# Patient Record
Sex: Female | Born: 1955 | Race: White | Hispanic: No | Smoking: Current every day smoker
Health system: Southern US, Community
[De-identification: ages and names within clinical notes are randomized; demographics above are authoritative.]

## PROBLEM LIST (undated history)

## (undated) DIAGNOSIS — I10 Essential (primary) hypertension: Secondary | ICD-10-CM

## (undated) DIAGNOSIS — Z9981 Dependence on supplemental oxygen: Secondary | ICD-10-CM

## (undated) DIAGNOSIS — F419 Anxiety disorder, unspecified: Secondary | ICD-10-CM

## (undated) DIAGNOSIS — M722 Plantar fascial fibromatosis: Secondary | ICD-10-CM

## (undated) DIAGNOSIS — E785 Hyperlipidemia, unspecified: Secondary | ICD-10-CM

## (undated) DIAGNOSIS — J449 Chronic obstructive pulmonary disease, unspecified: Secondary | ICD-10-CM

## (undated) DIAGNOSIS — G894 Chronic pain syndrome: Secondary | ICD-10-CM

## (undated) DIAGNOSIS — M1990 Unspecified osteoarthritis, unspecified site: Secondary | ICD-10-CM

## (undated) DIAGNOSIS — E039 Hypothyroidism, unspecified: Secondary | ICD-10-CM

## (undated) HISTORY — DX: Essential (primary) hypertension: I10

## (undated) HISTORY — PX: FOOT SURGERY: SHX648

## (undated) HISTORY — PX: SHOULDER SURGERY: SHX246

## (undated) HISTORY — PX: DILATION AND CURETTAGE OF UTERUS: SHX78

## (undated) HISTORY — DX: Plantar fascial fibromatosis: M72.2

## (undated) HISTORY — PX: RHINOPLASTY: SUR1284

## (undated) HISTORY — DX: Unspecified osteoarthritis, unspecified site: M19.90

## (undated) HISTORY — DX: Hyperlipidemia, unspecified: E78.5

## (undated) HISTORY — PX: TUBAL LIGATION: SHX77

## (undated) HISTORY — DX: Hypothyroidism, unspecified: E03.9

## (undated) HISTORY — PX: OTHER SURGICAL HISTORY: SHX169

## (undated) HISTORY — DX: Anxiety disorder, unspecified: F41.9

## (undated) HISTORY — DX: Chronic obstructive pulmonary disease, unspecified: J44.9

---

## 2005-02-02 ENCOUNTER — Emergency Department: Payer: Self-pay | Admitting: Emergency Medicine

## 2005-03-12 ENCOUNTER — Emergency Department: Payer: Self-pay | Admitting: Internal Medicine

## 2010-03-06 ENCOUNTER — Ambulatory Visit: Payer: Self-pay

## 2011-03-27 ENCOUNTER — Ambulatory Visit: Payer: Self-pay

## 2012-04-01 ENCOUNTER — Ambulatory Visit: Payer: Self-pay | Admitting: Family Medicine

## 2012-12-02 ENCOUNTER — Ambulatory Visit: Payer: Self-pay | Admitting: Anesthesiology

## 2012-12-03 ENCOUNTER — Ambulatory Visit: Payer: Self-pay | Admitting: Otolaryngology

## 2013-02-23 ENCOUNTER — Ambulatory Visit (INDEPENDENT_AMBULATORY_CARE_PROVIDER_SITE_OTHER): Payer: BC Managed Care – PPO | Admitting: Podiatry

## 2013-02-23 ENCOUNTER — Encounter: Payer: Self-pay | Admitting: Podiatry

## 2013-02-23 ENCOUNTER — Ambulatory Visit (INDEPENDENT_AMBULATORY_CARE_PROVIDER_SITE_OTHER): Payer: BC Managed Care – PPO

## 2013-02-23 VITALS — BP 121/76 | HR 78 | Resp 16 | Ht 64.0 in | Wt 173.0 lb

## 2013-02-23 DIAGNOSIS — M79671 Pain in right foot: Secondary | ICD-10-CM

## 2013-02-23 DIAGNOSIS — R609 Edema, unspecified: Secondary | ICD-10-CM

## 2013-02-23 DIAGNOSIS — M79609 Pain in unspecified limb: Secondary | ICD-10-CM

## 2013-02-23 DIAGNOSIS — M779 Enthesopathy, unspecified: Secondary | ICD-10-CM

## 2013-02-23 MED ORDER — TRIAMCINOLONE ACETONIDE 10 MG/ML IJ SUSP
5.0000 mg | Freq: Once | INTRAMUSCULAR | Status: AC
  Administered 2013-02-23: 5 mg via INTRA_ARTICULAR

## 2013-02-23 NOTE — Progress Notes (Signed)
Subjective:     Patient ID: Audrey Campbell, female   DOB: 04-08-1956, 57 y.o.   MRN: [id number]  HPI patient presents stating my feet ache after I work and I have trouble walking on cement floors. Patient states the heels do not feel like a used to but her feet ache in general and her ankles are also hurting   Review of Systems  All other systems reviewed and are negative.       Objective:   Physical Exam  Nursing note and vitals reviewed. Constitutional: She is oriented to person, place, and time.  Cardiovascular: Intact distal pulses.   Musculoskeletal: Normal range of motion.  Neurological: She is oriented to person, place, and time.  Skin: Skin is warm.   range of motion was adequate with minimal discomfort at the medial calcaneal tubercle insertion to the calcaneus. Incision sites have healed well and the pain is more generalized with specific pain within the sinus tarsi bilateral     Assessment:     Continue discomfort post endoscopic release with improvement around the insertional point of the calcaneus    Plan:     X-rays reviewed with patient and continuation of meds encouraged. Sterile prep and injection of the sinus tarsi bilateral 3 mg Kenalog 5 mg Xylocaine Marcaine mixture and I dispensed fascial wraps to use for the next 6 weeks. Reappoint at that time

## 2013-02-26 ENCOUNTER — Telehealth: Payer: Self-pay | Admitting: *Deleted

## 2013-02-26 MED ORDER — HYDROCODONE-ACETAMINOPHEN 10-325 MG PO TABS: 1.0000 | ORAL_TABLET | Freq: Three times a day (TID) | ORAL | Status: DC | PRN

## 2013-02-26 NOTE — Telephone Encounter (Signed)
PT LEFT VOICEMAIL STATING THAT SHE CANT GET HER RX REFILL BECAUSE OF THE NEW LAWS , SHE NEEDS A HARD COPY TO BRING TO THE PHARMACIST . IT IS FOR VICODIN 10/325 . RX WAS WRITTEN ON 9.26.14 WITH A REFILL. PLEASE WRITE A HARD COPY FOR HER TO TAKE TO THE PHARMACY .

## 2013-02-28 NOTE — Telephone Encounter (Signed)
Taken care of on friday

## 2013-04-02 ENCOUNTER — Ambulatory Visit: Payer: BC Managed Care – PPO | Admitting: Podiatry

## 2013-05-03 ENCOUNTER — Ambulatory Visit: Payer: Self-pay | Admitting: Family Medicine

## 2013-07-21 ENCOUNTER — Ambulatory Visit: Payer: Self-pay | Admitting: Family Medicine

## 2014-05-18 ENCOUNTER — Ambulatory Visit: Payer: Self-pay

## 2015-03-22 ENCOUNTER — Ambulatory Visit: Payer: Self-pay | Attending: Oncology

## 2015-04-12 ENCOUNTER — Ambulatory Visit: Payer: Self-pay

## 2015-06-12 ENCOUNTER — Other Ambulatory Visit: Payer: Self-pay | Admitting: Family Medicine

## 2015-06-12 DIAGNOSIS — Z1231 Encounter for screening mammogram for malignant neoplasm of breast: Secondary | ICD-10-CM

## 2015-06-15 ENCOUNTER — Ambulatory Visit
Admission: RE | Admit: 2015-06-15 | Discharge: 2015-06-15 | Disposition: A | Discharge status: Home / Self Care | Payer: BLUE CROSS/BLUE SHIELD | Source: Ambulatory Visit | Attending: Family Medicine | Admitting: Family Medicine

## 2015-06-15 DIAGNOSIS — Z1231 Encounter for screening mammogram for malignant neoplasm of breast: Secondary | ICD-10-CM

## 2015-07-27 ENCOUNTER — Ambulatory Visit
Admission: RE | Admit: 2015-07-27 | Discharge: 2015-07-27 | Disposition: A | Discharge status: Home / Self Care | Payer: BLUE CROSS/BLUE SHIELD | Source: Ambulatory Visit | Attending: Family Medicine | Admitting: Family Medicine

## 2015-07-27 ENCOUNTER — Other Ambulatory Visit: Payer: Self-pay | Admitting: Family Medicine

## 2015-07-27 DIAGNOSIS — R059 Cough, unspecified: Secondary | ICD-10-CM

## 2015-07-27 DIAGNOSIS — R05 Cough: Secondary | ICD-10-CM | POA: Diagnosis not present

## 2015-07-27 DIAGNOSIS — R0602 Shortness of breath: Secondary | ICD-10-CM | POA: Diagnosis not present

## 2016-03-06 ENCOUNTER — Other Ambulatory Visit: Payer: Self-pay | Admitting: Family Medicine

## 2016-03-06 DIAGNOSIS — R51 Headache: Principal | ICD-10-CM

## 2016-03-06 DIAGNOSIS — R519 Headache, unspecified: Secondary | ICD-10-CM

## 2016-03-12 ENCOUNTER — Ambulatory Visit
Admission: RE | Admit: 2016-03-12 | Discharge: 2016-03-12 | Disposition: A | Discharge status: Home / Self Care | Payer: BLUE CROSS/BLUE SHIELD | Source: Ambulatory Visit | Attending: Family Medicine | Admitting: Family Medicine

## 2016-03-12 DIAGNOSIS — R51 Headache: Secondary | ICD-10-CM | POA: Diagnosis not present

## 2016-03-12 DIAGNOSIS — R519 Headache, unspecified: Secondary | ICD-10-CM

## 2016-05-17 ENCOUNTER — Other Ambulatory Visit: Payer: Self-pay | Admitting: Family Medicine

## 2016-05-17 DIAGNOSIS — Z1231 Encounter for screening mammogram for malignant neoplasm of breast: Secondary | ICD-10-CM

## 2016-06-17 ENCOUNTER — Ambulatory Visit: Payer: BLUE CROSS/BLUE SHIELD

## 2016-07-29 ENCOUNTER — Ambulatory Visit
Admission: RE | Admit: 2016-07-29 | Discharge: 2016-07-29 | Disposition: A | Discharge status: Home / Self Care | Payer: BLUE CROSS/BLUE SHIELD | Source: Ambulatory Visit | Attending: Family Medicine | Admitting: Family Medicine

## 2016-07-29 DIAGNOSIS — Z1231 Encounter for screening mammogram for malignant neoplasm of breast: Secondary | ICD-10-CM | POA: Insufficient documentation

## 2016-08-22 ENCOUNTER — Telehealth: Payer: Self-pay | Admitting: Podiatry

## 2016-08-22 ENCOUNTER — Encounter: Payer: Self-pay | Admitting: Podiatry

## 2016-08-22 NOTE — Telephone Encounter (Signed)
Called patient and let her know the status of her medical records request and to call me back to confirm if she wants her medical records I have access to.

## 2016-08-22 NOTE — Progress Notes (Signed)
Patients requested medical records were mailed to her on Thursday 22 August 2016.

## 2016-08-28 LAB — HM HEPATITIS C SCREENING LAB: HM Hepatitis Screen: NEGATIVE

## 2017-06-10 ENCOUNTER — Other Ambulatory Visit: Payer: Self-pay | Admitting: Family Medicine

## 2017-06-10 DIAGNOSIS — Z1231 Encounter for screening mammogram for malignant neoplasm of breast: Secondary | ICD-10-CM

## 2017-07-16 ENCOUNTER — Ambulatory Visit (INDEPENDENT_AMBULATORY_CARE_PROVIDER_SITE_OTHER): Payer: BLUE CROSS/BLUE SHIELD | Admitting: Certified Nurse Midwife

## 2017-07-16 ENCOUNTER — Encounter: Payer: Self-pay | Admitting: Certified Nurse Midwife

## 2017-07-16 VITALS — BP 124/80 | HR 86 | Ht 64.5 in | Wt 203.5 lb

## 2017-07-16 DIAGNOSIS — E66811 Obesity, class 1: Secondary | ICD-10-CM

## 2017-07-16 DIAGNOSIS — Z87891 Personal history of nicotine dependence: Secondary | ICD-10-CM | POA: Insufficient documentation

## 2017-07-16 DIAGNOSIS — E669 Obesity, unspecified: Secondary | ICD-10-CM

## 2017-07-16 DIAGNOSIS — M722 Plantar fascial fibromatosis: Secondary | ICD-10-CM | POA: Diagnosis not present

## 2017-07-16 DIAGNOSIS — Z6834 Body mass index (BMI) 34.0-34.9, adult: Secondary | ICD-10-CM | POA: Diagnosis not present

## 2017-07-16 DIAGNOSIS — Z0000 Encounter for general adult medical examination without abnormal findings: Secondary | ICD-10-CM | POA: Diagnosis not present

## 2017-07-16 DIAGNOSIS — F17200 Nicotine dependence, unspecified, uncomplicated: Secondary | ICD-10-CM

## 2017-07-16 DIAGNOSIS — E039 Hypothyroidism, unspecified: Secondary | ICD-10-CM | POA: Diagnosis not present

## 2017-07-16 NOTE — Progress Notes (Signed)
GYNECOLOGY ANNUAL PREVENTATIVE CARE ENCOUNTER NOTE  Subjective:   Audrey Campbell is a 62 y.o. No obstetric history on file. female here for a routine annual gynecologic exam.  Current complaints: none.   Denies abnormal vaginal bleeding, discharge, pelvic pain, problems with intercourse or other gynecologic concerns.    Gynecologic History No LMP recorded. Patient is postmenopausal. Contraception: tubal ligation, postmenopausal  Last Pap: 2018 Dr. Brunetta Genera office. Results were: normal per pt, request medical records Last mammogram: 07/2016. Results were: normal  Obstetric History OB History  No data available    Past Medical History:  Diagnosis Date  . Anxiety   . HBP (high blood pressure)     Past Surgical History:  Procedure Laterality Date  . FOOT SURGERY Bilateral   . RHINOPLASTY      Current Outpatient Medications on File Prior to Visit  Medication Sig Dispense Refill  . ALBUTEROL IN Inhale into the lungs. TWO PUFFS DAILY AS NEEDED    . Calcium Carb-Cholecalciferol (CALCIUM 1000 + D PO) Take by mouth daily.    . Cholecalciferol (VITAMIN D-3) 5000 UNITS TABS Take by mouth daily.    . clonazePAM (KLONOPIN) 0.5 MG tablet Take 0.5 mg by mouth 2 (two) times daily as needed for anxiety.    . gabapentin (NEURONTIN) 400 MG capsule Take 400 mg by mouth 3 (three) times daily.    Marland Kitchen HYDROcodone-acetaminophen (NORCO) 10-325 MG per tablet Take 1 tablet by mouth every 8 (eight) hours as needed for pain. 30 tablet 0  . HYDROcodone-acetaminophen (NORCO/VICODIN) 5-325 MG per tablet Take 1 tablet by mouth every 4 (four) hours as needed for pain.    . meloxicam (MOBIC) 7.5 MG tablet Take 7.5 mg by mouth 2 (two) times daily.    . methocarbamol (ROBAXIN) 500 MG tablet Take 500 mg by mouth 3 (three) times daily.    . nortriptyline (PAMELOR) 25 MG capsule Take 25 mg by mouth at bedtime.    Marland Kitchen olmesartan (BENICAR) 20 MG tablet Take 20 mg by mouth daily.    . rosuvastatin (CRESTOR) 10 MG  tablet Take 10 mg by mouth daily.     No current facility-administered medications on file prior to visit.     Allergies  Allergen Reactions  . Penicillins Rash    Social History   Socioeconomic History  . Marital status: Widowed    Spouse name: Not on file  . Number of children: Not on file  . Years of education: Not on file  . Highest education level: Not on file  Social Needs  . Financial resource strain: Not on file  . Food insecurity - worry: Not on file  . Food insecurity - inability: Not on file  . Transportation needs - medical: Not on file  . Transportation needs - non-medical: Not on file  Occupational History  . Not on file  Tobacco Use  . Smoking status: Current Every Day Smoker    Packs/day: 0.50    Types: Cigarettes  . Smokeless tobacco: Never Used  Substance and Sexual Activity  . Alcohol use: No  . Drug use: No  . Sexual activity: Not on file  Other Topics Concern  . Not on file  Social History Narrative  . Not on file    Family History  Problem Relation Age of Onset  . Breast cancer Neg Hx     The following portions of the patient's history were reviewed and updated as appropriate: allergies, current medications, past family history, past medical history,  past social history, past surgical history and problem list.  Review of Systems Review of Systems  Constitutional: Negative.   HENT: Negative.   Eyes: Negative.   Respiratory: Positive for wheezing.   Cardiovascular: Negative.   Gastrointestinal: Positive for constipation.  Genitourinary: Negative.   Musculoskeletal: Positive for back pain.  Skin: Negative.   Neurological: Negative.   Endo/Heme/Allergies: Negative.   Psychiatric/Behavioral: Negative.       Objective:  There were no vitals taken for this visit. CONSTITUTIONAL: Well-developed, well-nourished obese female in no acute distress.  HENT:  Normocephalic, atraumatic, External right and left ear normal. Oropharynx is clear  and moist EYES: Conjunctivae and EOM are normal. Pupils are equal, round, and reactive to light. No scleral icterus.  NECK: Normal range of motion, supple, no masses.  Normal thyroid.  SKIN: Skin is warm and dry. No rash noted. Not diaphoretic. No erythema. No pallor. NEUROLOGIC: Alert and oriented to person, place, and time. Normal reflexes, muscle tone coordination. No cranial nerve deficit noted. PSYCHIATRIC: Normal mood and affect. Normal behavior. Normal judgment and thought content. CARDIOVASCULAR: Normal heart rate noted, regular rhythm RESPIRATORY: wheezing to auscultation bilaterally. Effort and breath sounds normal, no problems with respiration noted.Pink BREASTS: Symmetric in size. No masses, skin changes, nipple drainage, or lymphadenopathy. ABDOMEN: Soft, normal bowel sounds, no distention noted.  No tenderness, rebound or guarding.  PELVIC: Normal appearing external genitalia;  vaginal atrophy  No abnormal discharge noted.  Pap smear not indicated.  Normal uterine size, no other palpable masses, no uterine or adnexal tenderness. MUSCULOSKELETAL: Normal range of motion. No tenderness.  No cyanosis, clubbing, or edema.  2+ distal pulses.   Assessment and Plan:  1. Annual physical exam  Pap smear not indicated. Request medical records for last years results. Mammogram scheduled on 07/2017 Colonoscopy scheduled Pt denies pain with intercourse or vaginal symptoms Labs: done at primary care Encourage exercise and diet changes for weight loss Routine preventative health maintenance measures emphasized. Please refer to After Visit Summary for other counseling recommendations.    Philip Aspen, CNM  [facility]

## 2017-07-16 NOTE — Progress Notes (Signed)
New pt is here for an annual exam. Has a mammogram scheduled next month. No history of abnormal paps.

## 2017-07-16 NOTE — Patient Instructions (Addendum)
Preventive Care 40-64 Years, Female Preventive care refers to lifestyle choices and visits with your health care provider that can promote health and wellness. What does preventive care include?  A yearly physical exam. This is also called an annual well check.  Dental exams once or twice a year.  Routine eye exams. Ask your health care provider how often you should have your eyes checked.  Personal lifestyle choices, including: ? Daily care of your teeth and gums. ? Regular physical activity. ? Eating a healthy diet. ? Avoiding tobacco and drug use. ? Limiting alcohol use. ? Practicing safe sex. ? Taking low-dose aspirin daily starting at age 58. ? Taking vitamin and mineral supplements as recommended by your health care provider. What happens during an annual well check? The services and screenings done by your health care provider during your annual well check will depend on your age, overall health, lifestyle risk factors, and family history of disease. Counseling Your health care provider may ask you questions about your:  Alcohol use.  Tobacco use.  Drug use.  Emotional well-being.  Home and relationship well-being.  Sexual activity.  Eating habits.  Work and work Statistician.  Method of birth control.  Menstrual cycle.  Pregnancy history.  Screening You may have the following tests or measurements:  Height, weight, and BMI.  Blood pressure.  Lipid and cholesterol levels. These may be checked every 5 years, or more frequently if you are over 81 years old.  Skin check.  Lung cancer screening. You may have this screening every year starting at age 78 if you have a 30-pack-year history of smoking and currently smoke or have quit within the past 15 years.  Fecal occult blood test (FOBT) of the stool. You may have this test every year starting at age 65.  Flexible sigmoidoscopy or colonoscopy. You may have a sigmoidoscopy every 5 years or a colonoscopy  every 10 years starting at age 30.  Hepatitis C blood test.  Hepatitis B blood test.  Sexually transmitted disease (STD) testing.  Diabetes screening. This is done by checking your blood sugar (glucose) after you have not eaten for a while (fasting). You may have this done every 1-3 years.  Mammogram. This may be done every 1-2 years. Talk to your health care provider about when you should start having regular mammograms. This may depend on whether you have a family history of breast cancer.  BRCA-related cancer screening. This may be done if you have a family history of breast, ovarian, tubal, or peritoneal cancers.  Pelvic exam and Pap test. This may be done every 3 years starting at age 80. Starting at age 36, this may be done every 5 years if you have a Pap test in combination with an HPV test.  Bone density scan. This is done to screen for osteoporosis. You may have this scan if you are at high risk for osteoporosis.  Discuss your test results, treatment options, and if necessary, the need for more tests with your health care provider. Vaccines Your health care provider may recommend certain vaccines, such as:  Influenza vaccine. This is recommended every year.  Tetanus, diphtheria, and acellular pertussis (Tdap, Td) vaccine. You may need a Td booster every 10 years.  Varicella vaccine. You may need this if you have not been vaccinated.  Zoster vaccine. You may need this after age 5.  Measles, mumps, and rubella (MMR) vaccine. You may need at least one dose of MMR if you were born in  1958 or later. You may also need a second dose.  Pneumococcal 13-valent conjugate (PCV13) vaccine. You may need this if you have certain conditions and were not previously vaccinated.  Pneumococcal polysaccharide (PPSV23) vaccine. You may need one or two doses if you smoke cigarettes or if you have certain conditions.  Meningococcal vaccine. You may need this if you have certain  conditions.  Hepatitis A vaccine. You may need this if you have certain conditions or if you travel or work in places where you may be exposed to hepatitis A.  Hepatitis B vaccine. You may need this if you have certain conditions or if you travel or work in places where you may be exposed to hepatitis B.  Haemophilus influenzae type b (Hib) vaccine. You may need this if you have certain conditions.  Talk to your health care provider about which screenings and vaccines you need and how often you need them. This information is not intended to replace advice given to you by your health care provider. Make sure you discuss any questions you have with your health care provider. Document Released: 05/12/2015 Document Revised: 01/03/2016 Document Reviewed: 02/14/2015 Elsevier Interactive Patient Education  2018 [employer].  Smoking Tobacco Information Smoking tobacco will very likely harm your health. Tobacco contains a poisonous (toxic), colorless chemical called nicotine. Nicotine affects the brain and makes tobacco addictive. This change in your brain can make it hard to stop smoking. Tobacco also has other toxic chemicals that can hurt your body and raise your risk of many cancers. How can smoking tobacco affect me? Smoking tobacco can increase your chances of having serious health conditions, such as:  Cancer. Smoking is most commonly associated with lung cancer, but can lead to cancer in other parts of the body.  Chronic obstructive pulmonary disease (COPD). This is a long-term lung condition that makes it hard to breathe. It also gets worse over time.  High blood pressure (hypertension), heart disease, stroke, or heart attack.  Lung infections, such as pneumonia.  Cataracts. This is when the lenses in the eyes become clouded.  Digestive problems. This may include peptic ulcers, heartburn, and gastroesophageal reflux disease (GERD).  Oral health problems, such as gum disease and tooth  loss.  Loss of taste and smell.  Smoking can affect your appearance by causing:  Wrinkles.  Yellow or stained teeth, fingers, and fingernails.  Smoking tobacco can also affect your social life.  Many workplaces, [employer], hotels, and public places are tobacco-free. This means that you may experience challenges in finding places to smoke when away from home.  The cost of a smoking habit can be expensive. Expenses for someone who smokes come in two ways: ? You spend money on a regular basis to buy tobacco. ? Your health care costs in the long-term are higher if you smoke.  Tobacco smoke can also affect the health of those around you. Children of smokers have greater chances of: ? Sudden infant death syndrome (SIDS). ? Ear infections. ? Lung infections.  What lifestyle changes can be made?  Do not start smoking. Quit if you already do.  To quit smoking: ? Make a plan to quit smoking and commit yourself to it. Look for programs to help you and ask your health care provider for recommendations and ideas. ? Talk with your health care provider about using nicotine replacement medicines to help you quit. Medicine replacement medicines include gum, lozenges, patches, sprays, or pills. ? Do not replace cigarette smoking with electronic cigarettes,  which are commonly called e-cigarettes. The safety of e-cigarettes is not known, and some may contain harmful chemicals. ? Avoid places, people, or situations that tempt you to smoke. ? If you try to quit but return to smoking, don't give up hope. It is very common for people to try a number of times before they fully succeed. When you feel ready again, give it another try.  Quitting smoking might affect the way you eat as well as your weight. Be prepared to monitor your eating habits. Get support in planning and following a healthy diet.  Ask your health care provider about having regular tests (screenings) to check for cancer. This may  include blood tests, imaging tests, and other tests.  Exercise regularly. Consider taking walks, joining a gym, or doing yoga or exercise classes.  Develop skills to manage your stress. These skills include meditation. What are the benefits of quitting smoking? By quitting smoking, you may:  Lower your risk of getting cancer and other diseases caused by smoking.  Live longer.  Breathe better.  Lower your blood pressure and heart rate.  Stop your addiction to tobacco.  Stop creating secondhand smoke that hurts other people.  Improve your sense of taste and smell.  Look better over time, due to having fewer wrinkles and less staining.  What can happen if changes are not made? If you do not stop smoking, you may:  Get cancer and other diseases.  Develop COPD or other long-term (chronic) lung conditions.  Develop serious problems with your heart and blood vessels (cardiovascular system).  Need more tests to screen for problems caused by smoking.  Have higher, long-term healthcare costs from medicines or treatments related to smoking.  Continue to have worsening changes in your lungs, mouth, and nose.  Where to find support: To get support to quit smoking, consider:  Asking your health care provider for more information and resources.  Taking classes to learn more about quitting smoking.  Looking for local organizations that offer resources about quitting smoking.  Joining a support group for people who want to quit smoking in your local community.  Where to find more information: You may find more information about quitting smoking from:  HelpGuide.org: www.helpguide.org/articles/addictions/how-to-quit-smoking.htm  https://hall.com/: smokefree.gov  American Lung Association: www.lung.org  Contact a health care provider if:  You have problems breathing.  Your lips, nose, or fingers turn blue.  You have chest pain.  You are coughing up blood.  You feel faint  or you pass out.  You have other noticeable changes that cause you to worry. Summary  Smoking tobacco can negatively affect your health, the health of those around you, your finances, and your social life.  Do not start smoking. Quit if you already do. If you need help quitting, ask your health care provider.  Think about joining a support group for people who want to quit smoking in your local community. There are many effective programs that will help you to quit this behavior. This information is not intended to replace advice given to you by your health care provider. Make sure you discuss any questions you have with your health care provider. Document Released: 04/30/2016 Document Revised: 04/30/2016 Document Reviewed: 04/30/2016 Elsevier Interactive Patient Education  2018 [place] [employer] Calories are units of energy. Your body needs a certain amount of calories from food to keep you going throughout the day. When you eat more calories than your body needs, your body stores the  extra calories as fat. When you eat fewer calories than your body needs, your body burns fat to get the energy it needs. Calorie counting means keeping track of how many calories you eat and drink each day. Calorie counting can be helpful if you need to lose weight. If you make sure to eat fewer calories than your body needs, you should lose weight. Ask your health care provider what a healthy weight is for you. For calorie counting to work, you will need to eat the right number of calories in a day in order to lose a healthy amount of weight per week. A dietitian can help you determine how many calories you need in a day and will give you suggestions on how to reach your calorie goal.  A healthy amount of weight to lose per week is usually 1-2 lb (0.5-0.9 kg). This usually means that your daily calorie intake should be reduced by 500-750 calories.  Eating 1,200 - 1,500 calories per  day can help most women lose weight.  Eating 1,500 - 1,800 calories per day can help most men lose weight.  What is my plan? My goal is to have __________ calories per day. If I have this many calories per day, I should lose around __________ pounds per week. What do I need to know about calorie counting? In order to meet your daily calorie goal, you will need to:  Find out how many calories are in each food you would like to eat. Try to do this before you eat.  Decide how much of the food you plan to eat.  Write down what you ate and how many calories it had. Doing this is called keeping a food log.  To successfully lose weight, it is important to balance calorie counting with a healthy lifestyle that includes regular activity. Aim for 150 minutes of moderate exercise (such as walking) or 75 minutes of vigorous exercise (such as running) each week. Where do I find calorie information?  The number of calories in a food can be found on a Nutrition Facts label. If a food does not have a Nutrition Facts label, try to look up the calories online or ask your dietitian for help. Remember that calories are listed per serving. If you choose to have more than one serving of a food, you will have to multiply the calories per serving by the amount of servings you plan to eat. For example, the label on a package of bread might say that a serving size is 1 slice and that there are 90 calories in a serving. If you eat 1 slice, you will have eaten 90 calories. If you eat 2 slices, you will have eaten 180 calories. How do I keep a food log? Immediately after each meal, record the following information in your food log:  What you ate. Don't forget to include toppings, sauces, and other extras on the food.  How much you ate. This can be measured in cups, ounces, or number of items.  How many calories each food and drink had.  The total number of calories in the meal.  Keep your food log near you, such  as in a small notebook in your pocket, or use a mobile app or website. Some programs will calculate calories for you and show you how many calories you have left for the day to meet your goal. What are some calorie counting tips?  Use your calories on foods and drinks that will fill  you up and not leave you hungry: ? Some examples of foods that fill you up are nuts and nut butters, vegetables, lean proteins, and high-fiber foods like whole grains. High-fiber foods are foods with more than 5 g fiber per serving. ? Drinks such as sodas, specialty coffee drinks, alcohol, and juices have a lot of calories, yet do not fill you up.  Eat nutritious foods and avoid empty calories. Empty calories are calories you get from foods or beverages that do not have many vitamins or protein, such as candy, sweets, and soda. It is better to have a nutritious high-calorie food (such as an avocado) than a food with few nutrients (such as a bag of chips).  Know how many calories are in the foods you eat most often. This will help you calculate calorie counts faster.  Pay attention to calories in drinks. Low-calorie drinks include water and unsweetened drinks.  Pay attention to nutrition labels for "low fat" or "fat free" foods. These foods sometimes have the same amount of calories or more calories than the full fat versions. They also often have added sugar, starch, or salt, to make up for flavor that was removed with the fat.  Find a way of tracking calories that works for you. Get creative. Try different apps or programs if writing down calories does not work for you. What are some portion control tips?  Know how many calories are in a serving. This will help you know how many servings of a certain food you can have.  Use a measuring cup to measure serving sizes. You could also try weighing out portions on a kitchen scale. With time, you will be able to estimate serving sizes for some foods.  Take some time to  put servings of different foods on your favorite plates, bowls, and cups so you know what a serving looks like.  Try not to eat straight from a bag or box. Doing this can lead to overeating. Put the amount you would like to eat in a cup or on a plate to make sure you are eating the right portion.  Use smaller plates, glasses, and bowls to prevent overeating.  Try not to multitask (for example, watch TV or use your computer) while eating. If it is time to eat, sit down at a table and enjoy your food. This will help you to know when you are full. It will also help you to be aware of what you are eating and how much you are eating. What are tips for following this plan? Reading food labels  Check the calorie count compared to the serving size. The serving size may be smaller than what you are used to eating.  Check the source of the calories. Make sure the food you are eating is high in vitamins and protein and low in saturated and trans fats. Shopping  Read nutrition labels while you shop. This will help you make healthy decisions before you decide to purchase your food.  Make a grocery list and stick to it. Cooking  Try to cook your favorite foods in a healthier way. For example, try baking instead of frying.  Use low-fat dairy products. Meal planning  Use more fruits and vegetables. Half of your plate should be fruits and vegetables.  Include lean proteins like poultry and fish. How do I count calories when eating out?  Ask for smaller portion sizes.  Consider sharing an entree and sides instead of getting your own entree.  If you get your own entree, eat only half. Ask for a box at the beginning of your meal and put the rest of your entree in it so you are not tempted to eat it.  If calories are listed on the menu, choose the lower calorie options.  Choose dishes that include vegetables, fruits, whole grains, low-fat dairy products, and lean protein.  Choose items that are  boiled, broiled, grilled, or steamed. Stay away from items that are buttered, battered, fried, or served with cream sauce. Items labeled "crispy" are usually fried, unless stated otherwise.  Choose water, low-fat milk, unsweetened iced tea, or other drinks without added sugar. If you want an alcoholic beverage, choose a lower calorie option such as a glass of wine or light beer.  Ask for dressings, sauces, and syrups on the side. These are usually high in calories, so you should limit the amount you eat.  If you want a salad, choose a garden salad and ask for grilled meats. Avoid extra toppings like bacon, cheese, or fried items. Ask for the dressing on the side, or ask for olive oil and vinegar or lemon to use as dressing.  Estimate how many servings of a food you are given. For example, a serving of cooked rice is  cup or about the size of half a baseball. Knowing serving sizes will help you be aware of how much food you are eating at restaurants. The list below tells you how big or small some common portion sizes are based on everyday objects: ? 1 oz-4 stacked dice. ? 3 oz-1 deck of cards. ? 1 tsp-1 die. ? 1 Tbsp- a ping-pong ball. ? 2 Tbsp-1 ping-pong ball. ?  cup- baseball. ? 1 cup-1 baseball. Summary  Calorie counting means keeping track of how many calories you eat and drink each day. If you eat fewer calories than your body needs, you should lose weight.  A healthy amount of weight to lose per week is usually 1-2 lb (0.5-0.9 kg). This usually means reducing your daily calorie intake by 500-750 calories.  The number of calories in a food can be found on a Nutrition Facts label. If a food does not have a Nutrition Facts label, try to look up the calories online or ask your dietitian for help.  Use your calories on foods and drinks that will fill you up, and not on foods and drinks that will leave you hungry.  Use smaller plates, glasses, and bowls to prevent overeating. This  information is not intended to replace advice given to you by your health care provider. Make sure you discuss any questions you have with your health care provider. Document Released: 04/15/2005 Document Revised: 03/15/2016 Document Reviewed: 03/15/2016 Elsevier Interactive Patient Education  [employer].

## 2017-07-21 ENCOUNTER — Telehealth: Payer: Self-pay | Admitting: Gastroenterology

## 2017-07-21 NOTE — Telephone Encounter (Signed)
Gastroenterology Pre-Procedure Review  Request Date:   Requesting Physician: Dr.    PATIENT REVIEW QUESTIONS: The patient responded to the following health history questions as indicated:    1. Are you having any GI issues? no 2. Do you have a personal history of Polyps? no 3. Do you have a family history of Colon Cancer or Polyps? no 4. Diabetes Mellitus? no 5. Joint replacements in the past 12 months?no 6. Major health problems in the past 3 months?no 7. Any artificial heart valves, MVP, or defibrillator?no    MEDICATIONS & ALLERGIES:    Patient reports the following regarding taking any anticoagulation/antiplatelet therapy:   Plavix, Coumadin, Eliquis, Xarelto, Lovenox, Pradaxa, Brilinta, or Effient? no Aspirin? no  Patient confirms/reports the following medications:  Current Outpatient Medications  Medication Sig Dispense Refill   albuterol (PROVENTIL HFA) 108 (90 Base) MCG/ACT inhaler Inhale into the lungs.     ALBUTEROL IN Inhale into the lungs. TWO PUFFS DAILY AS NEEDED     AMITIZA 24 MCG capsule   4   amLODipine (NORVASC) 10 MG tablet   4   budesonide-formoterol (SYMBICORT) 160-4.5 MCG/ACT inhaler Inhale into the lungs.     butalbital-acetaminophen-caffeine (FIORICET WITH CODEINE) 50-325-40-30 MG capsule Take 1 capsule by mouth every 4 (four) hours as needed for headache.     Calcium Carb-Cholecalciferol (CALCIUM 1000 + D PO) Take by mouth daily.     CHANTIX STARTING MONTH PAK 0.5 MG X 11 & 1 MG X 42 tablet   0   Cholecalciferol (VITAMIN D-3) 5000 UNITS TABS Take by mouth daily.     clonazePAM (KLONOPIN) 0.5 MG tablet Take 0.5 mg by mouth 2 (two) times daily as needed for anxiety.     cyclobenzaprine (FLEXERIL) 10 MG tablet   4   gabapentin (NEURONTIN) 400 MG capsule Take 400 mg by mouth 3 (three) times daily.     HYDROcodone-acetaminophen (NORCO) 10-325 MG per tablet Take 1 tablet by mouth every 8 (eight) hours as needed for pain. 30 tablet 0   levothyroxine  (SYNTHROID, LEVOTHROID) 150 MCG tablet   4   meloxicam (MOBIC) 7.5 MG tablet Take 7.5 mg by mouth 2 (two) times daily.     montelukast (SINGULAIR) 10 MG tablet   4   QUEtiapine (SEROQUEL) 100 MG tablet   4   SPIRIVA HANDIHALER 18 MCG inhalation capsule   4   SYMBICORT 160-4.5 MCG/ACT inhaler   4   VENTOLIN HFA 108 (90 Base) MCG/ACT inhaler   4   No current facility-administered medications for this visit.     Patient confirms/reports the following allergies:  Allergies  Allergen Reactions   Penicillins Rash    No orders of the defined types were placed in this encounter.   AUTHORIZATION INFORMATION Primary Insurance: 1D#: Group #:  Secondary Insurance: 1D#: Group #:  SCHEDULE INFORMATION: Date:  Time: Location:

## 2017-07-25 ENCOUNTER — Other Ambulatory Visit: Payer: Self-pay

## 2017-07-25 ENCOUNTER — Telehealth: Payer: Self-pay

## 2017-07-25 DIAGNOSIS — Z1211 Encounter for screening for malignant neoplasm of colon: Secondary | ICD-10-CM

## 2017-07-25 NOTE — Telephone Encounter (Signed)
Gastroenterology Pre-Procedure Review  Request Date:  Requesting Physician: Dr.   PATIENT REVIEW QUESTIONS: The patient responded to the following health history questions as indicated:    1. Are you having any GI issues? no 2. Do you have a personal history of Polyps? no 3. Do you have a family history of Colon Cancer or Polyps? no 4. Diabetes Mellitus? no 5. Joint replacements in the past 12 months?no 6. Major health problems in the past 3 months?no 7. Any artificial heart valves, MVP, or defibrillator?no    MEDICATIONS & ALLERGIES:    Patient reports the following regarding taking any anticoagulation/antiplatelet therapy:   Plavix, Coumadin, Eliquis, Xarelto, Lovenox, Pradaxa, Brilinta, or Effient? no Aspirin? no  Patient confirms/reports the following medications:  Current Outpatient Medications  Medication Sig Dispense Refill  . albuterol (PROVENTIL HFA) 108 (90 Base) MCG/ACT inhaler Inhale into the lungs.    . ALBUTEROL IN Inhale into the lungs. TWO PUFFS DAILY AS NEEDED    . AMITIZA 24 MCG capsule   4  . amLODipine (NORVASC) 10 MG tablet   4  . budesonide-formoterol (SYMBICORT) 160-4.5 MCG/ACT inhaler Inhale into the lungs.    . butalbital-acetaminophen-caffeine (FIORICET WITH CODEINE) 50-325-40-30 MG capsule Take 1 capsule by mouth every 4 (four) hours as needed for headache.    . Calcium Carb-Cholecalciferol (CALCIUM 1000 + D PO) Take by mouth daily.    . CHANTIX STARTING MONTH PAK 0.5 MG X 11 & 1 MG X 42 tablet   0  . Cholecalciferol (VITAMIN D-3) 5000 UNITS TABS Take by mouth daily.    . clonazePAM (KLONOPIN) 0.5 MG tablet Take 0.5 mg by mouth 2 (two) times daily as needed for anxiety.    . cyclobenzaprine (FLEXERIL) 10 MG tablet   4  . gabapentin (NEURONTIN) 400 MG capsule Take 400 mg by mouth 3 (three) times daily.    Marland Kitchen HYDROcodone-acetaminophen (NORCO) 10-325 MG per tablet Take 1 tablet by mouth every 8 (eight) hours as needed for pain. 30 tablet 0  . levothyroxine  (SYNTHROID, LEVOTHROID) 150 MCG tablet   4  . meloxicam (MOBIC) 7.5 MG tablet Take 7.5 mg by mouth 2 (two) times daily.    . montelukast (SINGULAIR) 10 MG tablet   4  . QUEtiapine (SEROQUEL) 100 MG tablet   4  . SPIRIVA HANDIHALER 18 MCG inhalation capsule   4  . SYMBICORT 160-4.5 MCG/ACT inhaler   4  . VENTOLIN HFA 108 (90 Base) MCG/ACT inhaler   4   No current facility-administered medications for this visit.     Patient confirms/reports the following allergies:  Allergies  Allergen Reactions  . Penicillins Rash    No orders of the defined types were placed in this encounter.   AUTHORIZATION INFORMATION Primary Insurance: 1D#: Group #:  Secondary Insurance: 1D#: Group #:  SCHEDULE INFORMATION: Date: 07/31/17 Time: Location: [facility]

## 2017-07-28 ENCOUNTER — Telehealth: Payer: Self-pay | Admitting: Gastroenterology

## 2017-07-28 NOTE — Telephone Encounter (Signed)
Patient needs to cancel her procedure on 07/31/17 and will call back later to reschedule.

## 2017-07-30 ENCOUNTER — Encounter: Payer: Self-pay | Admitting: Student

## 2017-07-31 ENCOUNTER — Encounter: Admission: RE | Payer: Self-pay | Source: Ambulatory Visit

## 2017-07-31 ENCOUNTER — Ambulatory Visit
Admission: RE | Admit: 2017-07-31 | Payer: BLUE CROSS/BLUE SHIELD | Source: Ambulatory Visit | Admitting: Gastroenterology

## 2017-07-31 SURGERY — COLONOSCOPY WITH PROPOFOL
Anesthesia: General

## 2017-08-11 ENCOUNTER — Ambulatory Visit
Admission: RE | Admit: 2017-08-11 | Discharge: 2017-08-11 | Disposition: A | Discharge status: Home / Self Care | Payer: BLUE CROSS/BLUE SHIELD | Source: Ambulatory Visit | Attending: Family Medicine | Admitting: Family Medicine

## 2017-08-11 DIAGNOSIS — Z1231 Encounter for screening mammogram for malignant neoplasm of breast: Secondary | ICD-10-CM

## 2017-12-09 ENCOUNTER — Inpatient Hospital Stay
Admission: EM | Admit: 2017-12-09 | Discharge: 2017-12-12 | DRG: 189 | Disposition: A | Discharge status: Home / Self Care | Payer: BLUE CROSS/BLUE SHIELD | Attending: Specialist | Admitting: Specialist

## 2017-12-09 DIAGNOSIS — Z23 Encounter for immunization: Secondary | ICD-10-CM

## 2017-12-09 DIAGNOSIS — Z820 Family history of epilepsy and other diseases of the nervous system: Secondary | ICD-10-CM

## 2017-12-09 DIAGNOSIS — Z7951 Long term (current) use of inhaled steroids: Secondary | ICD-10-CM

## 2017-12-09 DIAGNOSIS — J9601 Acute respiratory failure with hypoxia: Secondary | ICD-10-CM | POA: Diagnosis not present

## 2017-12-09 DIAGNOSIS — R05 Cough: Secondary | ICD-10-CM | POA: Diagnosis not present

## 2017-12-09 DIAGNOSIS — M722 Plantar fascial fibromatosis: Secondary | ICD-10-CM | POA: Diagnosis present

## 2017-12-09 DIAGNOSIS — E785 Hyperlipidemia, unspecified: Secondary | ICD-10-CM | POA: Diagnosis present

## 2017-12-09 DIAGNOSIS — Z79890 Hormone replacement therapy (postmenopausal): Secondary | ICD-10-CM

## 2017-12-09 DIAGNOSIS — Z791 Long term (current) use of non-steroidal anti-inflammatories (NSAID): Secondary | ICD-10-CM

## 2017-12-09 DIAGNOSIS — E039 Hypothyroidism, unspecified: Secondary | ICD-10-CM | POA: Diagnosis present

## 2017-12-09 DIAGNOSIS — Z833 Family history of diabetes mellitus: Secondary | ICD-10-CM

## 2017-12-09 DIAGNOSIS — I10 Essential (primary) hypertension: Secondary | ICD-10-CM | POA: Diagnosis present

## 2017-12-09 DIAGNOSIS — M1990 Unspecified osteoarthritis, unspecified site: Secondary | ICD-10-CM | POA: Diagnosis present

## 2017-12-09 DIAGNOSIS — F17210 Nicotine dependence, cigarettes, uncomplicated: Secondary | ICD-10-CM | POA: Diagnosis present

## 2017-12-09 DIAGNOSIS — Z880 Allergy status to penicillin: Secondary | ICD-10-CM

## 2017-12-09 DIAGNOSIS — F419 Anxiety disorder, unspecified: Secondary | ICD-10-CM | POA: Diagnosis present

## 2017-12-09 DIAGNOSIS — J441 Chronic obstructive pulmonary disease with (acute) exacerbation: Secondary | ICD-10-CM

## 2017-12-09 DIAGNOSIS — G43909 Migraine, unspecified, not intractable, without status migrainosus: Secondary | ICD-10-CM | POA: Diagnosis present

## 2017-12-09 NOTE — ED Triage Notes (Signed)
Pt from home, saw pcp 4 dyas ago, rx levaquin, hx of copd, bronchitis, laborted wheezing, albuterol as prescribed with little relief, got scared and anxious and called 911. no steroid given by pcp. pt is on . 180/100, 12 lead unremarkable, some chest tiughtness. solumedrol given on truck.

## 2017-12-10 ENCOUNTER — Other Ambulatory Visit: Payer: Self-pay

## 2017-12-10 ENCOUNTER — Emergency Department: Payer: BLUE CROSS/BLUE SHIELD

## 2017-12-10 DIAGNOSIS — Z79890 Hormone replacement therapy (postmenopausal): Secondary | ICD-10-CM | POA: Diagnosis not present

## 2017-12-10 DIAGNOSIS — Z880 Allergy status to penicillin: Secondary | ICD-10-CM | POA: Diagnosis not present

## 2017-12-10 DIAGNOSIS — J9601 Acute respiratory failure with hypoxia: Secondary | ICD-10-CM | POA: Diagnosis present

## 2017-12-10 DIAGNOSIS — I10 Essential (primary) hypertension: Secondary | ICD-10-CM | POA: Diagnosis present

## 2017-12-10 DIAGNOSIS — Z833 Family history of diabetes mellitus: Secondary | ICD-10-CM | POA: Diagnosis not present

## 2017-12-10 DIAGNOSIS — E785 Hyperlipidemia, unspecified: Secondary | ICD-10-CM | POA: Diagnosis present

## 2017-12-10 DIAGNOSIS — F419 Anxiety disorder, unspecified: Secondary | ICD-10-CM | POA: Diagnosis present

## 2017-12-10 DIAGNOSIS — Z820 Family history of epilepsy and other diseases of the nervous system: Secondary | ICD-10-CM | POA: Diagnosis not present

## 2017-12-10 DIAGNOSIS — Z791 Long term (current) use of non-steroidal anti-inflammatories (NSAID): Secondary | ICD-10-CM | POA: Diagnosis not present

## 2017-12-10 DIAGNOSIS — F17210 Nicotine dependence, cigarettes, uncomplicated: Secondary | ICD-10-CM | POA: Diagnosis present

## 2017-12-10 DIAGNOSIS — E039 Hypothyroidism, unspecified: Secondary | ICD-10-CM | POA: Diagnosis present

## 2017-12-10 DIAGNOSIS — R05 Cough: Secondary | ICD-10-CM | POA: Diagnosis present

## 2017-12-10 DIAGNOSIS — Z23 Encounter for immunization: Secondary | ICD-10-CM | POA: Diagnosis not present

## 2017-12-10 DIAGNOSIS — Z7951 Long term (current) use of inhaled steroids: Secondary | ICD-10-CM | POA: Diagnosis not present

## 2017-12-10 DIAGNOSIS — J441 Chronic obstructive pulmonary disease with (acute) exacerbation: Secondary | ICD-10-CM | POA: Diagnosis present

## 2017-12-10 DIAGNOSIS — M722 Plantar fascial fibromatosis: Secondary | ICD-10-CM | POA: Diagnosis present

## 2017-12-10 DIAGNOSIS — M1990 Unspecified osteoarthritis, unspecified site: Secondary | ICD-10-CM | POA: Diagnosis present

## 2017-12-10 DIAGNOSIS — G43909 Migraine, unspecified, not intractable, without status migrainosus: Secondary | ICD-10-CM | POA: Diagnosis present

## 2017-12-10 LAB — CBC WITH DIFFERENTIAL/PLATELET
Band Neutrophils: 1 %
Basophils Absolute: 0 10*3/uL (ref 0–0.1)
Basophils Relative: 0 %
Blasts: 0 %
Eosinophils Absolute: 2.6 10*3/uL — ABNORMAL HIGH (ref 0–0.7)
Eosinophils Relative: 19 %
HCT: 36.1 % (ref 35.0–47.0)
Hemoglobin: 12.6 g/dL (ref 12.0–16.0)
Lymphocytes Relative: 25 %
Lymphs Abs: 3.4 10*3/uL (ref 1.0–3.6)
MCH: 29.1 pg (ref 26.0–34.0)
MCHC: 34.8 g/dL (ref 32.0–36.0)
MCV: 83.6 fL (ref 80.0–100.0)
Metamyelocytes Relative: 2 %
Monocytes Absolute: 0.4 10*3/uL (ref 0.2–0.9)
Monocytes Relative: 3 %
Myelocytes: 0 %
Neutro Abs: 7.1 10*3/uL — ABNORMAL HIGH (ref 1.4–6.5)
Neutrophils Relative %: 50 %
Other: 0 %
Platelets: 236 10*3/uL (ref 150–440)
Promyelocytes Relative: 0 %
RBC: 4.32 MIL/uL (ref 3.80–5.20)
RDW: 15.7 % — ABNORMAL HIGH (ref 11.5–14.5)
WBC: 13.5 10*3/uL — ABNORMAL HIGH (ref 3.6–11.0)
nRBC: 0 /100 WBC

## 2017-12-10 LAB — BLOOD GAS, VENOUS
Acid-Base Excess: 3.6 mmol/L — ABNORMAL HIGH (ref 0.0–2.0)
Bicarbonate: 29.6 mmol/L — ABNORMAL HIGH (ref 20.0–28.0)
O2 Saturation: 70.8 %
Patient temperature: 37
pCO2, Ven: 50 mmHg (ref 44.0–60.0)
pH, Ven: 7.38 (ref 7.250–7.430)
pO2, Ven: 38 mmHg (ref 32.0–45.0)

## 2017-12-10 LAB — HEMOGLOBIN A1C
Hgb A1c MFr Bld: 5.8 % — ABNORMAL HIGH (ref 4.8–5.6)
Mean Plasma Glucose: 119.76 mg/dL

## 2017-12-10 LAB — BASIC METABOLIC PANEL
Anion gap: 7 (ref 5–15)
BUN: 8 mg/dL (ref 8–23)
CO2: 29 mmol/L (ref 22–32)
Calcium: 9.1 mg/dL (ref 8.9–10.3)
Chloride: 102 mmol/L (ref 98–111)
Creatinine, Ser: 0.9 mg/dL (ref 0.44–1.00)
GFR calc Af Amer: >60 mL/min (ref >60)
GFR calc non Af Amer: >60 mL/min (ref >60)
Glucose, Bld: 98 mg/dL (ref 70–99)
Potassium: 4 mmol/L (ref 3.5–5.1)
Sodium: 138 mmol/L (ref 135–145)

## 2017-12-10 LAB — TROPONIN I: Troponin I: <0.03 ng/mL (ref <0.03)

## 2017-12-10 LAB — TSH: TSH: 3.764 u[IU]/mL (ref 0.350–4.500)

## 2017-12-10 LAB — BRAIN NATRIURETIC PEPTIDE: B Natriuretic Peptide: 57 pg/mL (ref 0.0–100.0)

## 2017-12-10 MED ORDER — CLONAZEPAM 0.5 MG PO TABS
0.5000 mg | ORAL_TABLET | Freq: Every day | ORAL | Status: DC
  Administered 2017-12-10 – 2017-12-12 (×3): 0.5 mg via ORAL
  Filled 2017-12-10 (×3): qty 1

## 2017-12-10 MED ORDER — ALBUTEROL SULFATE (2.5 MG/3ML) 0.083% IN NEBU
2.5000 mg | INHALATION_SOLUTION | Freq: Once | RESPIRATORY_TRACT | Status: AC
  Administered 2017-12-10: 2.5 mg via RESPIRATORY_TRACT
  Filled 2017-12-10: qty 3

## 2017-12-10 MED ORDER — LEVOTHYROXINE SODIUM 50 MCG PO TABS
150.0000 ug | ORAL_TABLET | Freq: Every day | ORAL | Status: DC
  Administered 2017-12-10 – 2017-12-12 (×3): 150 ug via ORAL
  Filled 2017-12-10 (×3): qty 1

## 2017-12-10 MED ORDER — GUAIFENESIN-DM 100-10 MG/5ML PO SYRP
5.0000 mL | ORAL_SOLUTION | ORAL | Status: DC | PRN
Start: 2017-12-10 — End: 2017-12-12
  Administered 2017-12-10 (×2): 5 mL via ORAL
  Filled 2017-12-10 (×2): qty 5

## 2017-12-10 MED ORDER — ALBUTEROL SULFATE (2.5 MG/3ML) 0.083% IN NEBU
2.5000 mg | INHALATION_SOLUTION | RESPIRATORY_TRACT | Status: DC | PRN
  Administered 2017-12-10: 2.5 mg via RESPIRATORY_TRACT
  Filled 2017-12-10: qty 3

## 2017-12-10 MED ORDER — HYDROCODONE-ACETAMINOPHEN 10-325 MG PO TABS
1.0000 | ORAL_TABLET | Freq: Three times a day (TID) | ORAL | Status: DC | PRN
  Administered 2017-12-10 – 2017-12-11 (×2): 1 via ORAL
  Filled 2017-12-10 (×2): qty 1

## 2017-12-10 MED ORDER — ENOXAPARIN SODIUM 40 MG/0.4ML ~~LOC~~ SOLN
40.0000 mg | SUBCUTANEOUS | Status: DC
  Administered 2017-12-10 – 2017-12-12 (×3): 40 mg via SUBCUTANEOUS
  Filled 2017-12-10 (×3): qty 0.4

## 2017-12-10 MED ORDER — QUETIAPINE FUMARATE 25 MG PO TABS
100.0000 mg | ORAL_TABLET | Freq: Every evening | ORAL | Status: DC | PRN
  Administered 2017-12-10 – 2017-12-11 (×2): 50 mg via ORAL
  Filled 2017-12-10 (×2): qty 4

## 2017-12-10 MED ORDER — ACETAMINOPHEN 650 MG RE SUPP: 650.0000 mg | Freq: Four times a day (QID) | RECTAL | Status: DC | PRN

## 2017-12-10 MED ORDER — ACETAMINOPHEN 325 MG PO TABS: 650.0000 mg | ORAL_TABLET | Freq: Four times a day (QID) | ORAL | Status: DC | PRN

## 2017-12-10 MED ORDER — LUBIPROSTONE 24 MCG PO CAPS
24.0000 ug | ORAL_CAPSULE | Freq: Two times a day (BID) | ORAL | Status: DC
  Administered 2017-12-10 – 2017-12-12 (×5): 24 ug via ORAL
  Filled 2017-12-10 (×6): qty 1

## 2017-12-10 MED ORDER — PNEUMOCOCCAL VAC POLYVALENT 25 MCG/0.5ML IJ INJ
0.5000 mL | INJECTION | INTRAMUSCULAR | Status: AC
  Administered 2017-12-11: 0.5 mL via INTRAMUSCULAR
  Filled 2017-12-10: qty 0.5

## 2017-12-10 MED ORDER — ALBUTEROL SULFATE (2.5 MG/3ML) 0.083% IN NEBU: 2.5000 mg | INHALATION_SOLUTION | RESPIRATORY_TRACT | Status: DC | PRN

## 2017-12-10 MED ORDER — VITAMIN D3 25 MCG (1000 UNIT) PO TABS: 5000.0000 [IU] | ORAL_TABLET | ORAL | Status: DC

## 2017-12-10 MED ORDER — ONDANSETRON HCL 4 MG/2ML IJ SOLN: 4.0000 mg | Freq: Four times a day (QID) | INTRAMUSCULAR | Status: DC | PRN

## 2017-12-10 MED ORDER — TIOTROPIUM BROMIDE MONOHYDRATE 18 MCG IN CAPS
18.0000 ug | ORAL_CAPSULE | Freq: Every day | RESPIRATORY_TRACT | Status: DC
  Administered 2017-12-10: 18 ug via RESPIRATORY_TRACT
  Filled 2017-12-10: qty 5

## 2017-12-10 MED ORDER — LEVOFLOXACIN 500 MG PO TABS
500.0000 mg | ORAL_TABLET | Freq: Every day | ORAL | Status: AC
  Administered 2017-12-10: 500 mg via ORAL
  Filled 2017-12-10: qty 1

## 2017-12-10 MED ORDER — MONTELUKAST SODIUM 10 MG PO TABS
10.0000 mg | ORAL_TABLET | Freq: Every day | ORAL | Status: DC
  Administered 2017-12-10 – 2017-12-11 (×2): 10 mg via ORAL
  Filled 2017-12-10 (×2): qty 1

## 2017-12-10 MED ORDER — IPRATROPIUM-ALBUTEROL 0.5-2.5 (3) MG/3ML IN SOLN
3.0000 mL | Freq: Four times a day (QID) | RESPIRATORY_TRACT | Status: DC
  Administered 2017-12-10 – 2017-12-12 (×9): 3 mL via RESPIRATORY_TRACT
  Filled 2017-12-10 (×9): qty 3

## 2017-12-10 MED ORDER — FLUTICASONE FUROATE-VILANTEROL 200-25 MCG/INH IN AEPB
1.0000 | INHALATION_SPRAY | Freq: Every day | RESPIRATORY_TRACT | Status: DC
  Administered 2017-12-10: 1 via RESPIRATORY_TRACT
  Filled 2017-12-10: qty 28

## 2017-12-10 MED ORDER — METHYLPREDNISOLONE SODIUM SUCC 125 MG IJ SOLR
60.0000 mg | Freq: Four times a day (QID) | INTRAMUSCULAR | Status: DC
  Administered 2017-12-10 – 2017-12-11 (×4): 60 mg via INTRAVENOUS
  Filled 2017-12-10 (×4): qty 2

## 2017-12-10 MED ORDER — GABAPENTIN 400 MG PO CAPS
400.0000 mg | ORAL_CAPSULE | Freq: Four times a day (QID) | ORAL | Status: DC
  Administered 2017-12-10 – 2017-12-12 (×8): 400 mg via ORAL
  Filled 2017-12-10 (×8): qty 1

## 2017-12-10 MED ORDER — ATORVASTATIN CALCIUM 20 MG PO TABS
40.0000 mg | ORAL_TABLET | Freq: Every day | ORAL | Status: DC
  Administered 2017-12-10 – 2017-12-11 (×2): 40 mg via ORAL
  Filled 2017-12-10 (×2): qty 2

## 2017-12-10 MED ORDER — HYDROCOD POLST-CPM POLST ER 10-8 MG/5ML PO SUER
5.0000 mL | Freq: Two times a day (BID) | ORAL | Status: DC
  Administered 2017-12-10 – 2017-12-12 (×5): 5 mL via ORAL
  Filled 2017-12-10 (×5): qty 5

## 2017-12-10 MED ORDER — BUTALBITAL-APAP-CAFFEINE 50-325-40 MG PO TABS: 1.0000 | ORAL_TABLET | Freq: Four times a day (QID) | ORAL | Status: DC | PRN

## 2017-12-10 MED ORDER — ORAL CARE MOUTH RINSE
15.0000 mL | Freq: Two times a day (BID) | OROMUCOSAL | Status: DC
  Administered 2017-12-10 – 2017-12-11 (×2): 15 mL via OROMUCOSAL

## 2017-12-10 MED ORDER — ONDANSETRON HCL 4 MG PO TABS: 4.0000 mg | ORAL_TABLET | Freq: Four times a day (QID) | ORAL | Status: DC | PRN

## 2017-12-10 MED ORDER — AMLODIPINE BESYLATE 10 MG PO TABS
10.0000 mg | ORAL_TABLET | Freq: Two times a day (BID) | ORAL | Status: DC
  Administered 2017-12-10 – 2017-12-12 (×5): 10 mg via ORAL
  Filled 2017-12-10 (×5): qty 1

## 2017-12-10 NOTE — Progress Notes (Signed)
Pt is having difficulty breathing, lungs show coarse and exp wheeze to ascultation, Pt is currently on 2L via [place]. MD notified for patients home neb orders. I will continue to assess.

## 2017-12-10 NOTE — ED Notes (Signed)
Pt placed on 2L of O2 via nasal cannula.

## 2017-12-10 NOTE — Plan of Care (Signed)
  Problem: Activity: Goal: Risk for activity intolerance will decrease Outcome: Progressing   Problem: Pain Managment: Goal: General experience of comfort will improve Outcome: Progressing   Problem: Safety: Goal: Ability to remain free from injury will improve Outcome: Progressing   Problem: Education: Goal: Knowledge of General Education information will improve Description Including pain rating scale, medication(s)/side effects and non-pharmacologic comfort measures Outcome: Completed/Met

## 2017-12-10 NOTE — H&P (Signed)
Audrey Campbell is an 62 y.o. female.   Chief Complaint: Shortness of breath HPI: The patient with past medical history of hyper pressure, hypothyroidism and newly diagnosed COPD presents to the emergency department with shortness of breath.  The patient has had nearly a week of cough.  She was prescribed antibiotics by her primary physician but she is continued to have shortness of breath and productive cough.  Despite multiple breathing treatments the patient's oxygen saturations were 79%at one point in the emergency department.  She received IV Solu-Medrol prior to the emergency department staff calling the hospitalist service for admission.  Past Medical History:  Diagnosis Date  . Anxiety   . Arthritis   . COPD (chronic obstructive pulmonary disease) ([place])   . HBP (high blood pressure)   . High blood pressure   . Hypothyroidism   . Plantar fasciitis     Past Surgical History:  Procedure Laterality Date  . DILATION AND CURETTAGE OF UTERUS    . FOOT SURGERY Bilateral   . RHINOPLASTY    . TUBAL LIGATION      Family History  Problem Relation Age of Onset  . Diabetes Father   . Seizures Sister   . Breast cancer Neg Hx    Social History:  reports that she has been smoking cigarettes. She has been smoking about 0.50 packs per day. She has never used smokeless tobacco. She reports that she does not drink alcohol or use drugs.  Allergies:  Allergies  Allergen Reactions  . Penicillins Rash and Other (See Comments)    Has patient had a PCN reaction causing immediate rash, facial/tongue/throat swelling, SOB or lightheadedness with hypotension: No Has patient had a PCN reaction causing severe rash involving mucus membranes or skin necrosis: No Has patient had a PCN reaction that required hospitalization: No Has patient had a PCN reaction occurring within the last 10 years: Yes If all of the above answers are "NO", then may proceed with Cephalosporin use.     Medications Prior to  Admission  Medication Sig Dispense Refill  . albuterol (PROVENTIL HFA) 108 (90 Base) MCG/ACT inhaler Inhale 2 puffs into the lungs every 6 (six) hours as needed for wheezing or shortness of breath.     . AMITIZA 24 MCG capsule Take 24 mcg by mouth 2 (two) times daily with a meal.   4  . amLODipine (NORVASC) 10 MG tablet Take 10 mg by mouth 2 (two) times daily.   4  . atorvastatin (LIPITOR) 40 MG tablet Take 40 mg by mouth daily.  4  . budesonide-formoterol (SYMBICORT) 160-4.5 MCG/ACT inhaler Inhale 2 puffs into the lungs 2 (two) times daily.     . butalbital-acetaminophen-caffeine (FIORICET, ESGIC) 50-325-40 MG tablet Take 1 tablet by mouth every 6 (six) hours as needed for headache.   4  . Cholecalciferol (VITAMIN D-3) 5000 UNITS TABS Take 5,000 Units by mouth once a week. Takes on Monday    . clonazePAM (KLONOPIN) 0.5 MG tablet Take 0.5 mg by mouth daily.     Marland Kitchen gabapentin (NEURONTIN) 400 MG capsule Take 400 mg by mouth 4 (four) times daily.     Marland Kitchen HYDROcodone-acetaminophen (NORCO) 10-325 MG per tablet Take 1 tablet by mouth every 8 (eight) hours as needed for pain. (Patient taking differently: Take 1 tablet by mouth every 6 (six) hours as needed. ) 30 tablet 0  . levofloxacin (LEVAQUIN) 500 MG tablet Take 500 mg by mouth daily.    Marland Kitchen levothyroxine (SYNTHROID, LEVOTHROID) 150  MCG tablet Take 150 mcg by mouth daily before breakfast.   4  . meloxicam (MOBIC) 7.5 MG tablet Take 7.5 mg by mouth 2 (two) times daily.    . montelukast (SINGULAIR) 10 MG tablet Take 10 mg by mouth at bedtime.   4  . QUEtiapine (SEROQUEL) 100 MG tablet Take 100 mg by mouth at bedtime as needed (sleep).   4  . SPIRIVA HANDIHALER 18 MCG inhalation capsule Place 18 mcg into inhaler and inhale daily.   4    Results for orders placed or performed during the hospital encounter of 12/09/17 (from the past 48 hour(s))  CBC with Differential     Status: Abnormal   Collection Time: 12/09/17 11:59 PM  Result Value Ref Range   WBC  13.5 (H) 3.6 - 11.0 K/uL   RBC 4.32 3.80 - 5.20 MIL/uL   Hemoglobin 12.6 12.0 - 16.0 g/dL   HCT 36.1 35.0 - 47.0 %   MCV 83.6 80.0 - 100.0 fL   MCH 29.1 26.0 - 34.0 pg   MCHC 34.8 32.0 - 36.0 g/dL   RDW 15.7 (H) 11.5 - 14.5 %   Platelets 236 150 - 440 K/uL   Neutrophils Relative % 50 %   Lymphocytes Relative 25 %   Monocytes Relative 3 %   Eosinophils Relative 19 %   Basophils Relative 0 %   Band Neutrophils 1 %   Metamyelocytes Relative 2 %   Myelocytes 0 %   Promyelocytes Relative 0 %   Blasts 0 %   nRBC 0 0 /100 WBC   Other 0 %   Neutro Abs 7.1 (H) 1.4 - 6.5 K/uL   Lymphs Abs 3.4 1.0 - 3.6 K/uL   Monocytes Absolute 0.4 0.2 - 0.9 K/uL   Eosinophils Absolute 2.6 (H) 0 - 0.7 K/uL   Basophils Absolute 0.0 0 - 0.1 K/uL   RBC Morphology MIXED RBC POPULATION     Comment: Performed at [facility], [address] [place], [place] [address]  Basic metabolic panel     Status: None   Collection Time: 12/09/17 11:59 PM  Result Value Ref Range   Sodium 138 135 - 145 mmol/L   Potassium 4.0 3.5 - 5.1 mmol/L   Chloride 102 98 - 111 mmol/L   CO2 29 22 - 32 mmol/L   Glucose, Bld 98 70 - 99 mg/dL   BUN 8 8 - 23 mg/dL   Creatinine, Ser 0.90 0.44 - 1.00 mg/dL   Calcium 9.1 8.9 - 10.3 mg/dL   GFR calc non Af Amer >60 >60 mL/min   GFR calc Af Amer >60 >60 mL/min    Comment: (NOTE) The eGFR has been calculated using the CKD EPI equation. This calculation has not been validated in all clinical situations. eGFR's persistently <60 mL/min signify possible Chronic Kidney Disease.    Anion gap 7 5 - 15    Comment: Performed at [facility], [address] [place], [place] [address]  Brain natriuretic peptide     Status: None   Collection Time: 12/09/17 11:59 PM  Result Value Ref Range   B Natriuretic Peptide 57.0 0.0 - 100.0 pg/mL    Comment: Performed at [facility], [address] [place], [place] [address]  Troponin I     Status: None   Collection  Time: 12/09/17 11:59 PM  Result Value Ref Range   Troponin I <0.03 <0.03 ng/mL    Comment: Performed at [facility], [address] [place], [place] [address]  Blood gas, venous     Status: Abnormal   Collection Time: 12/10/17  1:32 AM  Result Value Ref Range   pH, Ven 7.38 7.250 - 7.430   pCO2, Ven 50 44.0 - 60.0 mmHg   pO2, Ven 38.0 32.0 - 45.0 mmHg   Bicarbonate 29.6 (H) 20.0 - 28.0 mmol/L   Acid-Base Excess 3.6 (H) 0.0 - 2.0 mmol/L   O2 Saturation 70.8 %   Patient temperature 37.0    Collection site VENOUS    Sample type VENOUS     Comment: Performed at [facility], [address] [place], [place] [address]  TSH     Status: None   Collection Time: 12/10/17  4:49 AM  Result Value Ref Range   TSH 3.764 0.350 - 4.500 uIU/mL    Comment: Performed by a 3rd Generation assay with a functional sensitivity of <=0.01 uIU/mL. Performed at [facility], [address] [place], [place] [address]    Dg Chest Port 1 View  Result Date: 12/10/2017 CLINICAL DATA:  Dyspnea EXAM: PORTABLE CHEST 1 VIEW COMPARISON:  07/27/2015 FINDINGS: Cardiac shadow is mildly enlarged but accentuated by the portable technique. The lungs are clear bilaterally. No acute bony abnormality is seen. IMPRESSION: No active disease. Electronically Signed   By: Inez Catalina M.D.   On: 12/10/2017 00:19    Review of Systems  Constitutional: Negative for chills and fever.  HENT: Negative for sore throat and tinnitus.   Eyes: Negative for blurred vision and redness.  Respiratory: Positive for cough, sputum production, shortness of breath and wheezing.   Cardiovascular: Negative for chest pain, palpitations, orthopnea and PND.  Gastrointestinal: Negative for abdominal pain, diarrhea, nausea and vomiting.  Genitourinary: Negative for dysuria, frequency and urgency.  Musculoskeletal: Negative for joint pain and myalgias.  Skin: Negative for rash.       No lesions  Neurological: Negative for  speech change, focal weakness and weakness.  Endo/Heme/Allergies: Does not bruise/bleed easily.       No temperature intolerance  Psychiatric/Behavioral: Negative for depression and suicidal ideas.    Blood pressure (!) 171/93, pulse 81, temperature 98.2 F (36.8 C), temperature source Oral, resp. rate 18, height '5\' 5"'  (1.651 m), weight 92.5 kg, SpO2 96 %. Physical Exam  Vitals reviewed. Constitutional: She is oriented to person, place, and time. She appears well-developed and well-nourished. No distress.  HENT:  Head: Normocephalic and atraumatic.  Mouth/Throat: Oropharynx is clear and moist.  Eyes: Pupils are equal, round, and reactive to light. Conjunctivae and EOM are normal. No scleral icterus.  Neck: Normal range of motion. Neck supple. No JVD present. No tracheal deviation present. No thyromegaly present.  Cardiovascular: Normal rate, regular rhythm and normal heart sounds. Exam reveals no gallop and no friction rub.  No murmur heard. Respiratory: Effort normal. She has wheezes.  GI: Soft. Bowel sounds are normal. She exhibits no distension. There is no tenderness.  Genitourinary:  Genitourinary Comments: Deferred  Musculoskeletal: Normal range of motion. She exhibits no edema.  Lymphadenopathy:    She has no cervical adenopathy.  Neurological: She is alert and oriented to person, place, and time. No cranial nerve deficit. She exhibits normal muscle tone.  Skin: Skin is warm and dry. No rash noted. No erythema.  Psychiatric: She has a normal mood and affect. Her behavior is normal. Judgment and thought content normal.     Assessment/Plan This is a 62 year old female admitted for respiratory failure with hypoxemia.  1.  Acute respiratory failure: With hypoxemia.  Secondary to COPD exacerbation.  The patient has nearly completed a 5-day course of Levaquin.  Steroid taper.  Continue supplemental oxygen as needed.  The patient is currently on room air. 2.  COPD exacerbation:  Continue inhaled corticosteroid, LABA and anticholinergic agent.  Albuterol as needed. 3.  Hypertension: Controlled; continue amlodipine.  Labetalol as needed. 4.  Hypothyroidism: Check TSH; continue Synthroid 5.  Hyperlipidemia: Continue statin therapy 6.  DVT prophylaxis: Lovenox 7.  GI prophylaxis: None The patient is a full code.  Time spent on admission orders and patient care approximately 45 minutes  Harrie Foreman, MD 12/10/2017, 6:46 AM

## 2017-12-10 NOTE — ED Notes (Signed)
Pt resting quietly.

## 2017-12-10 NOTE — Progress Notes (Signed)
[facility] [place] NAME: Audrey Campbell    MR#:  [id number]  DATE OF BIRTH:  02/13/1956  SUBJECTIVE:  Patient seen and evaluated today Has cough and shortness of breath Has wheezing  on oxygen via nasal cannula  REVIEW OF SYSTEMS:    ROS  CONSTITUTIONAL: No documented fever. No fatigue, weakness. No weight gain, no weight loss.  EYES: No blurry or double vision.  ENT: No tinnitus. No postnasal drip. No redness of the oropharynx.  RESPIRATORY: Has cough, Has wheezing, no hemoptysis. Has dyspnea.  CARDIOVASCULAR: No chest pain. No orthopnea. No palpitations. No syncope.  GASTROINTESTINAL: No nausea, no vomiting or diarrhea. No abdominal pain. No melena or hematochezia.  GENITOURINARY: No dysuria or hematuria.  ENDOCRINE: No polyuria or nocturia. No heat or cold intolerance.  HEMATOLOGY: No anemia. No bruising. No bleeding.  INTEGUMENTARY: No rashes. No lesions.  MUSCULOSKELETAL: No arthritis. No swelling. No gout.  NEUROLOGIC: No numbness, tingling, or ataxia. No seizure-type activity.  PSYCHIATRIC: No anxiety. No insomnia. No ADD.   DRUG ALLERGIES:   Allergies  Allergen Reactions  . Penicillins Rash and Other (See Comments)    Has patient had a PCN reaction causing immediate rash, facial/tongue/throat swelling, SOB or lightheadedness with hypotension: No Has patient had a PCN reaction causing severe rash involving mucus membranes or skin necrosis: No Has patient had a PCN reaction that required hospitalization: No Has patient had a PCN reaction occurring within the last 10 years: Yes If all of the above answers are "NO", then may proceed with Cephalosporin use.     VITALS:  Blood pressure (!) 170/87, pulse 75, temperature 98.1 F (36.7 C), temperature source Oral, resp. rate 18, height 5\' 5"  (1.651 m), weight 92.5 kg, SpO2 95 %.  PHYSICAL EXAMINATION:   Physical Exam  GENERAL:  62 y.o.-year-old patient lying in the bed  with no acute distress.  EYES: Pupils equal, round, reactive to light and accommodation. No scleral icterus. Extraocular muscles intact.  HEENT: Head atraumatic, normocephalic. Oropharynx and nasopharynx clear.  NECK:  Supple, no jugular venous distention. No thyroid enlargement, no tenderness.  LUNGS: Bilateral breath sounds bilateral wheezing. No use of accessory muscles of respiration.  CARDIOVASCULAR: S1, S2 normal. No murmurs, rubs, or gallops.  ABDOMEN: Soft, nontender, nondistended. Bowel sounds present. No organomegaly or mass.  EXTREMITIES: No cyanosis, clubbing or edema b/l.    NEUROLOGIC: Cranial nerves II through XII are intact. No focal Motor or sensory deficits b/l.   PSYCHIATRIC: The patient is alert and oriented x 3.  SKIN: No obvious rash, lesion, or ulcer.   LABORATORY PANEL:   CBC Recent Labs  Lab 12/09/17 2359  WBC 13.5*  HGB 12.6  HCT 36.1  PLT 236   ------------------------------------------------------------------------------------------------------------------ Chemistries  Recent Labs  Lab 12/09/17 2359  NA 138  K 4.0  CL 102  CO2 29  GLUCOSE 98  BUN 8  CREATININE 0.90  CALCIUM 9.1   ------------------------------------------------------------------------------------------------------------------  Cardiac Enzymes Recent Labs  Lab 12/09/17 2359  TROPONINI <0.03   ------------------------------------------------------------------------------------------------------------------  RADIOLOGY:  Dg Chest Port 1 View  Result Date: 12/10/2017 CLINICAL DATA:  Dyspnea EXAM: PORTABLE CHEST 1 VIEW COMPARISON:  07/27/2015 FINDINGS: Cardiac shadow is mildly enlarged but accentuated by the portable technique. The lungs are clear bilaterally. No acute bony abnormality is seen. IMPRESSION: No active disease. Electronically Signed   By: Inez Catalina M.D.   On: 12/10/2017 00:19     ASSESSMENT AND PLAN:  62 year old female patient with history of COPD,  hypertension, hypothyroidism currently under hospitalist service for shortness of breath and cough  -Acute respiratory distress with hypoxia Oxygen via nasal cannula Nebulization treatments  -Acute COPD exacerbation Recently completed course of Levaquin antibiotic IV Solu-Medrol 60 mg 6 hourly Aggressive nebulization treatments  -Chronic bronchitis Mucolytics  -DVT prophylaxis subcu Lovenox daily  -Tobacco abuse Tobacco cessation counseled for six minutes Uses chantix  All the records are reviewed and case discussed with Care Management/Social Worker. Management plans discussed with the patient, family and they are in agreement.  CODE STATUS: Full code  DVT Prophylaxis: SCDs  TOTAL TIME TAKING CARE OF THIS PATIENT: 35 minutes.   POSSIBLE D/C IN 1 to 2 DAYS, DEPENDING ON CLINICAL CONDITION.  Saundra Shelling M.D on 12/10/2017 at 1:00 PM  Between 7am to 6pm - Pager - [phone]  After 6pm go to [email or web address] - password [email or web address] [facility]  Office  [phone]  CC: Primary care physician; Lorelee Market, MD  Note: This dictation was prepared with Dragon dictation along with smaller phrase technology. Any transcriptional errors that result from this process are unintentional.

## 2017-12-10 NOTE — Progress Notes (Signed)
Advanced care plan.  Purpose of the Encounter: CODE STATUS  Parties in Attendance: Patient  Patient's Decision Capacity: Good  Subjective/Patient's story: Presented to the emergency room for shortness of breath and wheezing and cough   Objective/Medical story Has acute COPD exacerbation and bronchitis   Goals of care determination:  Advance care directives and goals of cared discussed Patient wants everything done which includes cpr, intubation if need arises.   CODE STATUS: Full code   Time spent discussing advanced care planning: 16 minutes

## 2017-12-10 NOTE — ED Provider Notes (Addendum)
[facility] Emergency Department Provider Note    First MD Initiated Contact with Patient 12/10/17 0002     (approximate)  I have reviewed the triage vital signs and the nursing notes.   HISTORY  Chief Complaint No chief complaint on file.    HPI Audrey Campbell is a 62 y.o. female Audrey Campbell to the emergency department via EMS with a 4-day history of dyspnea productive cough and congestion.  Patient was seen by her primary care provider Dr. Brunetta Campbell who prescribed the patient Levaquin 4 days ago.  Patient states that despite taking Levaquin she continues to have difficulty breathing which progressively worsened tonight.  On EMS arrival the patient with considerable work of breathing with diffuse wheezing per EMS staff.  Patient was given 2 DuoNeb's in the 125 mg of Solu-Medrol in route with improvement of symptoms.  On arrival to the emergency department patient's oxygen saturation 92% on room air.   Past Medical History:  Diagnosis Date  . Anxiety   . Arthritis   . COPD (chronic obstructive pulmonary disease) ([place])   . HBP (high blood pressure)   . High blood pressure   . Hypothyroidism   . Plantar fasciitis     Patient Active Problem List   Diagnosis Date Noted  . Acute respiratory failure with hypoxemia ([place]) 12/10/2017  . Obesity 07/16/2017  . Hypothyroidism 07/16/2017  . Smoker 07/16/2017  . Plantar fasciitis, bilateral 07/16/2017    Past Surgical History:  Procedure Laterality Date  . DILATION AND CURETTAGE OF UTERUS    . FOOT SURGERY Bilateral   . RHINOPLASTY    . TUBAL LIGATION      Prior to Admission medications   Medication Sig Start Date End Date Taking? Authorizing Provider  albuterol (PROVENTIL HFA) 108 (90 Base) MCG/ACT inhaler Inhale 2 puffs into the lungs every 6 (six) hours as needed for wheezing or shortness of breath.    Yes [provider]  AMITIZA 24 MCG capsule Take 24 mcg by mouth 2 (two) times daily with a  meal.  07/15/17  Yes [provider]  amLODipine (NORVASC) 10 MG tablet Take 10 mg by mouth 2 (two) times daily.  07/15/17  Yes [provider]  atorvastatin (LIPITOR) 40 MG tablet Take 40 mg by mouth daily. 10/14/17  Yes [provider]  budesonide-formoterol (SYMBICORT) 160-4.5 MCG/ACT inhaler Inhale 2 puffs into the lungs 2 (two) times daily.    Yes [provider]  butalbital-acetaminophen-caffeine (FIORICET, ESGIC) 50-325-40 MG tablet Take 1 tablet by mouth every 6 (six) hours as needed for headache.  10/14/17  Yes [provider]  Cholecalciferol (VITAMIN D-3) 5000 UNITS TABS Take 5,000 Units by mouth once a week. Takes on Monday   Yes [provider]  clonazePAM (KLONOPIN) 0.5 MG tablet Take 0.5 mg by mouth daily.    Yes [provider]  gabapentin (NEURONTIN) 400 MG capsule Take 400 mg by mouth 4 (four) times daily.    Yes [provider]  HYDROcodone-acetaminophen (NORCO) 10-325 MG per tablet Take 1 tablet by mouth every 8 (eight) hours as needed for pain. Patient taking differently: Take 1 tablet by mouth every 6 (six) hours as needed.  02/26/13  Yes Regal, Tamala Fothergill, DPM  levofloxacin (LEVAQUIN) 500 MG tablet Take 500 mg by mouth daily.   Yes [provider]  levothyroxine (SYNTHROID, LEVOTHROID) 150 MCG tablet Take 150 mcg by mouth daily before breakfast.  05/19/17  Yes [provider]  meloxicam (  MOBIC) 7.5 MG tablet Take 7.5 mg by mouth 2 (two) times daily.   Yes [provider]  montelukast (SINGULAIR) 10 MG tablet Take 10 mg by mouth at bedtime.  07/15/17  Yes [provider]  QUEtiapine (SEROQUEL) 100 MG tablet Take 100 mg by mouth at bedtime as needed (sleep).  07/15/17  Yes [provider]  SPIRIVA HANDIHALER 18 MCG inhalation capsule Place 18 mcg into inhaler and inhale daily.  07/15/17  Yes [provider]    Allergies Penicillins  Family History  Problem  Relation Age of Onset  . Diabetes Father   . Seizures Sister   . Breast cancer Neg Hx     Social History Social History   Tobacco Use  . Smoking status: Current Every Day Smoker    Packs/day: 0.50    Types: Cigarettes  . Smokeless tobacco: Never Used  Substance Use Topics  . Alcohol use: No  . Drug use: No    Review of Systems Constitutional: No fever/chills Eyes: No visual changes. ENT: No sore throat. Cardiovascular: Denies chest pain. Respiratory: Positive for cough and dyspnea Gastrointestinal: No abdominal pain.  No nausea, no vomiting.  No diarrhea.  No constipation. Genitourinary: Negative for dysuria. Musculoskeletal: Negative for neck pain.  Negative for back pain. Integumentary: Negative for rash. Neurological: Negative for headaches, focal weakness or numbness.   ____________________________________________   PHYSICAL EXAM:  VITAL SIGNS: ED Triage Vitals  Enc Vitals Group     BP 12/10/17 0001 (!) 167/89     Pulse --      Resp 12/10/17 0001 20     Temp 12/10/17 0001 98.2 F (36.8 C)     Temp Source 12/10/17 0001 Oral     SpO2 12/10/17 0001 92 %     Weight --      Height --      Head Circumference --      Peak Flow --      Pain Score 12/10/17 0002 0     Pain Loc --      Pain Edu? --      Excl. in [place]? --     Constitutional: Alert and oriented. Well appearing and in no acute distress. Eyes: Conjunctivae are normal.  Head: Atraumatic. Mouth/Throat: Mucous membranes are moist. Oropharynx non-erythematous. Neck: No stridor.  No meningeal signs.   Cardiovascular: Normal rate, regular rhythm. Good peripheral circulation. Grossly normal heart sounds. Respiratory: Positive accessory respiratory muscle use, tachypnea diffuse expiratory wheezing and rhonchi Gastrointestinal: Soft and nontender. No distention.  Musculoskeletal: No lower extremity tenderness nor edema. No gross deformities of extremities. Neurologic:  Normal speech and language. No gross  focal neurologic deficits are appreciated.  Skin:  Skin is warm, dry and intact. No rash noted. Psychiatric: Mood and affect are normal. Speech and behavior are normal.  ____________________________________________   LABS (all labs ordered are listed, but only abnormal results are displayed)  Labs Reviewed  CBC WITH DIFFERENTIAL/PLATELET - Abnormal; Notable for the following components:      Result Value   WBC 13.5 (*)    RDW 15.7 (*)    Neutro Abs 7.1 (*)    Eosinophils Absolute 2.6 (*)    All other components within normal limits  BLOOD GAS, VENOUS - Abnormal; Notable for the following components:   Bicarbonate 29.6 (*)    Acid-Base Excess 3.6 (*)    All other components within normal limits  BASIC METABOLIC PANEL  BRAIN NATRIURETIC PEPTIDE  TROPONIN I  TSH  HEMOGLOBIN A1C   _______  RADIOLOGY I, [place] Toben Acuna, personally viewed and evaluated these images (plain radiographs) as part of my medical decision making, as well as reviewing the written report by the radiologist.  ED MD interpretation: No active disease on chest x-ray per radiologist.  Official radiology report(s): Dg Chest Port 1 View  Result Date: 12/10/2017 CLINICAL DATA:  Dyspnea EXAM: PORTABLE CHEST 1 VIEW COMPARISON:  07/27/2015 FINDINGS: Cardiac shadow is mildly enlarged but accentuated by the portable technique. The lungs are clear bilaterally. No acute bony abnormality is seen. IMPRESSION: No active disease. Electronically Signed   By: Inez Catalina M.D.   On: 12/10/2017 00:19   ED ECG REPORT I,  N Axiel Fjeld, the attending physician, personally viewed and interpreted this ECG.   Date: 12/10/2017  EKG Time: 12:04 AM  Rate: 70  Rhythm: Sinus rhythm  Axis: Normal  Intervals: Normal  ST&T Change: None   Procedures   ____________________________________________   INITIAL IMPRESSION / ASSESSMENT AND PLAN / ED COURSE  As part of my medical decision making, I reviewed the following data  within the electronic MEDICAL RECORD NUMBER   62 year old female presented with above-stated history and physical exam secondary to dyspnea with hypoxia.  Patient given 2 additional albuterol treatments in the emergency department.  On reevaluation patient states that she is feeling better however oxygen saturation 86% on room air.  2 L nasal cannula applied with resultant oxygen saturation 91 to 92%.  Patient discussed with Dr. Jannifer Franklin for hospital admission for further evaluation and management of acute on chronic COPD exacerbation.     ____________________________________________  FINAL CLINICAL IMPRESSION(S) / ED DIAGNOSES  Final diagnoses:  COPD with acute exacerbation ([place])     MEDICATIONS GIVEN DURING THIS VISIT:  Medications  gabapentin (NEURONTIN) capsule 400 mg (has no administration in time range)  cholecalciferol (VITAMIN D) tablet 5,000 Units (5,000 Units Oral Not Given 12/10/17 0338)  clonazePAM (KLONOPIN) tablet 0.5 mg (has no administration in time range)  HYDROcodone-acetaminophen (NORCO) 10-325 MG per tablet 1 tablet (has no administration in time range)  amLODipine (NORVASC) tablet 10 mg (10 mg Oral Not Given 12/10/17 0352)  fluticasone furoate-vilanterol (BREO ELLIPTA) 200-25 MCG/INH 1 puff (has no administration in time range)  levothyroxine (SYNTHROID, LEVOTHROID) tablet 150 mcg (has no administration in time range)  lubiprostone (AMITIZA) capsule 24 mcg (has no administration in time range)  montelukast (SINGULAIR) tablet 10 mg (has no administration in time range)  QUEtiapine (SEROQUEL) tablet 100 mg (has no administration in time range)  tiotropium (SPIRIVA) inhalation capsule 18 mcg (has no administration in time range)  butalbital-acetaminophen-caffeine (FIORICET, ESGIC) 50-325-40 MG per tablet 1 tablet (has no administration in time range)  atorvastatin (LIPITOR) tablet 40 mg (has no administration in time range)  levofloxacin (LEVAQUIN) tablet 500 mg (has no  administration in time range)  enoxaparin (LOVENOX) injection 40 mg (has no administration in time range)  acetaminophen (TYLENOL) tablet 650 mg (has no administration in time range)    Or  acetaminophen (TYLENOL) suppository 650 mg (has no administration in time range)  ondansetron (ZOFRAN) tablet 4 mg (has no administration in time range)    Or  ondansetron (ZOFRAN) injection 4 mg (has no administration in time range)  pneumococcal 23 valent vaccine (PNU-IMMUNE) injection 0.5 mL (has no administration in time range)  MEDLINE mouth rinse (has no administration in time range)  albuterol (PROVENTIL) (2.5 MG/3ML) 0.083% nebulizer solution 2.5 mg (2.5 mg Nebulization Given 12/10/17 0023)  albuterol (PROVENTIL) (  2.5 MG/3ML) 0.083% nebulizer solution 2.5 mg (2.5 mg Nebulization Given 12/10/17 0023)     ED Discharge Orders    None       Note:  This document was prepared using Dragon voice recognition software and may include unintentional dictation errors.    Gregor Hams, MD 12/10/17 4975    Gregor Hams, MD 12/18/17 2232

## 2017-12-10 NOTE — Progress Notes (Signed)
Pt requesting something for productive cough, MD paged, Dr. Marcille Blanco to put in robitussin. Will give and continue to monitor. Conley Simmonds, RN, BSN

## 2017-12-11 MED ORDER — BUDESONIDE 0.5 MG/2ML IN SUSP
0.5000 mg | Freq: Two times a day (BID) | RESPIRATORY_TRACT | Status: DC
  Administered 2017-12-11 – 2017-12-12 (×2): 0.5 mg via RESPIRATORY_TRACT
  Filled 2017-12-11 (×2): qty 2

## 2017-12-11 MED ORDER — METHYLPREDNISOLONE SODIUM SUCC 125 MG IJ SOLR
60.0000 mg | Freq: Two times a day (BID) | INTRAMUSCULAR | Status: DC
  Administered 2017-12-11 – 2017-12-12 (×2): 60 mg via INTRAVENOUS
  Filled 2017-12-11 (×2): qty 2

## 2017-12-11 NOTE — Progress Notes (Signed)
[facility] NAME: Audrey Campbell    MR#:  [id number]  DATE OF BIRTH:  11-Apr-1956  SUBJECTIVE:   Patient here due to shortness of breath and noted to be in acute COPD exacerbation.  Still complaining of some mild chest tightness and a cough but overall feels better.  REVIEW OF SYSTEMS:    Review of Systems  Constitutional: Negative for chills and fever.  HENT: Negative for congestion and tinnitus.   Eyes: Negative for blurred vision and double vision.  Respiratory: Positive for cough, shortness of breath and wheezing.   Cardiovascular: Negative for chest pain, orthopnea and PND.  Gastrointestinal: Negative for abdominal pain, diarrhea, nausea and vomiting.  Genitourinary: Negative for dysuria and hematuria.  Neurological: Negative for dizziness, sensory change and focal weakness.  All other systems reviewed and are negative.   Nutrition: Heart Healthy Tolerating Diet: Yes Tolerating PT: Ambulatory   DRUG ALLERGIES:   Allergies  Allergen Reactions  . Penicillins Rash and Other (See Comments)    Has patient had a PCN reaction causing immediate rash, facial/tongue/throat swelling, SOB or lightheadedness with hypotension: No Has patient had a PCN reaction causing severe rash involving mucus membranes or skin necrosis: No Has patient had a PCN reaction that required hospitalization: No Has patient had a PCN reaction occurring within the last 10 years: Yes If all of the above answers are "NO", then may proceed with Cephalosporin use.     VITALS:  Blood pressure 126/71, pulse 76, temperature 97.7 F (36.5 C), temperature source Oral, resp. rate 14, height 5\' 5"  (1.651 m), weight 92.4 kg, SpO2 94 %.  PHYSICAL EXAMINATION:   Physical Exam  GENERAL:  62 y.o.-year-old patient lying in bed in no acute distress.  EYES: Pupils equal, round, reactive to light and accommodation. No scleral icterus. Extraocular muscles intact.   HEENT: Head atraumatic, normocephalic. Oropharynx and nasopharynx clear.  NECK:  Supple, no jugular venous distention. No thyroid enlargement, no tenderness.  LUNGS: Good air entry bilaterally, diffuse end expiratory wheezing bilaterally, no rales, rhonchi.  Negative use of accessory muscles. CARDIOVASCULAR: S1, S2 normal. No murmurs, rubs, or gallops.  ABDOMEN: Soft, nontender, nondistended. Bowel sounds present. No organomegaly or mass.  EXTREMITIES: No cyanosis, clubbing or edema b/l.    NEUROLOGIC: Cranial nerves II through XII are intact. No focal Motor or sensory deficits b/l.   PSYCHIATRIC: The patient is alert and oriented x 3.  SKIN: No obvious rash, lesion, or ulcer.    LABORATORY PANEL:   CBC Recent Labs  Lab 12/09/17 2359  WBC 13.5*  HGB 12.6  HCT 36.1  PLT 236   ------------------------------------------------------------------------------------------------------------------  Chemistries  Recent Labs  Lab 12/09/17 2359  NA 138  K 4.0  CL 102  CO2 29  GLUCOSE 98  BUN 8  CREATININE 0.90  CALCIUM 9.1   ------------------------------------------------------------------------------------------------------------------  Cardiac Enzymes Recent Labs  Lab 12/09/17 2359  TROPONINI <0.03   ------------------------------------------------------------------------------------------------------------------  RADIOLOGY:  Dg Chest Port 1 View  Result Date: 12/10/2017 CLINICAL DATA:  Dyspnea EXAM: PORTABLE CHEST 1 VIEW COMPARISON:  07/27/2015 FINDINGS: Cardiac shadow is mildly enlarged but accentuated by the portable technique. The lungs are clear bilaterally. No acute bony abnormality is seen. IMPRESSION: No active disease. Electronically Signed   By: Inez Catalina M.D.   On: 12/10/2017 00:19     ASSESSMENT AND PLAN:   62 year old female with past medical history of essential hypertension, hypothyroidism, COPD, anxiety who presents to the hospital  due to shortness  of breath and noted to be in acute COPD exacerbation.  1.  Acute COPD exacerbation-cause of patient's worsening shortness of breath and wheezing. - cont. IV steroids but will taper, cont. Duonebs, Pulmicort nebs.  - CXR (-) on admission.   2. Essential HtN - cont. Norvasc.   3. Hyperlipidemia - cont. ATorvastatin  4. Hypothyroidism - cont. Synthroid.   5. Anxiety - cont. Klonopin.   6. Hx of Migraines - cont. Fiorcet PRN.  No acute headache.   All the records are reviewed and case discussed with Care Management/Social Worker. Management plans discussed with the patient, family and they are in agreement.  CODE STATUS: Full code  DVT Prophylaxis: Lovenox  TOTAL TIME TAKING CARE OF THIS PATIENT: 30 minutes.   POSSIBLE D/C IN 1-2 DAYS, DEPENDING ON CLINICAL CONDITION.   Henreitta Leber M.D on 12/11/2017 at 2:44 PM  Between 7am to 6pm - Pager - [phone]  After 6pm go to [email or web address] - Proofreader  [facility]  Office  [phone]  CC: Primary care physician; Lorelee Market, MD

## 2017-12-12 MED ORDER — HYDROCOD POLST-CPM POLST ER 10-8 MG/5ML PO SUER: 5.0000 mL | Freq: Two times a day (BID) | ORAL | 0 refills | Status: AC

## 2017-12-12 MED ORDER — BUDESONIDE 0.5 MG/2ML IN SUSP: 0.5000 mg | Freq: Two times a day (BID) | RESPIRATORY_TRACT | 1 refills | Status: DC

## 2017-12-12 MED ORDER — PREDNISONE 10 MG PO TABS: ORAL_TABLET | ORAL | 0 refills | Status: DC

## 2017-12-12 NOTE — Care Management Note (Signed)
Case Management Note  Patient Details  Name: Audrey Campbell MRN: [id number] Date of Birth: 04/28/1956   Patient admitted from home with COPD.  Patient lives at home with husband.  PCP Brunetta Genera.  Denies issues with transportation or obtaining medications.  Patient has nocturnal O2 with [facility].  Patient with qualifying sats and orders with continuous O2.  Caryl Pina with [facility] notified.  Portable O2 to be delivered prior to discharge.  Patient declines any home health services at discharge.  RNCM signing off.    Subjective/Objective:                    Action/Plan:   Expected Discharge Date:  12/12/17               Expected Discharge Plan:  Home/Self Care  In-House Referral:     Discharge planning Services  CM Consult  Post Acute Care Choice:  Durable Medical Equipment Choice offered to:  Patient  DME Arranged:  Oxygen DME Agency:  [facility]  HH Arranged:    [place] Agency:     Status of Service:  Completed, signed off  If discussed at [place] of Stay Meetings, dates discussed:    Additional Comments:  Beverly Sessions, RN 12/12/2017, 1:47 PM

## 2017-12-12 NOTE — Discharge Summary (Signed)
[facility] NAME: Audrey Campbell    MR#:  [id number]  DATE OF BIRTH:  Dec 15, 1955  DATE OF ADMISSION:  12/09/2017 ADMITTING PHYSICIAN: Harrie Foreman, MD  DATE OF DISCHARGE: 12/12/2017  3:22 PM  PRIMARY CARE PHYSICIAN: Lorelee Market, MD    ADMISSION DIAGNOSIS:  COPD with acute exacerbation ([place]) [J44.1]  DISCHARGE DIAGNOSIS:  Active Problems:   Acute respiratory failure with hypoxemia ([place])   SECONDARY DIAGNOSIS:   Past Medical History:  Diagnosis Date  . Anxiety   . Arthritis   . COPD (chronic obstructive pulmonary disease) ([place])   . HBP (high blood pressure)   . High blood pressure   . Hypothyroidism   . Plantar fasciitis     HOSPITAL COURSE:   62 year old female with past medical history of essential hypertension, hypothyroidism, COPD, anxiety who presents to the hospital due to shortness of breath and noted to be in acute COPD exacerbation.  1.  Acute COPD exacerbation- this was the cause of patient's worsening shortness of breath and wheezing. - Patient was treated with IV steroids, scheduled duo nebs and Pulmicort nebs.  Patient has clinically improved and has less wheezing and bronchospasm.  She was strongly advised to abstain from smoking. - Now being discharged on oral prednisone taper, maintenance of her inhalers including Symbicort and Spiriva. -She did qualify for home oxygen which was arranged for her prior to discharge. -Patient will also use her nebulizers and was given a prescription for Pulmicort nebs 2 prior to discharge.  2. Essential HtN -will cont. Norvasc.   3. Hyperlipidemia -we will cont. ATorvastatin  4. Hypothyroidism - she will cont. Synthroid.   5. Anxiety - she will cont. Klonopin.   6. Hx of Migraines - she will cont. Fiorcet PRN.  No acute migraines while in the hospital.   DISCHARGE CONDITIONS:   Stable  CONSULTS OBTAINED:    DRUG ALLERGIES:   Allergies   Allergen Reactions  . Penicillins Rash and Other (See Comments)    Has patient had a PCN reaction causing immediate rash, facial/tongue/throat swelling, SOB or lightheadedness with hypotension: No Has patient had a PCN reaction causing severe rash involving mucus membranes or skin necrosis: No Has patient had a PCN reaction that required hospitalization: No Has patient had a PCN reaction occurring within the last 10 years: Yes If all of the above answers are "NO", then may proceed with Cephalosporin use.     DISCHARGE MEDICATIONS:   Allergies as of 12/12/2017      Reactions   Penicillins Rash, Other (See Comments)   Has patient had a PCN reaction causing immediate rash, facial/tongue/throat swelling, SOB or lightheadedness with hypotension: No Has patient had a PCN reaction causing severe rash involving mucus membranes or skin necrosis: No Has patient had a PCN reaction that required hospitalization: No Has patient had a PCN reaction occurring within the last 10 years: Yes If all of the above answers are "NO", then may proceed with Cephalosporin use.      Medication List    STOP taking these medications   levofloxacin 500 MG tablet Commonly known as:  LEVAQUIN     TAKE these medications   AMITIZA 24 MCG capsule Generic drug:  lubiprostone Take 24 mcg by mouth 2 (two) times daily with a meal.   amLODipine 10 MG tablet Commonly known as:  NORVASC Take 10 mg by mouth 2 (two) times daily.   atorvastatin 40 MG tablet Commonly known  as:  LIPITOR Take 40 mg by mouth daily.   budesonide 0.5 MG/2ML nebulizer solution Commonly known as:  PULMICORT Take 2 mLs (0.5 mg total) by nebulization 2 (two) times daily.   butalbital-acetaminophen-caffeine 50-325-40 MG tablet Commonly known as:  FIORICET, ESGIC Take 1 tablet by mouth every 6 (six) hours as needed for headache.   chlorpheniramine-HYDROcodone 10-8 MG/5ML Suer Commonly known as:  TUSSIONEX Take 5 mLs by mouth every 12  (twelve) hours for 14 days.   clonazePAM 0.5 MG tablet Commonly known as:  KLONOPIN Take 0.5 mg by mouth daily.   gabapentin 400 MG capsule Commonly known as:  NEURONTIN Take 400 mg by mouth 4 (four) times daily.   HYDROcodone-acetaminophen 10-325 MG tablet Commonly known as:  NORCO Take 1 tablet by mouth every 8 (eight) hours as needed for pain. What changed:    when to take this  reasons to take this   levothyroxine 150 MCG tablet Commonly known as:  SYNTHROID, LEVOTHROID Take 150 mcg by mouth daily before breakfast.   meloxicam 7.5 MG tablet Commonly known as:  MOBIC Take 7.5 mg by mouth 2 (two) times daily.   montelukast 10 MG tablet Commonly known as:  SINGULAIR Take 10 mg by mouth at bedtime.   predniSONE 10 MG tablet Commonly known as:  DELTASONE Label  & dispense according to the schedule below. 5 Pills PO for 1 day then, 4 Pills PO for 1 day, 3 Pills PO for 1 day, 2 Pills PO for 1 day, 1 Pill PO for 1 days then STOP.   PROVENTIL HFA 108 (90 Base) MCG/ACT inhaler Generic drug:  albuterol Inhale 2 puffs into the lungs every 6 (six) hours as needed for wheezing or shortness of breath.   QUEtiapine 100 MG tablet Commonly known as:  SEROQUEL Take 100 mg by mouth at bedtime as needed (sleep).   SPIRIVA HANDIHALER 18 MCG inhalation capsule Generic drug:  tiotropium Place 18 mcg into inhaler and inhale daily.   SYMBICORT 160-4.5 MCG/ACT inhaler Generic drug:  budesonide-formoterol Inhale 2 puffs into the lungs 2 (two) times daily.   Vitamin D-3 5000 units Tabs Take 5,000 Units by mouth once a week. Takes on Monday            [place]  (From admission, onward)         Start     Ordered   12/12/17 1043  For home use only DME oxygen  Once    Question Answer Comment  Mode or (Route) Nasal cannula   Liters per Minute 2   Frequency Continuous (stationary and portable oxygen unit needed)   Oxygen conserving device Yes   Oxygen delivery  system Gas      12/12/17 1042            DISCHARGE INSTRUCTIONS:   DIET:  Cardiac diet  DISCHARGE CONDITION:  Stable  ACTIVITY:  Activity as tolerated  OXYGEN:  Home Oxygen: Yes.     Oxygen Delivery: 2 liters/min via Patient connected to nasal cannula oxygen  DISCHARGE LOCATION:  home   If you experience worsening of your admission symptoms, develop shortness of breath, life threatening emergency, suicidal or homicidal thoughts you must seek medical attention immediately by calling 911 or calling your MD immediately  if symptoms less severe.  You Must read complete instructions/literature along with all the possible adverse reactions/side effects for all the Medicines you take and that have been prescribed to you. Take any new Medicines after you  have completely understood and accpet all the possible adverse reactions/side effects.   Please note  You were cared for by a hospitalist during your hospital stay. If you have any questions about your discharge medications or the care you received while you were in the hospital after you are discharged, you can call the unit and asked to speak with the hospitalist on call if the hospitalist that took care of you is not available. Once you are discharged, your primary care physician will handle any further medical issues. Please note that NO REFILLS for any discharge medications will be authorized once you are discharged, as it is imperative that you return to your primary care physician (or establish a relationship with a primary care physician if you do not have one) for your aftercare needs so that they can reassess your need for medications and monitor your lab values.     Today   Wheezing, shortness of breath much improved since admission.  Will discharge home today.  Patient did qualify for home oxygen which was arranged for her prior to discharge.  VITAL SIGNS:  Blood pressure (!) 148/77, pulse 66, temperature 97.6 F  (36.4 C), temperature source Oral, resp. rate 18, height 5\' 5"  (1.651 m), weight 93.1 kg, SpO2 93 %.  I/O:    Intake/Output Summary (Last 24 hours) at 12/12/2017 1646 Last data filed at 12/12/2017 1343 Gross per 24 hour  Intake 840 ml  Output 1100 ml  Net -260 ml    PHYSICAL EXAMINATION:  GENERAL:  62 y.o.-year-old patient lying in the bed with no acute distress.  EYES: Pupils equal, round, reactive to light and accommodation. No scleral icterus. Extraocular muscles intact.  HEENT: Head atraumatic, normocephalic. Oropharynx and nasopharynx clear.  NECK:  Supple, no jugular venous distention. No thyroid enlargement, no tenderness.  LUNGS: Normal breath sounds bilaterally, minimal end-exp. Wheezing b/l, No rales,rhonchi. No use of accessory muscles of respiration.  CARDIOVASCULAR: S1, S2 normal. No murmurs, rubs, or gallops.  ABDOMEN: Soft, non-tender, non-distended. Bowel sounds present. No organomegaly or mass.  EXTREMITIES: No pedal edema, cyanosis, or clubbing.  NEUROLOGIC: Cranial nerves II through XII are intact. No focal motor or sensory defecits b/l.  PSYCHIATRIC: The patient is alert and oriented x 3.  SKIN: No obvious rash, lesion, or ulcer.   DATA REVIEW:   CBC Recent Labs  Lab 12/09/17 2359  WBC 13.5*  HGB 12.6  HCT 36.1  PLT 236    Chemistries  Recent Labs  Lab 12/09/17 2359  NA 138  K 4.0  CL 102  CO2 29  GLUCOSE 98  BUN 8  CREATININE 0.90  CALCIUM 9.1    Cardiac Enzymes Recent Labs  Lab 12/09/17 2359  TROPONINI <0.03    Microbiology Results  No results found for this or any previous visit.  RADIOLOGY:  No results found.    Management plans discussed with the patient, family and they are in agreement.  CODE STATUS:     Code Status Orders  (From admission, onward)         Start     Ordered   12/10/17 1305  Full code  Continuous     12/10/17 1304         TOTAL TIME TAKING CARE OF THIS PATIENT: 40 minutes.    Henreitta Leber M.D on 12/12/2017 at 4:46 PM  Between 7am to 6pm - Pager - [phone]  After 6pm go to [email or web address] - [facility]  Office  [phone]  CC: Primary care physician; Lorelee Market, MD

## 2017-12-12 NOTE — Progress Notes (Signed)
Discharge instructions explained to pt and pts spouse/ verbalized an understanding/ iv and tele removed/ RX given to pt/ 02 tank delivered for home use/ transported off unit via wheelchair.

## 2017-12-12 NOTE — Progress Notes (Signed)
SATURATION QUALIFICATIONS: (This note is used to comply with regulatory documentation for home oxygen)  Patient Saturations on Room Air at Rest =91%  Patient Saturations on Room Air while Ambulating = 82%  Patient Saturations on 2 Liters of oxygen while Ambulating =93%  Please briefly explain why patient needs home oxygen: 

## 2018-07-20 ENCOUNTER — Encounter: Payer: BLUE CROSS/BLUE SHIELD | Admitting: Certified Nurse Midwife

## 2019-01-29 ENCOUNTER — Other Ambulatory Visit: Payer: Self-pay | Admitting: Family Medicine

## 2019-01-29 DIAGNOSIS — Z1231 Encounter for screening mammogram for malignant neoplasm of breast: Secondary | ICD-10-CM

## 2019-03-17 ENCOUNTER — Ambulatory Visit
Admission: RE | Admit: 2019-03-17 | Discharge: 2019-03-17 | Disposition: A | Discharge status: Home / Self Care | Payer: Medicare Other | Source: Ambulatory Visit | Attending: Family Medicine | Admitting: Family Medicine

## 2019-03-17 DIAGNOSIS — Z1231 Encounter for screening mammogram for malignant neoplasm of breast: Secondary | ICD-10-CM | POA: Diagnosis present

## 2019-05-14 DIAGNOSIS — Z20822 Contact with and (suspected) exposure to covid-19: Secondary | ICD-10-CM | POA: Diagnosis not present

## 2019-05-14 DIAGNOSIS — R05 Cough: Secondary | ICD-10-CM | POA: Diagnosis not present

## 2019-05-14 DIAGNOSIS — J40 Bronchitis, not specified as acute or chronic: Secondary | ICD-10-CM | POA: Diagnosis not present

## 2019-05-14 DIAGNOSIS — M791 Myalgia, unspecified site: Secondary | ICD-10-CM | POA: Diagnosis not present

## 2019-05-20 DIAGNOSIS — J449 Chronic obstructive pulmonary disease, unspecified: Secondary | ICD-10-CM | POA: Diagnosis not present

## 2019-05-21 DIAGNOSIS — B379 Candidiasis, unspecified: Secondary | ICD-10-CM | POA: Diagnosis not present

## 2019-05-21 DIAGNOSIS — M542 Cervicalgia: Secondary | ICD-10-CM | POA: Diagnosis not present

## 2019-05-21 DIAGNOSIS — J209 Acute bronchitis, unspecified: Secondary | ICD-10-CM | POA: Diagnosis not present

## 2019-05-26 DIAGNOSIS — M542 Cervicalgia: Secondary | ICD-10-CM | POA: Diagnosis not present

## 2019-05-26 DIAGNOSIS — Z79891 Long term (current) use of opiate analgesic: Secondary | ICD-10-CM | POA: Diagnosis not present

## 2019-05-26 DIAGNOSIS — G894 Chronic pain syndrome: Secondary | ICD-10-CM | POA: Diagnosis not present

## 2019-05-26 DIAGNOSIS — M25512 Pain in left shoulder: Secondary | ICD-10-CM | POA: Diagnosis not present

## 2019-05-26 DIAGNOSIS — Z720 Tobacco use: Secondary | ICD-10-CM | POA: Diagnosis not present

## 2019-06-16 DIAGNOSIS — J209 Acute bronchitis, unspecified: Secondary | ICD-10-CM | POA: Diagnosis not present

## 2019-06-16 DIAGNOSIS — J449 Chronic obstructive pulmonary disease, unspecified: Secondary | ICD-10-CM | POA: Diagnosis not present

## 2019-06-16 DIAGNOSIS — N760 Acute vaginitis: Secondary | ICD-10-CM | POA: Diagnosis not present

## 2019-06-16 DIAGNOSIS — F17200 Nicotine dependence, unspecified, uncomplicated: Secondary | ICD-10-CM | POA: Diagnosis not present

## 2019-06-16 DIAGNOSIS — Z1389 Encounter for screening for other disorder: Secondary | ICD-10-CM | POA: Diagnosis not present

## 2019-06-20 DIAGNOSIS — J449 Chronic obstructive pulmonary disease, unspecified: Secondary | ICD-10-CM | POA: Diagnosis not present

## 2019-07-07 DIAGNOSIS — M25512 Pain in left shoulder: Secondary | ICD-10-CM | POA: Diagnosis not present

## 2019-07-07 DIAGNOSIS — M542 Cervicalgia: Secondary | ICD-10-CM | POA: Diagnosis not present

## 2019-07-07 DIAGNOSIS — Z79891 Long term (current) use of opiate analgesic: Secondary | ICD-10-CM | POA: Diagnosis not present

## 2019-07-07 DIAGNOSIS — G894 Chronic pain syndrome: Secondary | ICD-10-CM | POA: Diagnosis not present

## 2019-07-07 DIAGNOSIS — Z720 Tobacco use: Secondary | ICD-10-CM | POA: Diagnosis not present

## 2019-07-08 ENCOUNTER — Ambulatory Visit: Payer: BLUE CROSS/BLUE SHIELD | Admitting: Family Medicine

## 2019-07-09 ENCOUNTER — Ambulatory Visit (INDEPENDENT_AMBULATORY_CARE_PROVIDER_SITE_OTHER): Payer: Medicare Other | Admitting: Family Medicine

## 2019-07-09 ENCOUNTER — Encounter: Payer: Self-pay | Admitting: Family Medicine

## 2019-07-09 ENCOUNTER — Other Ambulatory Visit: Payer: Self-pay

## 2019-07-09 VITALS — BP 138/70 | Temp 96.9°F | Resp 16 | Ht 65.0 in | Wt 202.0 lb

## 2019-07-09 DIAGNOSIS — F339 Major depressive disorder, recurrent, unspecified: Secondary | ICD-10-CM

## 2019-07-09 DIAGNOSIS — M545 Low back pain, unspecified: Secondary | ICD-10-CM | POA: Insufficient documentation

## 2019-07-09 DIAGNOSIS — M25561 Pain in right knee: Secondary | ICD-10-CM

## 2019-07-09 DIAGNOSIS — Z1211 Encounter for screening for malignant neoplasm of colon: Secondary | ICD-10-CM

## 2019-07-09 DIAGNOSIS — M4722 Other spondylosis with radiculopathy, cervical region: Secondary | ICD-10-CM

## 2019-07-09 DIAGNOSIS — B370 Candidal stomatitis: Secondary | ICD-10-CM | POA: Diagnosis not present

## 2019-07-09 DIAGNOSIS — G894 Chronic pain syndrome: Secondary | ICD-10-CM

## 2019-07-09 DIAGNOSIS — F411 Generalized anxiety disorder: Secondary | ICD-10-CM | POA: Insufficient documentation

## 2019-07-09 DIAGNOSIS — Z7689 Persons encountering health services in other specified circumstances: Secondary | ICD-10-CM | POA: Diagnosis not present

## 2019-07-09 DIAGNOSIS — E039 Hypothyroidism, unspecified: Secondary | ICD-10-CM

## 2019-07-09 DIAGNOSIS — F3341 Major depressive disorder, recurrent, in partial remission: Secondary | ICD-10-CM | POA: Insufficient documentation

## 2019-07-09 DIAGNOSIS — M8949 Other hypertrophic osteoarthropathy, multiple sites: Secondary | ICD-10-CM

## 2019-07-09 DIAGNOSIS — G8929 Other chronic pain: Secondary | ICD-10-CM | POA: Insufficient documentation

## 2019-07-09 DIAGNOSIS — M159 Polyosteoarthritis, unspecified: Secondary | ICD-10-CM | POA: Insufficient documentation

## 2019-07-09 DIAGNOSIS — J432 Centrilobular emphysema: Secondary | ICD-10-CM | POA: Insufficient documentation

## 2019-07-09 DIAGNOSIS — F5104 Psychophysiologic insomnia: Secondary | ICD-10-CM | POA: Insufficient documentation

## 2019-07-09 MED ORDER — FLUCONAZOLE 150 MG PO TABS: ORAL_TABLET | ORAL | 0 refills | Status: DC

## 2019-07-09 MED ORDER — GABAPENTIN 400 MG PO CAPS
400.0000 mg | ORAL_CAPSULE | Freq: Four times a day (QID) | ORAL | 1 refills | Status: DC
Start: 2019-07-09 — End: 2019-10-06

## 2019-07-09 NOTE — Patient Instructions (Addendum)
Thank you for coming to the office today.  Take Diflucan for oral thrush, skip 1 dose then repeat dose next day.  Keep checking with [facility]  ----------------------------------------  [place] COVID19 Vaccine Information  LOCATION:  [facility] [address] [place] [address]  Hours: Monday - Sunday 8:00am to 12:00pm  COVID-19 Vaccines By Appointment Only  Sign up for [place] Vaccine - Wait List  [email or web address] or call [phone]  Colon Cancer Screening: - For all adults age 50+ routine colon cancer screening is highly recommended.     - Recent guidelines from American [employer] Society recommend starting age of 45 - Early detection of colon cancer is important, because often there are no warning signs or symptoms, also if found early usually it can be cured. Late stage is hard to treat.  - If you are not interested in Colonoscopy screening (if done and normal you could be cleared for 5 to 10 years until next due), then Cologuard is an excellent alternative for screening test for Colon Cancer. It is highly sensitive for detecting DNA of colon cancer from even the earliest stages. Also, there is NO bowel prep required. - If Cologuard is NEGATIVE, then it is good for 3 years before next due - If Cologuard is POSITIVE, then it is strongly advised to get a Colonoscopy, which allows the GI doctor to locate the source of the cancer or polyp (even very early stage) and treat it by removing it. ------------------------- Also, keep in mind if you do NOT open the kit, and decide not to do the test, you will NOT be charged, you should contact the company if you decide not to do the test. ORDERED COLOGUARD TODAY Follow instructions to collect sample, you may call the company for any help or questions, 24/7 telephone support at [phone].   Please schedule a Follow-up Appointment to: Return in about  3 months (around 10/09/2019) for 3 months follow-up med refills, COPD, PreDM Arthritis, due for labs after.  If you have any other questions or concerns, please feel free to call the office or send a message through MyChart. You may also schedule an earlier appointment if necessary.  Additionally, you may be receiving a survey about your experience at our office within a few days to 1 week by e-mail or mail. We value your feedback.   , DO [facility], [facility] 

## 2019-07-09 NOTE — Progress Notes (Signed)
Subjective:    Patient ID: Audrey Campbell, female    DOB: [birth year], 64 y.o.   MRN: [id number]  Audrey Campbell is a 64 y.o. female presenting on 07/09/2019 for Establish Care (thrush onset 2 month), Osteoarthritis, and Depression  Accompanied with Tacey Heap  HPI   Oral Thrush - Recurrent in past 2 months, antibiotic/steroid triggered, also on symbicort, cleared with Diflucan now returned. Dentist gave her anti fungal mouthwash hasn't filled yet.  Osteoarthritis / Multiple sites, low back and R knee Uses topical voltaren gel PRN Followed by [facility] in [place], on Hydrocodone 10/325 and Clonazepam PRN Needs refill Gabapentin 400 QID  Centrilobular Emphysema Chronic problem. Smoker. On maintenance therapy with improvement.  Obesity BMI >33  Recurrent Depression, Chronic / Anxiety / Insomnia Previously managed by prior PCP. Describes anxiety and panic history. Now has insomnia - Takes Seroquel nightly with improvement - Takes Clonazepam regularly nightly to help sleep, or PRN acute panic, rx from Pain Clinic.  Health Maintenance: Colorectal Cancer Screening - due. Has not had colonoscopy or cologuard. Interested in [employer] today.  Depression screen PHQ 2/9 07/09/2019  Decreased Interest 1  Down, Depressed, Hopeless 0  PHQ - 2 Score 1  Altered sleeping 2  Tired, decreased energy 1  Change in appetite 1  Feeling bad or failure about yourself  0  Trouble concentrating 0  Moving slowly or fidgety/restless 0  Suicidal thoughts 0  PHQ-9 Score 5  Difficult doing work/chores Somewhat difficult    Past Medical History:  Diagnosis Date  . Anxiety   . Arthritis   . COPD (chronic obstructive pulmonary disease) ([place])   . HBP (high blood pressure)   . High blood pressure   . Hyperlipidemia   . Hypothyroidism   . Plantar fasciitis    Past Surgical History:  Procedure Laterality Date  . bulging disc in the neck    . DILATION AND CURETTAGE OF UTERUS      . FOOT SURGERY Bilateral   . heel  fasciitis    . RHINOPLASTY    . SHOULDER SURGERY    . TUBAL LIGATION     Social History   Socioeconomic History  . Marital status: Widowed    Spouse name: Not on file  . Number of children: Not on file  . Years of education: Not on file  . Highest education level: Not on file  Occupational History  . Not on file  Tobacco Use  . Smoking status: Current Every Day Smoker    Packs/day: 0.50    Types: Cigarettes  . Smokeless tobacco: Current User  Substance and Sexual Activity  . Alcohol use: No  . Drug use: No  . Sexual activity: Yes    Birth control/protection: None  Other Topics Concern  . Not on file  Social History Narrative  . Not on file   Social Determinants of Health   Financial Resource Strain:   . Difficulty of Paying Living Expenses:   Food Insecurity:   . Worried About Charity fundraiser in the Last Year:   . Arboriculturist in the Last Year:   Transportation Needs:   . Film/video editor (Medical):   Marland Kitchen Lack of Transportation (Non-Medical):   Physical Activity:   . Days of Exercise per Week:   . Minutes of Exercise per Session:   Stress:   . Feeling of Stress :   Social Connections:   . Frequency of Communication with Friends and  Family:   . Frequency of Social Gatherings with Friends and Family:   . Attends Religious Services:   . Active Member of Clubs or Organizations:   . Attends Archivist Meetings:   Marland Kitchen Marital Status:   Intimate Partner Violence:   . Fear of Current or Ex-Partner:   . Emotionally Abused:   Marland Kitchen Physically Abused:   . Sexually Abused:    Family History  Problem Relation Age of Onset  . Diabetes Father   . Seizures Sister   . Breast cancer Neg Hx    Current Outpatient Medications on File Prior to Visit  Medication Sig  . albuterol (PROVENTIL HFA) 108 (90 Base) MCG/ACT inhaler Inhale 2 puffs into the lungs every 6 (six) hours as needed for wheezing or shortness of breath.    . AMITIZA 24 MCG capsule Take 24 mcg by mouth 2 (two) times daily with a meal.   . amLODipine (NORVASC) 10 MG tablet Take 10 mg by mouth 2 (two) times daily.   Marland Kitchen atorvastatin (LIPITOR) 40 MG tablet Take 40 mg by mouth daily.  . budesonide-formoterol (SYMBICORT) 160-4.5 MCG/ACT inhaler Inhale 2 puffs into the lungs 2 (two) times daily.   . butalbital-acetaminophen-caffeine (FIORICET, ESGIC) 50-325-40 MG tablet Take 1 tablet by mouth every 6 (six) hours as needed for headache.   . Cholecalciferol (VITAMIN D-3) 5000 UNITS TABS Take 5,000 Units by mouth once a week. Takes on Monday  . clonazePAM (KLONOPIN) 0.5 MG tablet Take 0.5 mg by mouth daily.   Marland Kitchen HYDROcodone-acetaminophen (NORCO) 10-325 MG tablet Take 1 tablet by mouth every 6 (six) hours as needed.  Marland Kitchen levothyroxine (SYNTHROID, LEVOTHROID) 150 MCG tablet Take 150 mcg by mouth daily before breakfast.   . meloxicam (MOBIC) 7.5 MG tablet Take 7.5 mg by mouth 2 (two) times daily.  . montelukast (SINGULAIR) 10 MG tablet Take 10 mg by mouth at bedtime.   Marland Kitchen QUEtiapine (SEROQUEL) 100 MG tablet Take 100 mg by mouth at bedtime as needed (sleep).   . budesonide (PULMICORT) 0.5 MG/2ML nebulizer solution Take 2 mLs (0.5 mg total) by nebulization 2 (two) times daily.  . diclofenac Sodium (VOLTAREN) 1 % GEL   . SPIRIVA HANDIHALER 18 MCG inhalation capsule Place 18 mcg into inhaler and inhale daily.    No current facility-administered medications on file prior to visit.    Review of Systems Per HPI unless specifically indicated above      Objective:    Temp (!) 96.9 F (36.1 C) (Temporal)   Resp 16   Ht 5\' 5"  (1.651 m)   Wt 202 lb (91.6 kg)   SpO2 93%   BMI 33.61 kg/m   Wt Readings from Last 3 Encounters:  07/09/19 202 lb (91.6 kg)  12/12/17 205 lb 3.2 oz (93.1 kg)  07/16/17 203 lb 8 oz (92.3 kg)    Physical Exam Vitals and nursing note reviewed.  Constitutional:      General: She is not in acute distress.    Appearance: She is  well-developed. She is not diaphoretic.     Comments: Well-appearing, comfortable, cooperative  HENT:     Head: Normocephalic and atraumatic.  Eyes:     General:        Right eye: No discharge.        Left eye: No discharge.     Conjunctiva/sclera: Conjunctivae normal.  Neck:     Thyroid: No thyromegaly.  Cardiovascular:     Rate and Rhythm: Normal rate and regular rhythm.  Heart sounds: Normal heart sounds. No murmur.  Pulmonary:     Effort: Pulmonary effort is normal. No respiratory distress.     Breath sounds: No wheezing or rales.     Comments: Mild reduced air movement diffuse Musculoskeletal:        General: Normal range of motion.     Cervical back: Normal range of motion and neck supple.  Lymphadenopathy:     Cervical: No cervical adenopathy.  Skin:    General: Skin is warm and dry.     Findings: No erythema or rash.  Neurological:     Mental Status: She is alert and oriented to person, place, and time.  Psychiatric:        Behavior: Behavior normal.     Comments: Well groomed, good eye contact, normal speech and thoughts    Results for orders placed or performed during the hospital encounter of 12/09/17  CBC with Differential  Result Value Ref Range   WBC 13.5 (H) 3.6 - 11.0 K/uL   RBC 4.32 3.80 - 5.20 MIL/uL   Hemoglobin 12.6 12.0 - 16.0 g/dL   HCT 36.1 35.0 - 47.0 %   MCV 83.6 80.0 - 100.0 fL   MCH 29.1 26.0 - 34.0 pg   MCHC 34.8 32.0 - 36.0 g/dL   RDW 15.7 (H) 11.5 - 14.5 %   Platelets 236 150 - 440 K/uL   Neutrophils Relative % 50 %   Lymphocytes Relative 25 %   Monocytes Relative 3 %   Eosinophils Relative 19 %   Basophils Relative 0 %   Band Neutrophils 1 %   Metamyelocytes Relative 2 %   Myelocytes 0 %   Promyelocytes Relative 0 %   Blasts 0 %   nRBC 0 0 /100 WBC   Other 0 %   Neutro Abs 7.1 (H) 1.4 - 6.5 K/uL   Lymphs Abs 3.4 1.0 - 3.6 K/uL   Monocytes Absolute 0.4 0.2 - 0.9 K/uL   Eosinophils Absolute 2.6 (H) 0 - 0.7 K/uL   Basophils  Absolute 0.0 0 - 0.1 K/uL   RBC Morphology MIXED RBC POPULATION   Basic metabolic panel  Result Value Ref Range   Sodium 138 135 - 145 mmol/L   Potassium 4.0 3.5 - 5.1 mmol/L   Chloride 102 98 - 111 mmol/L   CO2 29 22 - 32 mmol/L   Glucose, Bld 98 70 - 99 mg/dL   BUN 8 8 - 23 mg/dL   Creatinine, Ser 0.90 0.44 - 1.00 mg/dL   Calcium 9.1 8.9 - 10.3 mg/dL   GFR calc non Af Amer >60 >60 mL/min   GFR calc Af Amer >60 >60 mL/min   Anion gap 7 5 - 15  Brain natriuretic peptide  Result Value Ref Range   B Natriuretic Peptide 57.0 0.0 - 100.0 pg/mL  Troponin I  Result Value Ref Range   Troponin I <0.03 <0.03 ng/mL  Blood gas, venous  Result Value Ref Range   pH, Ven 7.38 7.250 - 7.430   pCO2, Ven 50 44.0 - 60.0 mmHg   pO2, Ven 38.0 32.0 - 45.0 mmHg   Bicarbonate 29.6 (H) 20.0 - 28.0 mmol/L   Acid-Base Excess 3.6 (H) 0.0 - 2.0 mmol/L   O2 Saturation 70.8 %   Patient temperature 37.0    Collection site VENOUS    Sample type VENOUS   TSH  Result Value Ref Range   TSH 3.764 0.350 - 4.500 uIU/mL  Hemoglobin A1c  Result  Value Ref Range   Hgb A1c MFr Bld 5.8 (H) 4.8 - 5.6 %   Mean Plasma Glucose 119.76 mg/dL      Assessment & Plan:   Problem List Items Addressed This Visit    Psychophysiological insomnia   Primary osteoarthritis involving multiple joints   Relevant Medications   HYDROcodone-acetaminophen (NORCO) 10-325 MG tablet   gabapentin (NEURONTIN) 400 MG capsule   Major depression, recurrent, chronic (HCC) - Primary   Hypothyroidism   GAD (generalized anxiety disorder)   Chronic pain of right knee   Relevant Medications   HYDROcodone-acetaminophen (NORCO) 10-325 MG tablet   gabapentin (NEURONTIN) 400 MG capsule   Chronic bilateral low back pain without sciatica   Relevant Medications   HYDROcodone-acetaminophen (NORCO) 10-325 MG tablet   Centrilobular emphysema ([place])    Other Visit Diagnoses    Oral thrush       Relevant Medications   fluconazole (DIFLUCAN) 150  MG tablet   Encounter to establish care with new doctor       Screening for colon cancer       Relevant Orders   Cologuard   Chronic pain syndrome       Relevant Medications   HYDROcodone-acetaminophen (NORCO) 10-325 MG tablet   gabapentin (NEURONTIN) 400 MG capsule   Osteoarthritis of spine with radiculopathy, cervical region       Relevant Medications   HYDROcodone-acetaminophen (NORCO) 10-325 MG tablet   gabapentin (NEURONTIN) 400 MG capsule      Review outside records provided by prior PCP Dr Brunetta Genera, reviewed paper records today. Will review and scan to chart.   #Osteoarthritis, multiple joints, R knee, low back Chronic problem Improved currently on Voltaren topical PRN, Gabapentin (refilled gabapentin) On pain management per [place]  #Depression, major recurrent / Anxiety / Insomnia Chronic problem, currently stable See PHQ GAD On Seroquel, not ready for refill, notify when ready On Clonazepam, per [facility], checked PDMP  #Hypothyroidism Controlled based on prior labs Will re-check thyroid panel in 3 months after visit  #Centrilobular Emphysema Tobacco Abuse, smoker On maintenance therapy Likely secondary oral thrush from oral steroid - discussed rinsing mouth out after each use of symbicort. Rx Diflucan.  Due for routine colon cancer screening. Never had colonoscopy (not interested), no family history colon cancer. - Discussion today about recommendations for either Colonoscopy or Cologuard screening, benefits and risks of screening, interested in Cologuard, understands that if positive then recommendation is for diagnostic colonoscopy to follow-up. - Ordered Cologuard today   Meds ordered this encounter  Medications  . fluconazole (DIFLUCAN) 150 MG tablet    Sig: Take one tablet by mouth on Day 1. Repeat dose 2nd tablet on Day 3.    Dispense:  2 tablet    Refill:  0  . gabapentin (NEURONTIN) 400 MG capsule    Sig: Take 1 capsule (400 mg  total) by mouth 4 (four) times daily.    Dispense:  360 capsule    Refill:  1   Orders Placed This Encounter  Procedures  . Cologuard      Follow up plan: Return in about 3 months (around 10/09/2019) for 3 months follow-up med refills, COPD, PreDM Arthritis, due for labs after.   Nobie Putnam, [facility] 07/09/2019, 10:45 AM

## 2019-07-14 ENCOUNTER — Encounter: Payer: Self-pay | Admitting: Family Medicine

## 2019-07-15 ENCOUNTER — Ambulatory Visit: Payer: Medicare Other | Attending: Internal Medicine

## 2019-07-15 ENCOUNTER — Encounter: Payer: Self-pay | Admitting: Family Medicine

## 2019-07-15 DIAGNOSIS — Z23 Encounter for immunization: Secondary | ICD-10-CM

## 2019-07-15 NOTE — Progress Notes (Signed)
   Covid-19 Vaccination Clinic  Name:  Audrey Campbell    MRN: [id number] DOB: 1956/02/07  07/15/2019  [name] was observed post Covid-19 immunization for 15 minutes without incident. She was provided with Vaccine Information Sheet and instruction to access the V-Safe system.   [name] was instructed to call 911 with any severe reactions post vaccine: Marland Kitchen Difficulty breathing  . Swelling of face and throat  . A fast heartbeat  . A bad rash all over body  . Dizziness and weakness   Immunizations Administered    Name Date Dose VIS Date Route   Pfizer COVID-19 Vaccine 07/15/2019  9:15 AM 0.3 mL 04/09/2019 Intramuscular   Manufacturer: [place]   Lot: [id number]   [place]: [id number]

## 2019-07-18 DIAGNOSIS — J449 Chronic obstructive pulmonary disease, unspecified: Secondary | ICD-10-CM | POA: Diagnosis not present

## 2019-07-21 DIAGNOSIS — J449 Chronic obstructive pulmonary disease, unspecified: Secondary | ICD-10-CM | POA: Diagnosis not present

## 2019-08-11 ENCOUNTER — Ambulatory Visit: Payer: Medicare Other | Attending: Internal Medicine

## 2019-08-11 DIAGNOSIS — Z23 Encounter for immunization: Secondary | ICD-10-CM

## 2019-08-11 NOTE — Progress Notes (Signed)
   Covid-19 Vaccination Clinic  Name:  MANEH BA    MRN: [id number] DOB: 1955/12/13  08/11/2019  Ms. Carles was observed post Covid-19 immunization for 15 minutes without incident. She was provided with Vaccine Information Sheet and instruction to access the V-Safe system.   Ms. Langolf was instructed to call 911 with any severe reactions post vaccine: Marland Kitchen Difficulty breathing  . Swelling of face and throat  . A fast heartbeat  . A bad rash all over body  . Dizziness and weakness   Immunizations Administered    Name Date Dose VIS Date Route   Pfizer COVID-19 Vaccine 08/11/2019 10:13 AM 0.3 mL 04/09/2019 Intramuscular   Manufacturer: [employer]   Lot: [id number]   [place]: [id number]

## 2019-08-18 DIAGNOSIS — J449 Chronic obstructive pulmonary disease, unspecified: Secondary | ICD-10-CM | POA: Diagnosis not present

## 2019-09-15 DIAGNOSIS — Z720 Tobacco use: Secondary | ICD-10-CM | POA: Diagnosis not present

## 2019-09-15 DIAGNOSIS — M25512 Pain in left shoulder: Secondary | ICD-10-CM | POA: Diagnosis not present

## 2019-09-15 DIAGNOSIS — Z79899 Other long term (current) drug therapy: Secondary | ICD-10-CM | POA: Diagnosis not present

## 2019-09-15 DIAGNOSIS — M542 Cervicalgia: Secondary | ICD-10-CM | POA: Diagnosis not present

## 2019-09-15 DIAGNOSIS — Z79891 Long term (current) use of opiate analgesic: Secondary | ICD-10-CM | POA: Diagnosis not present

## 2019-09-15 DIAGNOSIS — G894 Chronic pain syndrome: Secondary | ICD-10-CM | POA: Diagnosis not present

## 2019-09-17 DIAGNOSIS — J449 Chronic obstructive pulmonary disease, unspecified: Secondary | ICD-10-CM | POA: Diagnosis not present

## 2019-09-25 DIAGNOSIS — N760 Acute vaginitis: Secondary | ICD-10-CM | POA: Diagnosis not present

## 2019-09-28 DIAGNOSIS — J449 Chronic obstructive pulmonary disease, unspecified: Secondary | ICD-10-CM | POA: Diagnosis not present

## 2019-10-06 ENCOUNTER — Encounter: Payer: Self-pay | Admitting: Family Medicine

## 2019-10-06 ENCOUNTER — Other Ambulatory Visit: Payer: Self-pay

## 2019-10-06 ENCOUNTER — Ambulatory Visit (INDEPENDENT_AMBULATORY_CARE_PROVIDER_SITE_OTHER): Payer: Medicare Other | Admitting: Family Medicine

## 2019-10-06 ENCOUNTER — Telehealth: Payer: Self-pay | Admitting: Family Medicine

## 2019-10-06 VITALS — BP 138/86 | HR 82 | Temp 96.6°F | Resp 16 | Ht 65.0 in | Wt 191.6 lb

## 2019-10-06 DIAGNOSIS — R7309 Other abnormal glucose: Secondary | ICD-10-CM | POA: Diagnosis not present

## 2019-10-06 DIAGNOSIS — M159 Polyosteoarthritis, unspecified: Secondary | ICD-10-CM

## 2019-10-06 DIAGNOSIS — E782 Mixed hyperlipidemia: Secondary | ICD-10-CM

## 2019-10-06 DIAGNOSIS — N952 Postmenopausal atrophic vaginitis: Secondary | ICD-10-CM

## 2019-10-06 DIAGNOSIS — B370 Candidal stomatitis: Secondary | ICD-10-CM

## 2019-10-06 DIAGNOSIS — I10 Essential (primary) hypertension: Secondary | ICD-10-CM

## 2019-10-06 DIAGNOSIS — G894 Chronic pain syndrome: Secondary | ICD-10-CM

## 2019-10-06 DIAGNOSIS — E039 Hypothyroidism, unspecified: Secondary | ICD-10-CM

## 2019-10-06 DIAGNOSIS — M8949 Other hypertrophic osteoarthropathy, multiple sites: Secondary | ICD-10-CM

## 2019-10-06 DIAGNOSIS — F5104 Psychophysiologic insomnia: Secondary | ICD-10-CM

## 2019-10-06 DIAGNOSIS — M150 Primary generalized (osteo)arthritis: Secondary | ICD-10-CM

## 2019-10-06 DIAGNOSIS — F3341 Major depressive disorder, recurrent, in partial remission: Secondary | ICD-10-CM

## 2019-10-06 DIAGNOSIS — M4722 Other spondylosis with radiculopathy, cervical region: Secondary | ICD-10-CM

## 2019-10-06 DIAGNOSIS — J432 Centrilobular emphysema: Secondary | ICD-10-CM

## 2019-10-06 DIAGNOSIS — J302 Other seasonal allergic rhinitis: Secondary | ICD-10-CM

## 2019-10-06 MED ORDER — PREMARIN 0.625 MG/GM VA CREA: 1.0000 | TOPICAL_CREAM | Freq: Every day | VAGINAL | 3 refills | Status: DC

## 2019-10-06 MED ORDER — PREMARIN 0.625 MG/GM VA CREA: 0.5000 | TOPICAL_CREAM | VAGINAL | 3 refills | Status: DC

## 2019-10-06 MED ORDER — MELOXICAM 15 MG PO TABS: 15.0000 mg | ORAL_TABLET | Freq: Every day | ORAL | 1 refills | Status: DC | PRN

## 2019-10-06 MED ORDER — AMLODIPINE BESYLATE 10 MG PO TABS: 10.0000 mg | ORAL_TABLET | Freq: Every day | ORAL | 1 refills | Status: DC

## 2019-10-06 MED ORDER — MONTELUKAST SODIUM 10 MG PO TABS: 10.0000 mg | ORAL_TABLET | Freq: Every day | ORAL | 1 refills | Status: DC

## 2019-10-06 MED ORDER — QUETIAPINE FUMARATE 100 MG PO TABS: 100.0000 mg | ORAL_TABLET | Freq: Every day | ORAL | 1 refills | Status: DC

## 2019-10-06 MED ORDER — GABAPENTIN 400 MG PO CAPS: 400.0000 mg | ORAL_CAPSULE | Freq: Four times a day (QID) | ORAL | 1 refills | Status: DC

## 2019-10-06 MED ORDER — ATORVASTATIN CALCIUM 40 MG PO TABS: 40.0000 mg | ORAL_TABLET | Freq: Every day | ORAL | 1 refills | Status: DC

## 2019-10-06 MED ORDER — ALBUTEROL SULFATE 108 (90 BASE) MCG/ACT IN AEPB: 1.0000 | INHALATION_SPRAY | RESPIRATORY_TRACT | 3 refills | Status: DC | PRN

## 2019-10-06 MED ORDER — LEVOTHYROXINE SODIUM 200 MCG PO TABS: 200.0000 ug | ORAL_TABLET | Freq: Every day | ORAL | 1 refills | Status: DC

## 2019-10-06 MED ORDER — FLUCONAZOLE 150 MG PO TABS: ORAL_TABLET | ORAL | 0 refills | Status: DC

## 2019-10-06 MED ORDER — BREZTRI AEROSPHERE 160-9-4.8 MCG/ACT IN AERO: 2.0000 | INHALATION_SPRAY | Freq: Two times a day (BID) | RESPIRATORY_TRACT | 2 refills | Status: DC

## 2019-10-06 NOTE — Assessment & Plan Note (Signed)
Poorly controlled chronic problem Active smoker, tobacco Has had suboptimal response to prior treatment Was on maintenance Symbicort, failed Spiriva Increased use albuterol rescue 2-3 times a day  Plan Discussed maintenance vs rescue therapy DC Symbicort. Start sample Breztri triple therapy 2 puff BID for 7 days then also sent rx to pharmacy. Re order Albuterol rescue (generic of proair should be covered, they did not cover ventolin) Encouraged quit smoking Future consider imaging CXR / LDCT for lung cancer screen and evaluation May refer to Pulmonology if not improving, may benefit from PFTs

## 2019-10-06 NOTE — Assessment & Plan Note (Signed)
Chronic problem Multiple joints with pain On Meloxicam 7.'5mg'$  BID, advised switch to '15mg'$  daily PRN, caution overuse NSAID Use Tylenol PRN

## 2019-10-06 NOTE — Assessment & Plan Note (Signed)
Mildly elevated initial BP, repeat manual check improved. - Home BP readings reviewed  No known complications   Plan:  1. Was on Amlodipine 10mg  BID dosing from prior PCP, advised will change back to standard recommended max dose Amlodipine 10mg  daily, in future can review med and consider add 2nd agent if indicated 2. Encourage improved lifestyle - low sodium diet, regular exercise 3. Monitor BP outside office, bring readings to next visit, if persistently >140/90 or new symptoms notify office sooner

## 2019-10-06 NOTE — Telephone Encounter (Signed)
Call received from pharmacy tech Anderson [place] She has question about a medication fill that I was unable to answer. Call transferred to office.

## 2019-10-06 NOTE — Progress Notes (Signed)
Subjective:    Patient ID: Audrey Campbell, female    DOB: 1955-12-23, 64 y.o.   MRN: [id number]  Audrey Campbell is a 64 y.o. female presenting on 10/06/2019 for Hypertension   HPI   CHRONIC HTN: Reports no recent BP readings, had been improved Current Meds - Amlodipine 10mg  BID no other med   Reports good compliance, took meds today. Tolerating well, w/o complaints. Denies CP, dyspnea, HA, edema, dizziness / lightheadedness   Osteoarthritis / Multiple sites, low back and R knee Uses topical voltaren gel PRN Followed by [facility] in [place], on Hydrocodone 10/325 and Clonazepam PRN refill Gabapentin 400 QID  Centrilobular Emphysema Chronic problem. Smoker.  On maintenance therapy - She is using Symbicort daily seems ineffective now. Using rescue inhaler too often now up to 3 x daily, no longer on Spiriva - also has nebulizer if needed  Hypothyroidism Doing well, due for lab and needs re order On Levothyroxine 23mcg  Obesity BMI >33  Opioid Induced Constipation Chronic problem Failed or unable to take/get approved Amitiza and Linzess Has not used miralax regularly yet  Recurrent Depression, Chronic / Anxiety / Insomnia Previously managed by prior PCP. Describes anxiety and panic history. Now has insomnia - Takes Seroquel nightly with improvement - Takes Clonazepam regularly nightly to help sleep, or PRN acute panic, rx from Pain Clinic.  HIstory of Yeast Infection recurrent / Thrush She has been on Fluconazole PRN some relief Uses inhalers can trigger oral thrush Has pain with intercourse and burning, admits vaginal atrophy. Not using any treatment  Health Maintenance: Due for Cologuard has not completed yet.  Depression screen [facility] 2/9 10/06/2019 07/09/2019  Decreased Interest 0 1  Down, Depressed, Hopeless 0 0  PHQ - 2 Score 0 1  Altered sleeping - 2  Tired, decreased energy - 1  Change in appetite - 1  Feeling bad or failure about yourself  -  0  Trouble concentrating - 0  Moving slowly or fidgety/restless - 0  Suicidal thoughts - 0  PHQ-9 Score - 5  Difficult doing work/chores - Somewhat difficult    Social History   Tobacco Use  . Smoking status: Current Every Day Smoker    Packs/day: 0.50    Types: Cigarettes  . Smokeless tobacco: Current User  Substance Use Topics  . Alcohol use: No  . Drug use: No    Review of Systems Per HPI unless specifically indicated above     Objective:    BP 138/86 (BP Location: Left Arm, Cuff Size: Normal)   Pulse 82   Temp (!) 96.6 F (35.9 C) (Temporal)   Resp 16   Ht 5\' 5"  (1.651 m)   Wt 191 lb 9.6 oz (86.9 kg)   SpO2 93%   BMI 31.88 kg/m   Wt Readings from Last 3 Encounters:  10/06/19 191 lb 9.6 oz (86.9 kg)  07/09/19 202 lb (91.6 kg)  12/12/17 205 lb 3.2 oz (93.1 kg)    Physical Exam Vitals and nursing note reviewed.  Constitutional:      General: She is not in acute distress.    Appearance: She is well-developed. She is not diaphoretic.     Comments: Well-appearing, comfortable, cooperative  HENT:     Head: Normocephalic and atraumatic.  Eyes:     General:        Right eye: No discharge.        Left eye: No discharge.     Conjunctiva/sclera: Conjunctivae normal.  Neck:     Thyroid: No thyromegaly.  Cardiovascular:     Rate and Rhythm: Normal rate and regular rhythm.     Heart sounds: Normal heart sounds. No murmur.  Pulmonary:     Effort: Pulmonary effort is normal. No respiratory distress.     Breath sounds: No wheezing or rales.     Comments: Mild reduced air movement diffuse seems slightly improved with some end expiratory high pitched wheezing Musculoskeletal:        General: Normal range of motion.     Cervical back: Normal range of motion and neck supple.  Lymphadenopathy:     Cervical: No cervical adenopathy.  Skin:    General: Skin is warm and dry.     Findings: No erythema or rash.  Neurological:     Mental Status: She is alert and oriented  to person, place, and time.  Psychiatric:        Behavior: Behavior normal.     Comments: Well groomed, good eye contact, normal speech and thoughts       Results for orders placed or performed in visit on 07/15/19  HM HEPATITIS C SCREENING LAB  Result Value Ref Range   HM Hepatitis Screen Negative-Validated       Assessment & Plan:   Problem List Items Addressed This Visit    Psychophysiological insomnia    Controlled on Quetiapine 100mg  nightly      Relevant Medications   QUEtiapine (SEROQUEL) 100 MG tablet   Primary osteoarthritis involving multiple joints    Chronic problem Multiple joints with pain On Meloxicam 7.5mg  BID, advised switch to 15mg  daily PRN, caution overuse NSAID Use Tylenol PRN      Relevant Medications   meloxicam (MOBIC) 15 MG tablet   gabapentin (NEURONTIN) 400 MG capsule   Other Relevant Orders   CBC with Differential/Platelet   Osteoarthritis of spine with radiculopathy, cervical region   Relevant Medications   meloxicam (MOBIC) 15 MG tablet   gabapentin (NEURONTIN) 400 MG capsule   QUEtiapine (SEROQUEL) 100 MG tablet   Mixed hyperlipidemia    Previously controlled Due for lipid panel Continue refill Atorvastatin 40mg  daily      Relevant Medications   amLODipine (NORVASC) 10 MG tablet   atorvastatin (LIPITOR) 40 MG tablet   Other Relevant Orders   COMPLETE METABOLIC PANEL WITH GFR   Lipid panel   Major depressive disorder, recurrent, in partial remission (HCC)    Currently in partial remission Doing well on med management Previously per PCP Continues on Quetiapine 100mg  nightly for mood and insomnia - will re order On Clonazepam per Pain Management      Hypothyroidism    Stable Previously controlled Refill Levothyroxine 236mcg daily Lab ordered TSH      Relevant Medications   levothyroxine (SYNTHROID) 200 MCG tablet   Other Relevant Orders   Lipid panel   TSH   Essential hypertension    Mildly elevated initial BP,  repeat manual check improved. - Home BP readings reviewed  No known complications   Plan:  1. Was on Amlodipine 10mg  BID dosing from prior PCP, advised will change back to standard recommended max dose Amlodipine 10mg  daily, in future can review med and consider add 2nd agent if indicated 2. Encourage improved lifestyle - low sodium diet, regular exercise 3. Monitor BP outside office, bring readings to next visit, if persistently >140/90 or new symptoms notify office sooner      Relevant Medications   amLODipine (NORVASC) 10 MG  tablet   atorvastatin (LIPITOR) 40 MG tablet   Other Relevant Orders   CBC with Differential/Platelet   COMPLETE METABOLIC PANEL WITH GFR   Chronic pain syndrome    Continue with her current pain clinic - [facility] [place] On Hydrocodone and Clonazepam Has secondary Opioid Induced Constipation      Relevant Medications   meloxicam (MOBIC) 15 MG tablet   gabapentin (NEURONTIN) 400 MG capsule   Centrilobular emphysema (HCC) - Primary    Poorly controlled chronic problem Active smoker, tobacco Has had suboptimal response to prior treatment Was on maintenance Symbicort, failed Spiriva Increased use albuterol rescue 2-3 times a day  Plan Discussed maintenance vs rescue therapy DC Symbicort. Start sample Breztri triple therapy 2 puff BID for 7 days then also sent rx to pharmacy. Re order Albuterol rescue (generic of proair should be covered, they did not cover ventolin) Encouraged quit smoking Future consider imaging CXR / LDCT for lung cancer screen and evaluation May refer to Pulmonology if not improving, may benefit from PFTs      Relevant Medications   Albuterol Sulfate (PROAIR RESPICLICK) 818 (90 Base) MCG/ACT AEPB   BREZTRI AEROSPHERE 160-9-4.8 MCG/ACT AERO   montelukast (SINGULAIR) 10 MG tablet    Other Visit Diagnoses    Seasonal allergies       Relevant Medications   montelukast (SINGULAIR) 10 MG tablet   Vaginitis, atrophic        Relevant Medications   conjugated estrogens (PREMARIN) vaginal cream (Start on 10/07/2019)   Oral thrush       Relevant Medications   fluconazole (DIFLUCAN) 150 MG tablet   Abnormal glucose       Relevant Orders   Hemoglobin A1c      #Vaginal Burning/Atrophy / Yeast Repeat course of Diflucan as prescribed - can wash mouth with water after inhaler use. Trial on topical premarin estrogens intermittent dosing 2 x weekly for now. Future may f/u with GYN if indicated   Re order all meds 90 day supply to pharmacy.  Labs ordered today. Fasting. Follow up results.  Meds ordered this encounter  Medications  . Albuterol Sulfate (PROAIR RESPICLICK) 563 (90 Base) MCG/ACT AEPB    Sig: Inhale 1-2 puffs into the lungs every 4 (four) hours as needed.    Dispense:  1 each    Refill:  3  . BREZTRI AEROSPHERE 160-9-4.8 MCG/ACT AERO    Sig: Inhale 2 puffs into the lungs in the morning and at bedtime.    Dispense:  10.7 g    Refill:  2  . meloxicam (MOBIC) 15 MG tablet    Sig: Take 1 tablet (15 mg total) by mouth daily as needed for pain.    Dispense:  90 tablet    Refill:  1  . levothyroxine (SYNTHROID) 200 MCG tablet    Sig: Take 1 tablet (200 mcg total) by mouth daily before breakfast.    Dispense:  90 tablet    Refill:  1  . gabapentin (NEURONTIN) 400 MG capsule    Sig: Take 1 capsule (400 mg total) by mouth 4 (four) times daily.    Dispense:  360 capsule    Refill:  1  . montelukast (SINGULAIR) 10 MG tablet    Sig: Take 1 tablet (10 mg total) by mouth at bedtime.    Dispense:  90 tablet    Refill:  1  . amLODipine (NORVASC) 10 MG tablet    Sig: Take 1 tablet (10 mg total) by  mouth daily.    Dispense:  90 tablet    Refill:  1  . QUEtiapine (SEROQUEL) 100 MG tablet    Sig: Take 1 tablet (100 mg total) by mouth at bedtime.    Dispense:  90 tablet    Refill:  1  . atorvastatin (LIPITOR) 40 MG tablet    Sig: Take 1 tablet (40 mg total) by mouth daily.    Dispense:  90 tablet     Refill:  1  . DISCONTD: conjugated estrogens (PREMARIN) vaginal cream    Sig: Place 1 Applicatorful vaginally daily.    Dispense:  42.5 g    Refill:  3  . fluconazole (DIFLUCAN) 150 MG tablet    Sig: Take one tablet by mouth on Day 1. Repeat dose 2nd tablet on Day 3.    Dispense:  2 tablet    Refill:  0  . conjugated estrogens (PREMARIN) vaginal cream    Sig: Place 0.5 Applicatorfuls vaginally 2 (two) times a week.    Dispense:  30 g    Refill:  3      Follow up plan: Return in about 6 months (around 04/06/2020) for 6 month follow-up HTN, COPD med refills.   Nobie Putnam, [facility] 10/06/2019, 9:23 AM

## 2019-10-06 NOTE — Assessment & Plan Note (Signed)
Continue with her current pain clinic - [facility] [place] On Hydrocodone and Clonazepam Has secondary Opioid Induced Constipation

## 2019-10-06 NOTE — Assessment & Plan Note (Signed)
Previously controlled Due for lipid panel Continue refill Atorvastatin 40mg  daily

## 2019-10-06 NOTE — Assessment & Plan Note (Addendum)
Currently in partial remission Doing well on med management Previously per PCP Continues on Quetiapine 100mg  nightly for mood and insomnia - will re order On Clonazepam per Pain Management

## 2019-10-06 NOTE — Assessment & Plan Note (Signed)
Stable Previously controlled Refill Levothyroxine 262mcg daily Lab ordered TSH

## 2019-10-06 NOTE — Patient Instructions (Addendum)
Thank you for coming to the office today.  Stop Symbicort.  Start new inhaler Breztri 2 puffs twice a day.  Labs today  90 day ordered  Try topical estrogen cream for vaginal burning  Try yeast pill if not improved  New rescue inhaler try to limit using it as often.  For Constipation (less frequent bowel movement that can be hard dry or involve straining).  Recommend trying OTC Miralax 17g = 1 capful in large glass water once daily for now, try several days to see if working, goal is soft stool or BM 1-2 times daily, if too loose then reduce dose or try every other day. If not effective may need to increase it to 2 doses at once in AM or may do 1 in morning and 1 in afternoon/evening  - This medicine is very safe and can be used often without any problem and will not make you dehydrated. It is good for use on AS NEEDED BASIS or even MAINTENANCE therapy for longer term for several days to weeks at a time to help regulate bowel movements  Other more natural remedies or preventative treatment: - Increase hydration with water - Increase fiber in diet (high fiber foods = vegetables, leafy greens, oats/grains) - May take OTC Fiber supplement (metamucil powder or pill/gummy) - May try OTC Probiotic   Please schedule a Follow-up Appointment to: Return in about 6 months (around 04/06/2020) for 6 month follow-up HTN, COPD med refills.  If you have any other questions or concerns, please feel free to call the office or send a message through [employer]. You may also schedule an earlier appointment if necessary.  Additionally, you may be receiving a survey about your experience at our office within a few days to 1 week by e-mail or mail. We value your feedback.  Nobie Putnam, DO [place]

## 2019-10-06 NOTE — Assessment & Plan Note (Signed)
Controlled on Quetiapine 100mg nightly 

## 2019-10-07 ENCOUNTER — Other Ambulatory Visit: Payer: Self-pay | Admitting: Family Medicine

## 2019-10-07 DIAGNOSIS — E559 Vitamin D deficiency, unspecified: Secondary | ICD-10-CM

## 2019-10-07 LAB — HEMOGLOBIN A1C
Hgb A1c MFr Bld: 5.6 % of total Hgb (ref <5.7)
Mean Plasma Glucose: 114 (calc)
eAG (mmol/L): 6.3 (calc)

## 2019-10-07 LAB — COMPLETE METABOLIC PANEL WITH GFR
AG Ratio: 1.6 (calc) (ref 1.0–2.5)
ALT: 10 U/L (ref 6–29)
AST: 14 U/L (ref 10–35)
Albumin: 4.3 g/dL (ref 3.6–5.1)
Alkaline phosphatase (APISO): 103 U/L (ref 37–153)
BUN: 8 mg/dL (ref 7–25)
CO2: 31 mmol/L (ref 20–32)
Calcium: 9.6 mg/dL (ref 8.6–10.4)
Chloride: 98 mmol/L (ref 98–110)
Creat: 0.95 mg/dL (ref 0.50–0.99)
GFR, Est African American: 73 mL/min/{1.73_m2} (ref >=60)
GFR, Est Non African American: 63 mL/min/{1.73_m2} (ref >=60)
Globulin: 2.7 g/dL (calc) (ref 1.9–3.7)
Glucose, Bld: 108 mg/dL — ABNORMAL HIGH (ref 65–99)
Potassium: 4.6 mmol/L (ref 3.5–5.3)
Sodium: 136 mmol/L (ref 135–146)
Total Bilirubin: 0.4 mg/dL (ref 0.2–1.2)
Total Protein: 7 g/dL (ref 6.1–8.1)

## 2019-10-07 LAB — TSH: TSH: 4.01 mIU/L (ref 0.40–4.50)

## 2019-10-07 LAB — LIPID PANEL
Cholesterol: 219 mg/dL — ABNORMAL HIGH (ref <200)
HDL: 40 mg/dL — ABNORMAL LOW (ref >=50)
LDL Cholesterol (Calc): 147 mg/dL (calc) — ABNORMAL HIGH
Non-HDL Cholesterol (Calc): 179 mg/dL (calc) — ABNORMAL HIGH (ref <130)
Total CHOL/HDL Ratio: 5.5 (calc) — ABNORMAL HIGH (ref <5.0)
Triglycerides: 188 mg/dL — ABNORMAL HIGH (ref <150)

## 2019-10-07 LAB — CBC WITH DIFFERENTIAL/PLATELET
Absolute Monocytes: 366 cells/uL (ref 200–950)
Basophils Absolute: 69 cells/uL (ref 0–200)
Basophils Relative: 1 %
Eosinophils Absolute: 179 cells/uL (ref 15–500)
Eosinophils Relative: 2.6 %
HCT: 43.1 % (ref 35.0–45.0)
Hemoglobin: 13.9 g/dL (ref 11.7–15.5)
Lymphs Abs: 2781 cells/uL (ref 850–3900)
MCH: 27.8 pg (ref 27.0–33.0)
MCHC: 32.3 g/dL (ref 32.0–36.0)
MCV: 86.2 fL (ref 80.0–100.0)
MPV: 10.1 fL (ref 7.5–12.5)
Monocytes Relative: 5.3 %
Neutro Abs: 3505 cells/uL (ref 1500–7800)
Neutrophils Relative %: 50.8 %
Platelets: 226 10*3/uL (ref 140–400)
RBC: 5 10*6/uL (ref 3.80–5.10)
RDW: 14.7 % (ref 11.0–15.0)
Total Lymphocyte: 40.3 %
WBC: 6.9 10*3/uL (ref 3.8–10.8)

## 2019-10-08 ENCOUNTER — Telehealth: Payer: Self-pay | Admitting: Family Medicine

## 2019-10-08 DIAGNOSIS — J432 Centrilobular emphysema: Secondary | ICD-10-CM

## 2019-10-08 MED ORDER — PROAIR HFA 108 (90 BASE) MCG/ACT IN AERS: 1.0000 | INHALATION_SPRAY | Freq: Four times a day (QID) | RESPIRATORY_TRACT | 2 refills | Status: DC | PRN

## 2019-10-08 NOTE — Telephone Encounter (Signed)
Spoke to the pharmacy Albuterol Sulfate (PROAIR RESPICLICK) 670 (90 Base) MCG/ACT AEPB is not covered by her insurance but reg PROAIR  without RESPICLICK is covered.

## 2019-10-18 DIAGNOSIS — J449 Chronic obstructive pulmonary disease, unspecified: Secondary | ICD-10-CM | POA: Diagnosis not present

## 2019-11-09 ENCOUNTER — Other Ambulatory Visit: Payer: Self-pay

## 2019-11-09 ENCOUNTER — Ambulatory Visit (INDEPENDENT_AMBULATORY_CARE_PROVIDER_SITE_OTHER): Payer: Medicare Other | Admitting: Family Medicine

## 2019-11-09 ENCOUNTER — Encounter: Payer: Self-pay | Admitting: Family Medicine

## 2019-11-09 VITALS — BP 120/70 | HR 83 | Temp 97.1°F | Resp 16 | Ht 65.0 in | Wt 189.0 lb

## 2019-11-09 DIAGNOSIS — K219 Gastro-esophageal reflux disease without esophagitis: Secondary | ICD-10-CM

## 2019-11-09 DIAGNOSIS — M159 Polyosteoarthritis, unspecified: Secondary | ICD-10-CM

## 2019-11-09 DIAGNOSIS — Z1211 Encounter for screening for malignant neoplasm of colon: Secondary | ICD-10-CM

## 2019-11-09 DIAGNOSIS — G894 Chronic pain syndrome: Secondary | ICD-10-CM

## 2019-11-09 DIAGNOSIS — G8929 Other chronic pain: Secondary | ICD-10-CM

## 2019-11-09 DIAGNOSIS — I10 Essential (primary) hypertension: Secondary | ICD-10-CM

## 2019-11-09 DIAGNOSIS — M8949 Other hypertrophic osteoarthropathy, multiple sites: Secondary | ICD-10-CM

## 2019-11-09 DIAGNOSIS — R6881 Early satiety: Secondary | ICD-10-CM

## 2019-11-09 DIAGNOSIS — F17210 Nicotine dependence, cigarettes, uncomplicated: Secondary | ICD-10-CM | POA: Diagnosis not present

## 2019-11-09 DIAGNOSIS — F3341 Major depressive disorder, recurrent, in partial remission: Secondary | ICD-10-CM | POA: Diagnosis not present

## 2019-11-09 DIAGNOSIS — R110 Nausea: Secondary | ICD-10-CM

## 2019-11-09 DIAGNOSIS — M25561 Pain in right knee: Secondary | ICD-10-CM

## 2019-11-09 DIAGNOSIS — M23206 Derangement of unspecified meniscus due to old tear or injury, right knee: Secondary | ICD-10-CM

## 2019-11-09 DIAGNOSIS — J432 Centrilobular emphysema: Secondary | ICD-10-CM

## 2019-11-09 DIAGNOSIS — F17200 Nicotine dependence, unspecified, uncomplicated: Secondary | ICD-10-CM

## 2019-11-09 MED ORDER — VARENICLINE TARTRATE 0.5 MG X 11 & 1 MG X 42 PO MISC: ORAL | 0 refills | Status: DC

## 2019-11-09 NOTE — Patient Instructions (Addendum)
Thank you for coming to the office today.  [facility]) [address], [place] [address] Phone: [phone]  Referral in to GI  Stay tuned for apt.  Right knee meniscus likely the cause with degenerative arthritis, we can do x-ray if you want, we can do a steroid injection.  Diclofenac / Voltaren - keep using it!   If you have any significant chest pain that does not go away within 30 minutes, is accompanied by nausea, sweating, shortness of breath, or made worse by activity, this may be evidence of a heart attack, especially if symptoms worsening instead of improving, please call 911 or go directly to the emergency room immediately for evaluation.    After 3 months - can return for Lexapro or other mood medicine after chantix.   Varenicline - Chantix Dosing: Duration Dosage  Initial 3 days  0.5mg  daily  Days 4-7 0.5mg  twice daily  Day 8 through the end of therapy  1mg  twice daily   Prescribing Instructions: Marland Kitchen Use of "Chantix - Starting Month Pak" and "Chantix - Continuing Month Pak" provide easy to use blister packaging.  . Black Box Warning: Mood Change -"Serious neuropsychiatric events have been reported." Stop Drug and contact health care provider if patient. "agitation, hostility, depressed mood or changes in thinking or behavior that are not typical for the patient are observed, or if the patient develops suicidal ideation / behavior." Document potential mood change.  . Patients can continue to smoke while initiating varenicline. . A "target quit date" should be set on calendar approximately Day 8 to max Day 30 (of the treatment) - PREFERRED QUIT DATE - 1 to 2 weeks from start of medicine. . Advise to take this medication WITH food.  Minimizing the nausea (typically mild and transient).  If nausea occurs, consider dose reduction or temporary cessation of treatment.   . The treatment should be continued for 12 weeks.  An  additional 12 weeks of therapy can be used at the prescribers discretion.   . Chantix can change the way people react to alcohol (decreased tolerance, increased drunkenness, unusual or aggressive behavior, memory loss) . Seizures occurred in patients that had no history of seizures and in patients that had a seizure disorder that had been well controlled  . Discuss Cardiovascular Safety     Please schedule a Follow-up Appointment to: Return in about 3 months (around 02/09/2020) for 3 month smoking, depression med adjust.  If you have any other questions or concerns, please feel free to call the office or send a message through [place]. You may also schedule an earlier appointment if necessary.  Additionally, you may be receiving a survey about your experience at our office within a few days to 1 week by e-mail or mail. We value your feedback.  Nobie Putnam, DO [place]

## 2019-11-09 NOTE — Progress Notes (Signed)
Subjective:    Patient ID: MAYSIE PARKHILL, female    DOB: 02-Jul-1955, 64 y.o.   MRN: [id number]  MAKESHIA SEAT is a 64 y.o. female presenting on 11/09/2019 for Abdominal Pain (nausea after eating fresh squash onset 4 days ago --chest and jaw was hurting while trying to throw up)  Accompanied by friend, Tacey Heap.  HPI  Acute Upper Abdominal Pain / Chest Wall pain / Nausea without vomiting Early Satiety GERD vs Gastritis Reports acute episode, 4 days ago. After eating fried squash, she had dry heaving, she had nausea, sweating, and episode of jaw pain chest pain and nausea without vomiting. frequent dry heaving but no vomiting - She has had some mid abdominal episodic, some symptoms with early satiety after eating, nausea at times but no vomiting still - has not had cardiac work up - She did not seek care immediately  Right Knee torn meniscus, arthritis Prior imaging and injection per ortho years ago Has had pain and swelling R knee, radiating down leg Some assoc leg swelling Osteoarthritis / Multiple sites, low back and R knee Uses topical voltaren gel PRN Followed by [facility] in [place], on Hydrocodone 10/325 and Clonazepam PRN  Centrilobular Emphysema Chronic problem. Smoker.  On maintenance therapy - She is using Symbicort daily seems ineffective now. Using rescue often She is ready to quit smoking, with chantix again, quit before for 7 months  Recurrent Depression, Chronic / Anxiety / Insomnia Previously managed by prior PCP. Describes anxiety and panic history. Now has insomnia Some worse mood Past on Wellbutrin, Sertraline - Takes Seroquel nightlywith improvement - Takes Clonazepam regularly nightly to help sleep, or PRN acute panic, rx from Pain Clinic.  Health Maintenance: Decline Cologuard, she says cannot complete this test - she prefers Colonoscopy.  Depression screen [facility] 2/9 11/09/2019 10/06/2019 07/09/2019  Decreased Interest 2 0 1  Down,  Depressed, Hopeless 1 0 0  PHQ - 2 Score 3 0 1  Altered sleeping 2 - 2  Tired, decreased energy 3 - 1  Change in appetite 1 - 1  Feeling bad or failure about yourself  1 - 0  Trouble concentrating 1 - 0  Moving slowly or fidgety/restless 1 - 0  Suicidal thoughts 0 - 0  PHQ-9 Score 12 - 5  Difficult doing work/chores Somewhat difficult - Somewhat difficult   GAD 7 : Generalized Anxiety Score 11/09/2019 07/09/2019  Nervous, Anxious, on Edge 2 2  Control/stop worrying 3 3  Worry too much - different things 3 2  Trouble relaxing 2 2  Restless 2 1  Easily annoyed or irritable 3 3  Afraid - awful might happen 2 0  Total GAD 7 Score 17 13  Anxiety Difficulty Very difficult Very difficult     Social History   Tobacco Use  . Smoking status: Current Every Day Smoker    Packs/day: 0.50    Types: Cigarettes  . Smokeless tobacco: Current User  Substance Use Topics  . Alcohol use: No  . Drug use: No    Review of Systems Per HPI unless specifically indicated above     Objective:    BP 120/70   Pulse 83   Temp (!) 97.1 F (36.2 C) (Temporal)   Resp 16   Ht 5\' 5"  (1.651 m)   Wt 189 lb (85.7 kg)   SpO2 96%   BMI 31.45 kg/m   Wt Readings from Last 3 Encounters:  11/09/19 189 lb (85.7 kg)  10/06/19 191  lb 9.6 oz (86.9 kg)  07/09/19 202 lb (91.6 kg)    Physical Exam Vitals and nursing note reviewed.  Constitutional:      General: She is not in acute distress.    Appearance: She is well-developed. She is obese. She is not diaphoretic.     Comments: Well-appearing, comfortable, cooperative  HENT:     Head: Normocephalic and atraumatic.  Eyes:     General:        Right eye: No discharge.        Left eye: No discharge.     Conjunctiva/sclera: Conjunctivae normal.  Neck:     Thyroid: No thyromegaly.  Cardiovascular:     Rate and Rhythm: Normal rate and regular rhythm.     Heart sounds: Normal heart sounds. No murmur heard.   Pulmonary:     Effort: Pulmonary effort is  normal. No respiratory distress.     Breath sounds: Normal breath sounds. No wheezing or rales.  Chest:     Chest wall: No tenderness.  Abdominal:     General: There is no distension.     Palpations: Abdomen is soft.     Tenderness: There is no abdominal tenderness.  Musculoskeletal:        General: Normal range of motion.     Cervical back: Normal range of motion and neck supple.     Right lower leg: Edema (+1 trace, with varicose veins) present.     Left lower leg: Edema (+1 trace, with varicose veins) present.     Comments: Right knee bulky appearance, crepitus  Lymphadenopathy:     Cervical: No cervical adenopathy.  Skin:    General: Skin is warm and dry.     Findings: No erythema or rash.  Neurological:     Mental Status: She is alert and oriented to person, place, and time.  Psychiatric:        Behavior: Behavior normal.     Comments: Well groomed, good eye contact, normal speech and thoughts       Results for orders placed or performed in visit on 10/06/19  Hemoglobin A1c  Result Value Ref Range   Hgb A1c MFr Bld 5.6 <5.7 % of total Hgb   Mean Plasma Glucose 114 (calc)   eAG (mmol/L) 6.3 (calc)  CBC with Differential/Platelet  Result Value Ref Range   WBC 6.9 3.8 - 10.8 Thousand/uL   RBC 5.00 3.80 - 5.10 Million/uL   Hemoglobin 13.9 11.7 - 15.5 g/dL   HCT 43.1 35 - 45 %   MCV 86.2 80.0 - 100.0 fL   MCH 27.8 27.0 - 33.0 pg   MCHC 32.3 32.0 - 36.0 g/dL   RDW 14.7 11.0 - 15.0 %   Platelets 226 140 - 400 Thousand/uL   MPV 10.1 7.5 - 12.5 fL   Neutro Abs 3,505 1,500 - 7,800 cells/uL   Lymphs Abs 2,781 850 - 3,900 cells/uL   Absolute Monocytes 366 200 - 950 cells/uL   Eosinophils Absolute 179 15 - 500 cells/uL   Basophils Absolute 69 0 - 200 cells/uL   Neutrophils Relative % 50.8 %   Total Lymphocyte 40.3 %   Monocytes Relative 5.3 %   Eosinophils Relative 2.6 %   Basophils Relative 1.0 %  COMPLETE METABOLIC PANEL WITH GFR  Result Value Ref Range   Glucose,  Bld 108 (H) 65 - 99 mg/dL   BUN 8 7 - 25 mg/dL   Creat 0.95 0.50 - 0.99 mg/dL   GFR,  Est Non African American 63 > OR = 60 mL/min/1.87m2   GFR, Est African American 73 > OR = 60 mL/min/1.103m2   BUN/Creatinine Ratio NOT APPLICABLE 6 - 22 (calc)   Sodium 136 135 - 146 mmol/L   Potassium 4.6 3.5 - 5.3 mmol/L   Chloride 98 98 - 110 mmol/L   CO2 31 20 - 32 mmol/L   Calcium 9.6 8.6 - 10.4 mg/dL   Total Protein 7.0 6.1 - 8.1 g/dL   Albumin 4.3 3.6 - 5.1 g/dL   Globulin 2.7 1.9 - 3.7 g/dL (calc)   AG Ratio 1.6 1.0 - 2.5 (calc)   Total Bilirubin 0.4 0.2 - 1.2 mg/dL   Alkaline phosphatase (APISO) 103 37 - 153 U/L   AST 14 10 - 35 U/L   ALT 10 6 - 29 U/L  Lipid panel  Result Value Ref Range   Cholesterol 219 (H) <200 mg/dL   HDL 40 (L) > OR = 50 mg/dL   Triglycerides 188 (H) <150 mg/dL   LDL Cholesterol (Calc) 147 (H) mg/dL (calc)   Total CHOL/HDL Ratio 5.5 (H) <5.0 (calc)   Non-HDL Cholesterol (Calc) 179 (H) <130 mg/dL (calc)  TSH  Result Value Ref Range   TSH 4.01 0.40 - 4.50 mIU/L      Assessment & Plan:   Problem List Items Addressed This Visit    Smoker   Relevant Medications   varenicline (CHANTIX PAK) 0.5 MG X 11 & 1 MG X 42 tablet   Primary osteoarthritis involving multiple joints   Old tear of meniscus of right knee   Major depressive disorder, recurrent, in partial remission (HCC) - Primary   Essential hypertension   Chronic pain syndrome   Chronic pain of right knee   Centrilobular emphysema (HCC)   Relevant Medications   varenicline (CHANTIX PAK) 0.5 MG X 11 & 1 MG X 42 tablet    Other Visit Diagnoses    Cigarette nicotine dependence without complication       Relevant Medications   varenicline (CHANTIX PAK) 0.5 MG X 11 & 1 MG X 42 tablet   Gastroesophageal reflux disease, unspecified whether esophagitis present       Relevant Orders   Ambulatory referral to Gastroenterology   Early satiety       Relevant Orders   Ambulatory referral to Gastroenterology    Nausea       Relevant Orders   Ambulatory referral to Gastroenterology   Colon cancer screening       Relevant Orders   Ambulatory referral to Gastroenterology     #GERD / Early Satiety / Nausea Recent worsening problem with acute flare within 1 week ago with constellation of symptoms per HPI. Question at that time if triggered by food, could not rule out cardiac cause otherwise did not seek acute medical care. Has since resolved. However still has some fairly chronic early satiety and nausea / GERD like symptoms some related to eating.  Currently resolved, no further chest discomfort / sweating / nausea.  - Active smoker inc risk - Counseling on diet / lifestyle modification - Ultimately she is due for Colonoscopy screening, may warrant future EGD - Discussed may refer to Cardiology however - will pursue GI work up first. She is aware to go to hospital ED if new acute symptoms again similar or with chest pain or dyspnea acutely.   #OA/DJD Chronic Pain R Knee pain - meniscus injury - old problem Keep on Voltaren topical, advised if pain worse  can do steroid injection or refer to Ortho Check PDMP Followed by Pain Management  #Centrilobular Emphysema / COPD Quit smoking, Chantix.  #Major Depression, recurrent, partial remission / Anxiety Chronic problems Comorbid conditions, on pain management On Chronic BDZ from pain clinic On Seroquel for insomnia Prior history on SSRI sertraline, wellbutrin failed  Discussion on next option Lexapro vs SNRI. We will defer for now, quitting smoking on Chantix trial, will defer new SSRI for now. Return 3 months when quit and off chantix.   #Nicotine Dependence, Tobacco Abuse Previously successful on chantix, will start this new, and hold new SSRI  Plan: 1. Start Varenicline (Chantix) Day 1-3: 0.5mg  once daily WITH FOOD, Days 4-7: increase to 0.5mg  BID, then maintenance dose after 1 week: 1mg  BID for up to 12 weeks - Set quit date 1 week after  start of medication, alternatively if unable to quit, then set goal reduce by 50% or more by 4 weeks, then another 50% reduction in 4 more weeks, and lastly quit after final 4 weeks (total 12 week therapy) - Call us within 1 month for - Optional additional 8-12 weeks if needed for maintenance - Counseling on side effects primarily nausea (take with food), headaches, insomnia, vivid dreams, mood instability, seizure, rare risk of suicidal ideation, if any serious agitation or acute depression severe mood changes need to stop med  Discussion today >5 minutes (<10 minutes) specifically on counseling on risks of tobacco use, complications, treatment, smoking cessation.  Orders Placed This Encounter  Procedures  . Ambulatory referral to Gastroenterology    Referral Priority:   Routine    Referral Type:   Consultation    Referral Reason:   Specialty Services Required    Number of Visits Requested:   1      Meds ordered this encounter  Medications  . varenicline (CHANTIX PAK) 0.5 MG X 11 & 1 MG X 42 tablet    Sig: Take one 0.5 mg tab by mouth once daily for 3 days, increase to one 0.5 mg twice daily for 4 days, then increase to one 1 mg twice daily.    Dispense:  53 tablet    Refill:  0    Follow up plan: Return in about 3 months (around 02/09/2020) for 3 month smoking, depression med adjust.   Nobie Putnam, DO [facility] 11/09/2019, 4:50 PM

## 2019-11-17 DIAGNOSIS — J449 Chronic obstructive pulmonary disease, unspecified: Secondary | ICD-10-CM | POA: Diagnosis not present

## 2019-11-18 ENCOUNTER — Telehealth: Payer: Self-pay | Admitting: Family Medicine

## 2019-11-18 DIAGNOSIS — F17210 Nicotine dependence, cigarettes, uncomplicated: Secondary | ICD-10-CM

## 2019-11-18 DIAGNOSIS — J432 Centrilobular emphysema: Secondary | ICD-10-CM

## 2019-11-18 DIAGNOSIS — F17200 Nicotine dependence, unspecified, uncomplicated: Secondary | ICD-10-CM

## 2019-11-18 MED ORDER — NICOTINE 21 MG/24HR TD PT24: 21.0000 mg | MEDICATED_PATCH | Freq: Every day | TRANSDERMAL | 0 refills | Status: DC

## 2019-11-18 NOTE — Telephone Encounter (Signed)
If she was taking it with food that is only way to help reduce side effects. But if bothering her too much we can stop it.  Discontinued Chantix due to side effect.  She has already tried Wellbutrin in the past.  No other medicines for smoking.  I have sent rx Nicoderm 21mg  patch - use new patch daily (24 hr on patch)  No smoking on patches.  After 4 weeks, she can notify us for lower dose to 14 or 7mg .  Nobie Putnam, [facility] 11/18/2019, 1:51 PM

## 2019-11-18 NOTE — Telephone Encounter (Signed)
Pt called stating that the chantix is causing her to have stomach pain. She is requesting to have another medication sent in for her or to have the patches sent in. Please advise.     [facility] [address] [place], [place] - [place] [address]  Phone: [phone] Fax: [phone]  Hours: Not open 24 hours

## 2019-11-18 NOTE — Telephone Encounter (Signed)
Patient's friend on DPR was notified.

## 2019-11-24 DIAGNOSIS — G894 Chronic pain syndrome: Secondary | ICD-10-CM | POA: Diagnosis not present

## 2019-11-24 DIAGNOSIS — M545 Low back pain: Secondary | ICD-10-CM | POA: Diagnosis not present

## 2019-11-24 DIAGNOSIS — Z79891 Long term (current) use of opiate analgesic: Secondary | ICD-10-CM | POA: Diagnosis not present

## 2019-11-26 NOTE — Telephone Encounter (Signed)
Pt called saying she spoke with Vaughan Basta at [employer].  They told her they would help pay for her inhaler Breztri if the doctor would fax the rx to [phone] Pt ID #  [id number]  Patients CB#  [phone]

## 2019-11-29 MED ORDER — BREZTRI AEROSPHERE 160-9-4.8 MCG/ACT IN AERO: 2.0000 | INHALATION_SPRAY | Freq: Two times a day (BID) | RESPIRATORY_TRACT | 2 refills | Status: DC

## 2019-11-29 NOTE — Telephone Encounter (Signed)
Printed Breztri rx.  Can fax to the number provided as requested by patient.  [employer]  [phone]  Nobie Putnam, [facility] 11/29/2019, 12:43 PM

## 2019-11-29 NOTE — Telephone Encounter (Signed)
Faxed

## 2019-12-03 DIAGNOSIS — Z1211 Encounter for screening for malignant neoplasm of colon: Secondary | ICD-10-CM | POA: Diagnosis not present

## 2019-12-04 LAB — COLOGUARD: Cologuard: POSITIVE — AB

## 2019-12-10 LAB — COLOGUARD: COLOGUARD: POSITIVE — AB

## 2019-12-13 ENCOUNTER — Telehealth: Payer: Self-pay

## 2019-12-13 DIAGNOSIS — R195 Other fecal abnormalities: Secondary | ICD-10-CM

## 2019-12-13 NOTE — Telephone Encounter (Signed)
Copied from [facility] [id number]. Topic: General - Inquiry >> Dec 13, 2019  2:54 PM Scherrie Gerlach wrote: Reason for CRM: Di Kindle with [employer] calling to inform dr of positive colo guard result.  She is re faxing now. Cb [id number]  case [id number]  please provide NPI # for dr if you cb

## 2019-12-14 ENCOUNTER — Encounter: Payer: Self-pay | Admitting: Family Medicine

## 2019-12-14 NOTE — Telephone Encounter (Signed)
Called patient, spoke with Audrey Campbell. Below is a summary of our conversation  We received cologuard test result - it is POSITIVE. This is an abnormal result. And it means that we need more testing to determine if there is a problem with your colon.  Most of the time we get a positive Cologuard result, usually it is due to a Polyp that is either benign or possibly pre-cancer that is caught very early and is treatable before it ever turns into a problem or cancer. It is possible for it to have identified a colon cancer, however this is less likely.  The next step is a Colonoscopy procedure to look for the abnormality or polyp and remove and treat it. We would offer this by referring you to a GI specialist locally.  However, she is already scheduled with [facility] on 12/22/19 for new patient evaluation for GERD / Upper GI symptoms.  I advised her that I will forward this result note to her GI provider and she should also verbally let them know at that time, and they can help guide her with next steps. She will need a diagnostic colonoscopy and they may discuss options for when / how to obtain this with her next.  Nobie Putnam, [facility] 12/14/2019, 5:52 PM

## 2019-12-14 NOTE — Telephone Encounter (Signed)
Do you have a copy of this result? Let me know if you are able to locate it and I can call her within 1-2 days to discuss this positive result. Otherwise, if you can login and print a new copy of result that would work so we can abstract it. Thanks  Nobie Putnam, [facility] 12/14/2019, 9:34 AM

## 2019-12-18 DIAGNOSIS — J449 Chronic obstructive pulmonary disease, unspecified: Secondary | ICD-10-CM | POA: Diagnosis not present

## 2019-12-22 ENCOUNTER — Other Ambulatory Visit: Payer: Self-pay

## 2019-12-22 ENCOUNTER — Ambulatory Visit: Payer: Medicare Other | Admitting: Gastroenterology

## 2019-12-22 ENCOUNTER — Encounter: Payer: Self-pay | Admitting: Gastroenterology

## 2019-12-22 VITALS — BP 139/83 | HR 97 | Temp 98.1°F | Ht 64.5 in | Wt 188.2 lb

## 2019-12-22 DIAGNOSIS — R195 Other fecal abnormalities: Secondary | ICD-10-CM | POA: Diagnosis not present

## 2019-12-22 DIAGNOSIS — R1084 Generalized abdominal pain: Secondary | ICD-10-CM

## 2019-12-22 DIAGNOSIS — R12 Heartburn: Secondary | ICD-10-CM

## 2019-12-22 DIAGNOSIS — K5900 Constipation, unspecified: Secondary | ICD-10-CM | POA: Diagnosis not present

## 2019-12-22 DIAGNOSIS — K219 Gastro-esophageal reflux disease without esophagitis: Secondary | ICD-10-CM | POA: Diagnosis not present

## 2019-12-22 MED ORDER — NA SULFATE-K SULFATE-MG SULF 17.5-3.13-1.6 GM/177ML PO SOLN: ORAL | 0 refills | Status: DC

## 2019-12-22 NOTE — Patient Instructions (Addendum)
Please take Miralax 17 Grams daily.  For 2 days before procedure you will have to do the following: Clear liquid diet and take Suprep solution for two days.  At 5 PM two days before procedure take 2 bottles with ice cold water until it is all gone. Then at 5 PM the day before your procedure take 1 bottle with ice cold water until all gone and 5 hours before your procedure take 1 bottle until all gone.    Polyethylene Glycol powder What is this medicine? POLYETHYLENE GLYCOL 3350 (pol ee ETH i leen; GLYE col) powder is a laxative used to treat constipation. It increases the amount of water in the stool. Bowel movements become easier and more frequent. This medicine may be used for other purposes; ask your health care provider or pharmacist if you have questions. COMMON BRAND NAME(S): Sharlyn Bologna, GlycoLax, Healthylax, MiraLax, Smooth LAX, Vita Health What should I tell my health care provider before I take this medicine? They need to know if you have any of these conditions:  a history of blockage of the stomach or intestine  current abdomen distension or pain  difficulty swallowing  diverticulitis, ulcerative colitis, or other chronic bowel disease  phenylketonuria  an unusual or allergic reaction to polyethylene glycol, other medicines, dyes, or preservatives  pregnant or trying to get pregnant  breast-feeding How should I use this medicine? Take this medicine by mouth. The bottle has a measuring cap that is marked with a line. Pour the powder into the cap up to the marked line (the dose is about 1 heaping tablespoon). Add the powder in the cap to a full glass (4 to 8 ounces or 120 to 240 mL) of water, juice, soda, coffee or tea. Mix the powder well. Ensure that the powder is fully dissolved. Do not drink if there are any clumps. Drink the solution. Take exactly as directed. Do not take your medicine more often than directed. Talk to your pediatrician regarding the use of this  medicine in children. Special care may be needed. Overdosage: If you think you have taken too much of this medicine contact a poison control center or emergency room at once. NOTE: This medicine is only for you. Do not share this medicine with others. What if I miss a dose? If you miss a dose, take it as soon as you can. If it is almost time for your next dose, take only that dose. Do not take double or extra doses. What may interact with this medicine? Interactions are not expected. This list may not describe all possible interactions. Give your health care provider a list of all the medicines, herbs, non-prescription drugs, or dietary supplements you use. Also tell them if you smoke, drink alcohol, or use illegal drugs. Some items may interact with your medicine. What should I watch for while using this medicine? Do not use for more than 2 weeks without advice from your doctor or health care professional. It can take 2 to 4 days to have a bowel movement and to experience improvement in constipation. See your health care professional for any changes in bowel habits, including constipation, that are severe or last longer than three weeks. Always take this medicine with plenty of water. What side effects may I notice from receiving this medicine? Side effects that you should report to your doctor or health care professional as soon as possible:  diarrhea  difficulty breathing  itching of the skin, hives, or skin rash  severe bloating,  pain, or distension of the stomach  vomiting Side effects that usually do not require medical attention (report to your doctor or health care professional if they continue or are bothersome):  bloating or gas  lower abdominal discomfort or cramps  nausea This list may not describe all possible side effects. Call your doctor for medical advice about side effects. You may report side effects to FDA at 1-800-FDA-1088. Where should I keep my medicine? Keep  out of the reach of children. Store between 15 and 30 degrees C (59 and 86 degrees F). Throw away any unused medicine after the expiration date. NOTE: This sheet is a summary. It may not cover all possible information. If you have questions about this medicine, talk to your doctor, pharmacist, or health care provider.  2020 Elsevier/Gold Standard (2017-10-02 10:42:01)

## 2019-12-22 NOTE — Progress Notes (Signed)
Audrey Campbell [address]  [place]  [place], [place] [address]  Main: [phone]  Fax: [phone]   Gastroenterology Consultation  Referring Provider:     Nobie Putnam * Primary Care Physician:  [name], DO Reason for Consultation:    Reflux, positive Cologuard        HPI:    Chief Complaint  Patient presents with  . Gastroesophageal Reflux    Audrey Campbell is a 64 y.o. y/o female referred for consultation & management  by Dr. Parks Ranger, Devonne Doughty, DO.  Patient reports 67-month history of diffuse abdominal pain, that would worsen after meals.  5/10, dull, nonradiating.  No nausea or vomiting.  Also reported seeing black stool starting about at the same time.  States stool is solid black, not liquidy or tarry black.  Hemoglobin is normal.  No prior colonoscopy.  Cologuard was recently positive.  Is chronically constipated due to chronic pain medications.  States Trulance was prescribed by her pain management doctor but was not covered by insurance.  Does not have a bowel movement for 4 days.  Also reports new heartburn that started about 2 months ago, no dysphagia.  Takes Tums 2-3 times a week for it.  Past Medical History:  Diagnosis Date  . Anxiety   . Arthritis   . Hyperlipidemia   . Hypothyroidism   . Plantar fasciitis     Past Surgical History:  Procedure Laterality Date  . bulging disc in the neck    . DILATION AND CURETTAGE OF UTERUS    . FOOT SURGERY Bilateral   . heel  fasciitis    . RHINOPLASTY    . SHOULDER SURGERY    . TUBAL LIGATION      Prior to Admission medications   Medication Sig Start Date End Date Taking? Authorizing Provider  amLODipine (NORVASC) 10 MG tablet Take 1 tablet (10 mg total) by mouth daily. 10/06/19  Yes Karamalegos, Devonne Doughty, DO  atorvastatin (LIPITOR) 40 MG tablet Take 1 tablet (40 mg total) by mouth daily. 10/06/19  Yes Karamalegos, Devonne Doughty, DO  BREZTRI AEROSPHERE 160-9-4.8  MCG/ACT AERO Inhale 2 puffs into the lungs in the morning and at bedtime. 11/29/19  Yes Karamalegos, Devonne Doughty, DO  butalbital-acetaminophen-caffeine (FIORICET, ESGIC) [phone] MG tablet Take 1 tablet by mouth every 6 (six) hours as needed for headache.  10/14/17  Yes [provider]  Cholecalciferol (VITAMIN D3) 1.25 MG (50000 UT) CAPS TAKE 1 CAPSULE BY MOUTH ONCE A WEEK. 10/07/19  Yes Karamalegos, Devonne Doughty, DO  clonazePAM (KLONOPIN) 0.5 MG tablet Take 0.5 mg by mouth daily.    Yes [provider]  gabapentin (NEURONTIN) 400 MG capsule Take 1 capsule (400 mg total) by mouth 4 (four) times daily. 10/06/19  Yes Karamalegos, Devonne Doughty, DO  levothyroxine (SYNTHROID) 200 MCG tablet Take 1 tablet (200 mcg total) by mouth daily before breakfast. 10/06/19  Yes Karamalegos, Devonne Doughty, DO  meloxicam (MOBIC) 15 MG tablet Take 1 tablet (15 mg total) by mouth daily as needed for pain. 10/06/19  Yes Karamalegos, Alexander J, DO  montelukast (SINGULAIR) 10 MG tablet Take 1 tablet (10 mg total) by mouth at bedtime. 10/06/19  Yes Karamalegos, Devonne Doughty, DO  oxycodone (OXY-IR) 5 MG capsule Take 5 mg by mouth every 4 (four) hours as needed.   Yes [provider]  Plecanatide (TRULANCE) 3 MG TABS Take 1 tablet by mouth 1 day or 1 dose.   Yes [provider]  Polyethylene Glycol 3350 (MIRALAX PO) Take by mouth.   Yes [provider]  QUEtiapine (SEROQUEL) 100 MG tablet Take 1 tablet (100 mg total) by mouth at bedtime. 10/06/19  Yes Karamalegos, Devonne Doughty, DO    Family History  Problem Relation Age of Onset  . Diabetes Father   . Stroke Father 29  . Thyroid disease Father   . Seizures Sister   . Breast cancer Neg Hx      Social History   Tobacco Use  . Smoking status: Current Every Day Smoker    Packs/day: 0.50    Types: Cigarettes  . Smokeless tobacco: Current User  Substance Use Topics  . Alcohol use: No  . Drug use: No    Allergies as of 12/22/2019 -  Review Complete 12/22/2019  Allergen Reaction Noted  . Penicillins Rash and Other (See Comments) 02/23/2013    Review of Systems:    All systems reviewed and negative except where noted in HPI.   Physical Exam:  BP 139/83   Pulse 97   Temp 98.1 F (36.7 C) (Oral)   Ht 5' 4.5" (1.638 m)   Wt 188 lb 3.2 oz (85.4 kg)   BMI 31.81 kg/m  No LMP recorded. Patient is postmenopausal. Psych:  Alert and cooperative. Normal mood and affect. General:   Alert,  Well-developed, well-nourished, pleasant and cooperative in NAD Head:  Normocephalic and atraumatic. Eyes:  Sclera clear, no icterus.   Conjunctiva pink. Ears:  Normal auditory acuity. Nose:  No deformity, discharge, or lesions. Mouth:  No deformity or lesions,oropharynx pink & moist. Neck:  Supple; no masses or thyromegaly. Abdomen:  Normal bowel sounds.  No bruits.  Soft, non-tender and non-distended without masses, hepatosplenomegaly or hernias noted.  No guarding or rebound tenderness.    Msk:  Symmetrical without gross deformities. Good, equal movement & strength bilaterally. Pulses:  Normal pulses noted. Extremities:  No clubbing or edema.  No cyanosis. Neurologic:  Alert and oriented x3;  grossly normal neurologically. Skin:  Intact without significant lesions or rashes. No jaundice. Lymph Nodes:  No significant cervical adenopathy. Psych:  Alert and cooperative. Normal mood and affect.   Labs: CBC    Component Value Date/Time   WBC 6.9 10/06/2019 1002   RBC 5.00 10/06/2019 1002   HGB 13.9 10/06/2019 1002   HCT 43.1 10/06/2019 1002   PLT 226 10/06/2019 1002   MCV 86.2 10/06/2019 1002   MCH 27.8 10/06/2019 1002   MCHC 32.3 10/06/2019 1002   RDW 14.7 10/06/2019 1002   LYMPHSABS 2,781 10/06/2019 1002   MONOABS 0.4 12/09/2017 2359   EOSABS 179 10/06/2019 1002   BASOSABS 69 10/06/2019 1002   CMP     Component Value Date/Time   NA 136 10/06/2019 1002   K 4.6 10/06/2019 1002   CL 98 10/06/2019 1002   CO2 31  10/06/2019 1002   GLUCOSE 108 (H) 10/06/2019 1002   BUN 8 10/06/2019 1002   CREATININE 0.95 10/06/2019 1002   CALCIUM 9.6 10/06/2019 1002   PROT 7.0 10/06/2019 1002   AST 14 10/06/2019 1002   ALT 10 10/06/2019 1002   BILITOT 0.4 10/06/2019 1002   GFRNONAA 63 10/06/2019 1002   GFRAA 73 10/06/2019 1002    Imaging Studies: No results found.  Assessment and Plan:   APRILE DICKENSON is a 64 y.o. y/o female has been referred for reflux, abdominal pain, and positive Cologuard  Abdominal pain is diffuse and is likely from a combination of constipation and  reflux  Due to new reflux and ongoing heartburn would recommend EGD for further evaluation.   Patient also needs colonoscopy for positive Cologuard  Patient has not been on MiraLAX in the past consistently.  Start MiraLAX daily  High-fiber diet MiraLAX daily with goal of 1-2 soft bowel movements daily.  If not at goal, patient instructed to increase dose to twice daily.  If loose stools with the medication, patient asked to decrease the medication to every other day, or half dose daily.  Patient verbalized understanding  If patient is not having a bowel movement at least every other day despite above MiraLAX, will need to change management to pharmacologic therapy  Dr Audrey Campbell  Speech recognition software was used to dictate the above note.

## 2019-12-27 ENCOUNTER — Other Ambulatory Visit: Payer: Self-pay

## 2019-12-27 ENCOUNTER — Other Ambulatory Visit
Admission: RE | Admit: 2019-12-27 | Discharge: 2019-12-27 | Disposition: A | Discharge status: Home / Self Care | Payer: Medicare Other | Source: Ambulatory Visit | Attending: Gastroenterology | Admitting: Gastroenterology

## 2019-12-27 DIAGNOSIS — Z01812 Encounter for preprocedural laboratory examination: Secondary | ICD-10-CM | POA: Insufficient documentation

## 2019-12-27 DIAGNOSIS — Z20822 Contact with and (suspected) exposure to covid-19: Secondary | ICD-10-CM | POA: Insufficient documentation

## 2019-12-27 LAB — SARS CORONAVIRUS 2 (TAT 6-24 HRS): SARS Coronavirus 2: NEGATIVE

## 2019-12-29 ENCOUNTER — Other Ambulatory Visit: Payer: Self-pay

## 2019-12-29 ENCOUNTER — Ambulatory Visit
Admission: RE | Admit: 2019-12-29 | Discharge: 2019-12-29 | Disposition: A | Discharge status: Home / Self Care | Payer: Medicare Other | Attending: Gastroenterology | Admitting: Gastroenterology

## 2019-12-29 ENCOUNTER — Ambulatory Visit: Payer: Medicare Other | Admitting: Certified Registered"

## 2019-12-29 ENCOUNTER — Encounter
Admission: RE | Disposition: A | Discharge status: Home / Self Care | Payer: Self-pay | Source: Home / Self Care | Attending: Gastroenterology

## 2019-12-29 ENCOUNTER — Encounter: Payer: Self-pay | Admitting: Gastroenterology

## 2019-12-29 DIAGNOSIS — R109 Unspecified abdominal pain: Secondary | ICD-10-CM | POA: Insufficient documentation

## 2019-12-29 DIAGNOSIS — E785 Hyperlipidemia, unspecified: Secondary | ICD-10-CM | POA: Diagnosis not present

## 2019-12-29 DIAGNOSIS — F17210 Nicotine dependence, cigarettes, uncomplicated: Secondary | ICD-10-CM | POA: Insufficient documentation

## 2019-12-29 DIAGNOSIS — K228 Other specified diseases of esophagus: Secondary | ICD-10-CM | POA: Insufficient documentation

## 2019-12-29 DIAGNOSIS — R1084 Generalized abdominal pain: Secondary | ICD-10-CM

## 2019-12-29 DIAGNOSIS — Z79890 Hormone replacement therapy (postmenopausal): Secondary | ICD-10-CM | POA: Insufficient documentation

## 2019-12-29 DIAGNOSIS — R12 Heartburn: Secondary | ICD-10-CM | POA: Diagnosis not present

## 2019-12-29 DIAGNOSIS — Z791 Long term (current) use of non-steroidal anti-inflammatories (NSAID): Secondary | ICD-10-CM | POA: Insufficient documentation

## 2019-12-29 DIAGNOSIS — Z9981 Dependence on supplemental oxygen: Secondary | ICD-10-CM | POA: Insufficient documentation

## 2019-12-29 DIAGNOSIS — J449 Chronic obstructive pulmonary disease, unspecified: Secondary | ICD-10-CM | POA: Diagnosis not present

## 2019-12-29 DIAGNOSIS — R195 Other fecal abnormalities: Secondary | ICD-10-CM | POA: Insufficient documentation

## 2019-12-29 DIAGNOSIS — E039 Hypothyroidism, unspecified: Secondary | ICD-10-CM | POA: Insufficient documentation

## 2019-12-29 DIAGNOSIS — K5790 Diverticulosis of intestine, part unspecified, without perforation or abscess without bleeding: Secondary | ICD-10-CM | POA: Diagnosis not present

## 2019-12-29 DIAGNOSIS — M1990 Unspecified osteoarthritis, unspecified site: Secondary | ICD-10-CM | POA: Diagnosis not present

## 2019-12-29 DIAGNOSIS — Z79899 Other long term (current) drug therapy: Secondary | ICD-10-CM | POA: Insufficient documentation

## 2019-12-29 DIAGNOSIS — F419 Anxiety disorder, unspecified: Secondary | ICD-10-CM | POA: Insufficient documentation

## 2019-12-29 DIAGNOSIS — I10 Essential (primary) hypertension: Secondary | ICD-10-CM | POA: Diagnosis not present

## 2019-12-29 HISTORY — DX: Essential (primary) hypertension: I10

## 2019-12-29 HISTORY — PX: ESOPHAGOGASTRODUODENOSCOPY (EGD) WITH PROPOFOL: SHX5813

## 2019-12-29 HISTORY — PX: COLONOSCOPY WITH PROPOFOL: SHX5780

## 2019-12-29 HISTORY — DX: Dependence on supplemental oxygen: Z99.81

## 2019-12-29 LAB — KOH PREP: KOH Prep: NONE SEEN

## 2019-12-29 SURGERY — ESOPHAGOGASTRODUODENOSCOPY (EGD) WITH PROPOFOL
Anesthesia: General

## 2019-12-29 MED ORDER — LIDOCAINE HCL (CARDIAC) PF 100 MG/5ML IV SOSY
PREFILLED_SYRINGE | INTRAVENOUS | Status: DC | PRN
  Administered 2019-12-29: 100 mg via INTRAVENOUS

## 2019-12-29 MED ORDER — GLYCOPYRROLATE 0.2 MG/ML IJ SOLN
INTRAMUSCULAR | Status: DC | PRN
  Administered 2019-12-29: .2 mg via INTRAVENOUS

## 2019-12-29 MED ORDER — PROPOFOL 500 MG/50ML IV EMUL
INTRAVENOUS | Status: DC | PRN
  Administered 2019-12-29: 155 ug/kg/min via INTRAVENOUS

## 2019-12-29 MED ORDER — SODIUM CHLORIDE 0.9 % IV SOLN: INTRAVENOUS | Status: DC

## 2019-12-29 MED ORDER — TRULANCE 3 MG PO TABS: 3.0000 mg | ORAL_TABLET | Freq: Every day | ORAL | 2 refills | Status: DC

## 2019-12-29 MED ORDER — PROPOFOL 10 MG/ML IV BOLUS
INTRAVENOUS | Status: DC | PRN
  Administered 2019-12-29: 10 mg via INTRAVENOUS
  Administered 2019-12-29: 55 mg via INTRAVENOUS
  Administered 2019-12-29: 10 mg via INTRAVENOUS
  Administered 2019-12-29: 155 mg via INTRAVENOUS

## 2019-12-29 NOTE — Anesthesia Postprocedure Evaluation (Signed)
Anesthesia Post Note  Patient: Audrey Campbell  Procedure(s) Performed: ESOPHAGOGASTRODUODENOSCOPY (EGD) WITH PROPOFOL (N/A ) COLONOSCOPY WITH PROPOFOL (N/A )  Patient location during evaluation: Endoscopy Anesthesia Type: General Level of consciousness: awake and alert Pain management: pain level controlled Vital Signs Assessment: post-procedure vital signs reviewed and stable Respiratory status: spontaneous breathing, nonlabored ventilation, respiratory function stable and patient connected to nasal cannula oxygen Cardiovascular status: blood pressure returned to baseline and stable Postop Assessment: no apparent nausea or vomiting Anesthetic complications: no   No complications documented.   Last Vitals:  Vitals:   12/29/19 1151 12/29/19 1201  BP: (!) 145/95 140/73  Pulse: 83 82  Resp: (!) 23 13  Temp:    SpO2: 97% 98%    Last Pain:  Vitals:   12/29/19 1201  TempSrc:   PainSc: 6                  Precious Haws Lylianna Fraiser

## 2019-12-29 NOTE — Op Note (Signed)
[facility] Gastroenterology Patient Name: Audrey Campbell Procedure Date: 12/29/2019 10:41 AM MRN: [id number] Account #: [id number] Date of Birth: 18-May-1955 Admit Type: Outpatient Age: 64 Room: [facility] ENDO ROOM 4 Gender: Female Note Status: Finalized Procedure:             Colonoscopy Indications:           Positive Cologuard test Providers:             Skyleen Bentley B. Bonna Gains MD, MD Referring MD:          [name] (Referring MD) Medicines:             Monitored Anesthesia Care Complications:         No immediate complications. Procedure:             Pre-Anesthesia Assessment:                        - Prior to the procedure, a History and Physical was                         performed, and patient medications, allergies and                         sensitivities were reviewed. The patient's tolerance                         of previous anesthesia was reviewed.                        - The risks and benefits of the procedure and the                         sedation options and risks were discussed with the                         patient. All questions were answered and informed                         consent was obtained.                        - Patient identification and proposed procedure were                         verified prior to the procedure by the physician, the                         nurse, the anesthetist and the technician. The                         procedure was verified in the pre-procedure area in                         the procedure room in the endoscopy suite.                        - ASA Grade Assessment: II - A patient with mild  systemic disease.                        - After reviewing the risks and benefits, the patient                         was deemed in satisfactory condition to undergo the                         procedure.                        After obtaining informed consent, the colonoscope  was                         passed under direct vision. Throughout the procedure,                         the patient's blood pressure, pulse, and oxygen                         saturations were monitored continuously. The                         Colonoscope was introduced through the anus and                         advanced to the the cecum, identified by appendiceal                         orifice and ileocecal valve. The colonoscopy was                         performed with ease. The patient tolerated the                         procedure well. The quality of the bowel preparation                         was poor. cecum was not able to be cleared adequately                         despite water jet and water syringe, the rest of the                         colon was able to cleared partially. Findings:      The perianal and digital rectal examinations were normal.      A large amount of stool was found in the entire colon, interfering with       visualization.      The exam was otherwise normal throughout the examined colon.      The retroflexed view of the distal rectum and anal verge was normal and       showed no anal or rectal abnormalities. Impression:            - Preparation of the colon was poor.                        - Stool in the entire examined colon.                        -  The distal rectum and anal verge are normal on                         retroflexion view.                        - No specimens collected. Recommendation:        - Pt should follow up in clinic to discuss treatment                         for constipation as pt had poor prep despite drinking                         an extended prep for this exam. After treatment of                         constipation for the next 2 weeks colonoscopy can                         follow.                        - Discharge patient to home.                        - Resume previous diet.                        -  Continue present medications.                        - Repeat colonoscopy at the next available appointment                         because the bowel preparation was poor.                        - Return to primary care physician as previously                         scheduled.                        - The findings and recommendations were discussed with                         the patient.                        - The findings and recommendations were discussed with                         the patient's family. Procedure Code(s):     --- Professional ---                        [phone], Colonoscopy, flexible; diagnostic, including                         collection of specimen(s) by brushing or washing, when  performed (separate procedure) Diagnosis Code(s):     --- Professional ---                        R19.5, Other fecal abnormalities CPT copyright 2019 American Medical Association. All rights reserved. The codes documented in this report are preliminary and upon coder review may  be revised to meet current compliance requirements.  Vonda Antigua, MD Margretta Sidle B. Bonna Gains MD, MD 12/29/2019 11:37:36 AM This report has been signed electronically. Number of Addenda: 0 Note Initiated On: 12/29/2019 10:41 AM Scope Withdrawal Time: 0 hours 11 minutes 27 seconds  Total Procedure Duration: 0 hours 25 minutes 5 seconds       [facility]

## 2019-12-29 NOTE — Anesthesia Procedure Notes (Signed)
Procedure Name: General with mask airway Performed by: Fletcher-Harrison, Emmerson Shuffield, CRNA Pre-anesthesia Checklist: Patient identified, Emergency Drugs available, Suction available and Patient being monitored Patient Re-evaluated:Patient Re-evaluated prior to induction Oxygen Delivery Method: Simple face mask Induction Type: IV induction Placement Confirmation: positive ETCO2 and CO2 detector Dental Injury: Teeth and Oropharynx as per pre-operative assessment        

## 2019-12-29 NOTE — Transfer of Care (Signed)
Immediate Anesthesia Transfer of Care Note  Patient: MABEL ROLL  Procedure(s) Performed: ESOPHAGOGASTRODUODENOSCOPY (EGD) WITH PROPOFOL (N/A ) COLONOSCOPY WITH PROPOFOL (N/A )  Patient Location: Endoscopy Unit  Anesthesia Type:General  Level of Consciousness: drowsy and responds to stimulation  Airway & Oxygen Therapy: Patient Spontanous Breathing and Patient connected to face mask oxygen  Post-op Assessment: Report given to RN and Post -op Vital signs reviewed and stable  Post vital signs: Reviewed and stable  Last Vitals:  Vitals Value Taken Time  BP 169/90 12/29/19 1131  Temp 36.3 C 12/29/19 1131  Pulse 85 12/29/19 1133  Resp 21 12/29/19 1133  SpO2 100 % 12/29/19 1133  Vitals shown include unvalidated device data.  Last Pain:  Vitals:   12/29/19 1131  TempSrc: Temporal  PainSc: Asleep         Complications: No complications documented.

## 2019-12-29 NOTE — H&P (Signed)
Audrey Antigua, MD [address] [place], [place], [place], [address] [address], [place], [place], [place], [address] Phone: [phone]  Fax: [phone]  Primary Care Physician:  [name], DO   Pre-Procedure History & Physical: HPI:  Audrey Campbell is a 64 y.o. female is here for a colonoscopy and EGD.   Past Medical History:  Diagnosis Date  . Anxiety   . Arthritis   . Hyperlipidemia   . Hypothyroidism   . Plantar fasciitis     Past Surgical History:  Procedure Laterality Date  . bulging disc in the neck    . DILATION AND CURETTAGE OF UTERUS    . FOOT SURGERY Bilateral   . heel  fasciitis    . RHINOPLASTY    . SHOULDER SURGERY    . TUBAL LIGATION      Prior to Admission medications   Medication Sig Start Date End Date Taking? Authorizing Provider  amLODipine (NORVASC) 10 MG tablet Take 1 tablet (10 mg total) by mouth daily. 10/06/19  Yes Karamalegos, Devonne Doughty, DO  atorvastatin (LIPITOR) 40 MG tablet Take 1 tablet (40 mg total) by mouth daily. 10/06/19  Yes Karamalegos, Devonne Doughty, DO  BREZTRI AEROSPHERE 160-9-4.8 MCG/ACT AERO Inhale 2 puffs into the lungs in the morning and at bedtime. 11/29/19  Yes Karamalegos, Devonne Doughty, DO  Cholecalciferol (VITAMIN D3) 1.25 MG (50000 UT) CAPS TAKE 1 CAPSULE BY MOUTH ONCE A WEEK. 10/07/19  Yes Karamalegos, Devonne Doughty, DO  clonazePAM (KLONOPIN) 0.5 MG tablet Take 0.5 mg by mouth daily.    Yes [provider]  gabapentin (NEURONTIN) 400 MG capsule Take 1 capsule (400 mg total) by mouth 4 (four) times daily. 10/06/19  Yes Karamalegos, Alexander J, DO  montelukast (SINGULAIR) 10 MG tablet Take 1 tablet (10 mg total) by mouth at bedtime. 10/06/19  Yes Karamalegos, Devonne Doughty, DO  oxycodone (OXY-IR) 5 MG capsule Take 5 mg by mouth every 4 (four) hours as needed.   Yes [provider]  QUEtiapine (SEROQUEL) 100 MG tablet Take 1 tablet (100 mg total) by mouth at bedtime. 10/06/19  Yes Karamalegos,  Devonne Doughty, DO  butalbital-acetaminophen-caffeine (FIORICET, ESGIC) [phone] MG tablet Take 1 tablet by mouth every 6 (six) hours as needed for headache.  10/14/17   [provider]  levothyroxine (SYNTHROID) 200 MCG tablet Take 1 tablet (200 mcg total) by mouth daily before breakfast. 10/06/19   Karamalegos, Devonne Doughty, DO  meloxicam (MOBIC) 15 MG tablet Take 1 tablet (15 mg total) by mouth daily as needed for pain. 10/06/19   Karamalegos, Alexander J, DO  Na Sulfate-K Sulfate-Mg Sulf 17.5-3.13-1.6 GM/177ML SOLN At 5 PM two days before procedure take 2 bottles with ice cold water until it is all gone. Then at 5 PM the day before your procedure take 1 bottle with ice cold water until all gone and 5 hours before your procedure take 1 bottle until all gone. 12/22/19   Virgel Manifold, MD  Plecanatide (TRULANCE) 3 MG TABS Take 1 tablet by mouth 1 day or 1 dose.    [provider]  Polyethylene Glycol 3350 (MIRALAX PO) Take by mouth.    [provider]    Allergies as of 12/23/2019 - Review Complete 12/22/2019  Allergen Reaction Noted  . Penicillins Rash and Other (See Comments) 02/23/2013    Family History  Problem Relation Age of Onset  . Diabetes Father   . Stroke Father 73  . Thyroid disease Father   .  Seizures Sister   . Breast cancer Neg Hx     Social History   Socioeconomic History  . Marital status: Widowed    Spouse name: Not on file  . Number of children: Not on file  . Years of education: high school  . Highest education level: High school graduate  Occupational History  . Not on file  Tobacco Use  . Smoking status: Current Every Day Smoker    Packs/day: 0.50    Types: Cigarettes  . Smokeless tobacco: Current User  Substance and Sexual Activity  . Alcohol use: No  . Drug use: No  . Sexual activity: Yes    Birth control/protection: None  Other Topics Concern  . Not on file  Social History Narrative  . Not on file   Social Determinants  of Health   Financial Resource Strain:   . Difficulty of Paying Living Expenses: Not on file  Food Insecurity:   . Worried About Charity fundraiser in the Last Year: Not on file  . Ran Out of Food in the Last Year: Not on file  Transportation Needs:   . Lack of Transportation (Medical): Not on file  . Lack of Transportation (Non-Medical): Not on file  Physical Activity:   . Days of Exercise per Week: Not on file  . Minutes of Exercise per Session: Not on file  Stress:   . Feeling of Stress : Not on file  Social Connections:   . Frequency of Communication with Friends and Family: Not on file  . Frequency of Social Gatherings with Friends and Family: Not on file  . Attends Religious Services: Not on file  . Active Member of Clubs or Organizations: Not on file  . Attends Archivist Meetings: Not on file  . Marital Status: Not on file  Intimate Partner Violence:   . Fear of Current or Ex-Partner: Not on file  . Emotionally Abused: Not on file  . Physically Abused: Not on file  . Sexually Abused: Not on file    Review of Systems: See HPI, otherwise negative ROS  Physical Exam: There were no vitals taken for this visit. General:   Alert,  pleasant and cooperative in NAD Head:  Normocephalic and atraumatic. Neck:  Supple; no masses or thyromegaly. Lungs:  Clear throughout to auscultation, normal respiratory effort.    Heart:  +S1, +S2, Regular rate and rhythm, No edema. Abdomen:  Soft, nontender and nondistended. Normal bowel sounds, without guarding, and without rebound.   Neurologic:  Alert and  oriented x4;  grossly normal neurologically.  Impression/Plan: Audrey Campbell is here for a colonoscopy to be performed for positive cologuard and EGD for Acid Reflux.  Risks, benefits, limitations, and alternatives regarding the procedures have been reviewed with the patient.  Questions have been answered.  All parties agreeable.   Virgel Manifold, MD  12/29/2019,  10:16 AM

## 2019-12-29 NOTE — Anesthesia Preprocedure Evaluation (Signed)
Anesthesia Evaluation  Patient identified by MRN, date of birth, ID band Patient awake    Reviewed: Allergy & Precautions, H&P , NPO status , Patient's Chart, lab work & pertinent test results  History of Anesthesia Complications Negative for: history of anesthetic complications  Airway Mallampati: III  TM Distance: <3 FB Neck ROM: limited    Dental  (+) Chipped, Poor Dentition, Missing   Pulmonary COPD,  COPD inhaler and oxygen dependent, Current Smoker and Patient abstained from smoking.,    Pulmonary exam normal        Cardiovascular Exercise Tolerance: Good hypertension, (-) angina(-) Past MI Normal cardiovascular exam     Neuro/Psych PSYCHIATRIC DISORDERS  Neuromuscular disease negative neurological ROS     GI/Hepatic negative GI ROS, Neg liver ROS, neg GERD  ,  Endo/Other  Hypothyroidism   Renal/GU negative Renal ROS  negative genitourinary   Musculoskeletal  (+) Arthritis ,   Abdominal   Peds  Hematology negative hematology ROS (+)   Anesthesia Other Findings Past Medical History: No date: Anxiety No date: Arthritis No date: COPD (chronic obstructive pulmonary disease) (HCC) No date: Hyperlipidemia No date: Hypertension No date: Hypothyroidism No date: On home oxygen therapy No date: Plantar fasciitis  Past Surgical History: No date: bulging disc in the neck No date: DILATION AND CURETTAGE OF UTERUS No date: FOOT SURGERY; Bilateral No date: heel  fasciitis No date: RHINOPLASTY No date: SHOULDER SURGERY No date: TUBAL LIGATION  BMI    Body Mass Index: 30.62 kg/m      Reproductive/Obstetrics negative OB ROS                             Anesthesia Physical Anesthesia Plan  ASA: III  Anesthesia Plan: General   Post-op Pain Management:    Induction: Intravenous  PONV Risk Score and Plan: Propofol infusion and TIVA  Airway Management Planned: Natural Airway and  Nasal Cannula  Additional Equipment:   Intra-op Plan:   Post-operative Plan:   Informed Consent: I have reviewed the patients History and Physical, chart, labs and discussed the procedure including the risks, benefits and alternatives for the proposed anesthesia with the patient or authorized representative who has indicated his/her understanding and acceptance.     Dental Advisory Given  Plan Discussed with: Anesthesiologist, CRNA and Surgeon  Anesthesia Plan Comments: (Patient consented for risks of anesthesia including but not limited to:  - adverse reactions to medications - risk of intubation if required - damage to eyes, teeth, lips or other oral mucosa - nerve damage due to positioning  - sore throat or hoarseness - Damage to heart, brain, nerves, lungs, other parts of body or loss of life  Patient voiced understanding.)        Anesthesia Quick Evaluation

## 2019-12-29 NOTE — Op Note (Signed)
[facility] Gastroenterology Patient Name: Audrey Campbell Procedure Date: 12/29/2019 10:42 AM MRN: [id number] Account #: [id number] Date of Birth: 1955-09-17 Admit Type: Outpatient Age: 64 Room: [facility] ENDO ROOM 4 Gender: Female Note Status: Finalized Procedure:             Upper GI endoscopy Indications:           Abdominal pain, Heartburn Providers:             Myca Perno B. Bonna Gains MD, MD Referring MD:          [name] (Referring MD) Medicines:             Monitored Anesthesia Care Complications:         No immediate complications. Procedure:             Pre-Anesthesia Assessment:                        - Prior to the procedure, a History and Physical was                         performed, and patient medications, allergies and                         sensitivities were reviewed. The patient's tolerance                         of previous anesthesia was reviewed.                        - The risks and benefits of the procedure and the                         sedation options and risks were discussed with the                         patient. All questions were answered and informed                         consent was obtained.                        - Patient identification and proposed procedure were                         verified prior to the procedure by the physician, the                         nurse, the anesthesiologist, the anesthetist and the                         technician. The procedure was verified in the                         procedure room.                        - ASA Grade Assessment: II - A patient with mild                         systemic disease.  After obtaining informed consent, the endoscope was                         passed under direct vision. Throughout the procedure,                         the patient's blood pressure, pulse, and oxygen                         saturations were monitored  continuously. The Endoscope                         was introduced through the mouth, and advanced to the                         second part of duodenum. The upper GI endoscopy was                         accomplished with ease. The patient tolerated the                         procedure well. Findings:      White nummular lesions were noted in the proximal esophagus. Brushings       for KOH prep were obtained.      The examined esophagus was normal.      The entire examined stomach was normal. Biopsies were obtained in the       gastric body, at the incisura and in the gastric antrum with cold       forceps for histology. Biopsies were taken with a cold forceps for       Helicobacter pylori testing.      The duodenal bulb, second portion of the duodenum and examined duodenum       were normal. Impression:            - White nummular lesions in esophageal mucosa.                         Brushings performed.                        - Normal esophagus.                        - Normal stomach. Biopsied.                        - Normal duodenal bulb, second portion of the duodenum                         and examined duodenum.                        - Biopsies were obtained in the gastric body, at the                         incisura and in the gastric antrum. Recommendation:        - Await pathology results.                        -  Discharge patient to home (with escort).                        - Advance diet as tolerated.                        - Continue present medications.                        - Patient has a contact number available for                         emergencies. The signs and symptoms of potential                         delayed complications were discussed with the patient.                         Return to normal activities tomorrow. Written                         discharge instructions were provided to the patient.                        - Discharge patient to home  (with escort).                        - The findings and recommendations were discussed with                         the patient.                        - The findings and recommendations were discussed with                         the patient's family. Procedure Code(s):     --- Professional ---                        [phone], Esophagogastroduodenoscopy, flexible,                         transoral; with biopsy, single or multiple Diagnosis Code(s):     --- Professional ---                        K22.8, Other specified diseases of esophagus                        R10.9, Unspecified abdominal pain                        R12, Heartburn CPT copyright 2019 American Medical Association. All rights reserved. The codes documented in this report are preliminary and upon coder review may  be revised to meet current compliance requirements.  Vonda Antigua, MD Margretta Sidle B. Bonna Gains MD, MD 12/29/2019 11:00:46 AM This report has been signed electronically. Number of Addenda: 0 Note Initiated On: 12/29/2019 10:42 AM Estimated Blood Loss:  Estimated blood loss: none.      [facility]

## 2019-12-30 ENCOUNTER — Encounter: Payer: Self-pay | Admitting: Gastroenterology

## 2019-12-30 LAB — SURGICAL PATHOLOGY

## 2020-01-04 ENCOUNTER — Encounter: Payer: Self-pay | Admitting: Gastroenterology

## 2020-01-13 ENCOUNTER — Encounter: Payer: Self-pay | Admitting: Gastroenterology

## 2020-01-13 ENCOUNTER — Ambulatory Visit: Payer: Medicare Other | Admitting: Gastroenterology

## 2020-01-13 ENCOUNTER — Other Ambulatory Visit: Payer: Self-pay

## 2020-01-13 VITALS — BP 130/81 | HR 76 | Temp 98.0°F | Ht 64.5 in | Wt 188.6 lb

## 2020-01-13 DIAGNOSIS — K5900 Constipation, unspecified: Secondary | ICD-10-CM

## 2020-01-13 DIAGNOSIS — R195 Other fecal abnormalities: Secondary | ICD-10-CM | POA: Diagnosis not present

## 2020-01-13 MED ORDER — SENNOSIDES 15 MG PO TABS
15.0000 mg | ORAL_TABLET | Freq: Two times a day (BID) | ORAL | 1 refills | Status: AC
Start: 2020-01-13 — End: 2020-02-12

## 2020-01-13 NOTE — Patient Instructions (Signed)

## 2020-01-14 NOTE — Progress Notes (Signed)
Vonda Antigua, MD [address]  [place], [place] [address]  Main: [phone]  Fax: [phone]   Primary Care Physician: [name], DO   Chief Complaint  Patient presents with  . Follow-up    positive cologuard    HPI: Audrey Campbell is a 64 y.o. female with recent positive Cologuard here for follow-up.  Patient underwent colonoscopy which showed poor prep.  Extent of exam was cecum.  Patient had already taken an extended prep for this procedure and was still unable to be cleaned out well.  This is due to her chronic opioid-induced constipation.  Patient is on Trulance and still not having good results.  No blood in stool.  Current Outpatient Medications  Medication Sig Dispense Refill  . amLODipine (NORVASC) 10 MG tablet Take 1 tablet (10 mg total) by mouth daily. 90 tablet 1  . atorvastatin (LIPITOR) 40 MG tablet Take 1 tablet (40 mg total) by mouth daily. 90 tablet 1  . BREZTRI AEROSPHERE 160-9-4.8 MCG/ACT AERO Inhale 2 puffs into the lungs in the morning and at bedtime. 10.7 g 2  . butalbital-acetaminophen-caffeine (FIORICET, ESGIC) 50-325-40 MG tablet Take 1 tablet by mouth every 6 (six) hours as needed for headache.   4  . Cholecalciferol (VITAMIN D3) 1.25 MG (50000 UT) CAPS TAKE 1 CAPSULE BY MOUTH ONCE A WEEK. 12 capsule 0  . gabapentin (NEURONTIN) 400 MG capsule Take 1 capsule (400 mg total) by mouth 4 (four) times daily. 360 capsule 1  . levothyroxine (SYNTHROID) 200 MCG tablet Take 1 tablet (200 mcg total) by mouth daily before breakfast. 90 tablet 1  . meloxicam (MOBIC) 15 MG tablet Take 1 tablet (15 mg total) by mouth daily as needed for pain. 90 tablet 1  . montelukast (SINGULAIR) 10 MG tablet Take 1 tablet (10 mg total) by mouth at bedtime. 90 tablet 1  . Na Sulfate-K Sulfate-Mg Sulf 17.5-3.13-1.6 GM/177ML SOLN At 5 PM two days before procedure take 2 bottles with ice cold water until it is all gone. Then at 5 PM the day before  your procedure take 1 bottle with ice cold water until all gone and 5 hours before your procedure take 1 bottle until all gone. 708 mL 0  . oxycodone (OXY-IR) 5 MG capsule Take 5 mg by mouth every 4 (four) hours as needed.    . Polyethylene Glycol 3350 (MIRALAX PO) Take by mouth.    . QUEtiapine (SEROQUEL) 100 MG tablet Take 1 tablet (100 mg total) by mouth at bedtime. 90 tablet 1  . clonazePAM (KLONOPIN) 0.5 MG tablet Take 0.5 mg by mouth daily.  (Patient not taking: Reported on 01/13/2020)    . Sennosides 15 MG TABS Take 1 tablet (15 mg total) by mouth in the morning and at bedtime. 60 tablet 1   No current facility-administered medications for this visit.    Allergies as of 01/13/2020 - Review Complete 01/13/2020  Allergen Reaction Noted  . Penicillins Rash and Other (See Comments) 02/23/2013    ROS:  General: Negative for anorexia, weight loss, fever, chills, fatigue, weakness. ENT: Negative for hoarseness, difficulty swallowing , nasal congestion. CV: Negative for chest pain, angina, palpitations, dyspnea on exertion, peripheral edema.  Respiratory: Negative for dyspnea at rest, dyspnea on exertion, cough, sputum, wheezing.  GI: See history of present illness. GU:  Negative for dysuria, hematuria, urinary incontinence, urinary frequency, nocturnal urination.  Endo: Negative for unusual weight change.    Physical Examination:  BP 130/81 (BP Location: Right Arm, Patient Position: Sitting, Cuff Size: Large)   Pulse 76   Temp 98 F (36.7 C) (Oral)   Ht 5' 4.5" (1.638 m)   Wt 188 lb 9.6 oz (85.5 kg)   BMI 31.87 kg/m   General: Well-nourished, well-developed in no acute distress.  Eyes: No icterus. Conjunctivae pink. Mouth: Oropharyngeal mucosa moist and pink , no lesions erythema or exudate. Neck: Supple, Trachea midline Abdomen: Bowel sounds are normal, nontender, nondistended, no hepatosplenomegaly or masses, no abdominal bruits or hernia , no rebound or guarding.     Extremities: No lower extremity edema. No clubbing or deformities. Neuro: Alert and oriented x 3.  Grossly intact. Skin: Warm and dry, no jaundice.   Psych: Alert and cooperative, normal mood and affect.   Labs: CMP     Component Value Date/Time   NA 136 10/06/2019 1002   K 4.6 10/06/2019 1002   CL 98 10/06/2019 1002   CO2 31 10/06/2019 1002   GLUCOSE 108 (H) 10/06/2019 1002   BUN 8 10/06/2019 1002   CREATININE 0.95 10/06/2019 1002   CALCIUM 9.6 10/06/2019 1002   PROT 7.0 10/06/2019 1002   AST 14 10/06/2019 1002   ALT 10 10/06/2019 1002   BILITOT 0.4 10/06/2019 1002   GFRNONAA 63 10/06/2019 1002   GFRAA 73 10/06/2019 1002   Lab Results  Component Value Date   WBC 6.9 10/06/2019   HGB 13.9 10/06/2019   HCT 43.1 10/06/2019   MCV 86.2 10/06/2019   PLT 226 10/06/2019    Imaging Studies: No results found.  Assessment and Plan:   Audrey Campbell is a 64 y.o. y/o female here for follow-up of positive Cologuard  The colonoscopy was complete, large lesions were ruled out, but due to poor prep that could not be cleaned out well, patient still needs repeat colonoscopy  I have encouraged her to talk to her pain management doctor as patient is interested in weaning off her opioids.  This will help her constipation immensely.  Since Trulance is not working, we will start twice daily dosing of stimulant laxative, senna and monitor for results  Since patient already had an extended prep with last colonoscopy, it would be important to get her constipation resolved and then repeat colonoscopy  Follow-up in clinic closely in 2 to 3 weeks to reassess symptoms and change therapy if needed  Dr Vonda Antigua

## 2020-01-18 DIAGNOSIS — J449 Chronic obstructive pulmonary disease, unspecified: Secondary | ICD-10-CM | POA: Diagnosis not present

## 2020-01-26 DIAGNOSIS — M542 Cervicalgia: Secondary | ICD-10-CM | POA: Diagnosis not present

## 2020-01-26 DIAGNOSIS — Z79891 Long term (current) use of opiate analgesic: Secondary | ICD-10-CM | POA: Diagnosis not present

## 2020-01-26 DIAGNOSIS — M25519 Pain in unspecified shoulder: Secondary | ICD-10-CM | POA: Diagnosis not present

## 2020-01-26 DIAGNOSIS — Z79899 Other long term (current) drug therapy: Secondary | ICD-10-CM | POA: Diagnosis not present

## 2020-01-26 DIAGNOSIS — M545 Low back pain: Secondary | ICD-10-CM | POA: Diagnosis not present

## 2020-01-26 DIAGNOSIS — G894 Chronic pain syndrome: Secondary | ICD-10-CM | POA: Diagnosis not present

## 2020-01-28 DIAGNOSIS — M4802 Spinal stenosis, cervical region: Secondary | ICD-10-CM | POA: Diagnosis not present

## 2020-01-28 DIAGNOSIS — M50322 Other cervical disc degeneration at C5-C6 level: Secondary | ICD-10-CM | POA: Diagnosis not present

## 2020-01-28 DIAGNOSIS — M545 Low back pain, unspecified: Secondary | ICD-10-CM | POA: Diagnosis not present

## 2020-01-28 DIAGNOSIS — M47816 Spondylosis without myelopathy or radiculopathy, lumbar region: Secondary | ICD-10-CM | POA: Diagnosis not present

## 2020-01-28 DIAGNOSIS — M5032 Other cervical disc degeneration, mid-cervical region, unspecified level: Secondary | ICD-10-CM | POA: Diagnosis not present

## 2020-01-28 DIAGNOSIS — M50323 Other cervical disc degeneration at C6-C7 level: Secondary | ICD-10-CM | POA: Diagnosis not present

## 2020-02-07 ENCOUNTER — Ambulatory Visit: Payer: Medicare Other | Admitting: Family Medicine

## 2020-02-11 ENCOUNTER — Encounter: Payer: Self-pay | Admitting: Family Medicine

## 2020-02-11 ENCOUNTER — Other Ambulatory Visit: Payer: Self-pay

## 2020-02-11 ENCOUNTER — Ambulatory Visit (INDEPENDENT_AMBULATORY_CARE_PROVIDER_SITE_OTHER): Payer: Medicare Other | Admitting: Family Medicine

## 2020-02-11 VITALS — BP 142/80 | HR 77 | Temp 97.6°F | Resp 16 | Ht 64.0 in | Wt 187.0 lb

## 2020-02-11 DIAGNOSIS — F5104 Psychophysiologic insomnia: Secondary | ICD-10-CM

## 2020-02-11 DIAGNOSIS — J432 Centrilobular emphysema: Secondary | ICD-10-CM

## 2020-02-11 DIAGNOSIS — F3341 Major depressive disorder, recurrent, in partial remission: Secondary | ICD-10-CM

## 2020-02-11 DIAGNOSIS — I10 Essential (primary) hypertension: Secondary | ICD-10-CM

## 2020-02-11 DIAGNOSIS — F17200 Nicotine dependence, unspecified, uncomplicated: Secondary | ICD-10-CM

## 2020-02-11 DIAGNOSIS — E039 Hypothyroidism, unspecified: Secondary | ICD-10-CM

## 2020-02-11 DIAGNOSIS — F411 Generalized anxiety disorder: Secondary | ICD-10-CM

## 2020-02-11 DIAGNOSIS — F17210 Nicotine dependence, cigarettes, uncomplicated: Secondary | ICD-10-CM | POA: Diagnosis not present

## 2020-02-11 MED ORDER — BUPROPION HCL ER (SR) 150 MG PO TB12: 150.0000 mg | ORAL_TABLET | Freq: Two times a day (BID) | ORAL | 0 refills | Status: DC

## 2020-02-11 MED ORDER — QUETIAPINE FUMARATE 100 MG PO TABS: 150.0000 mg | ORAL_TABLET | Freq: Every day | ORAL | 1 refills | Status: DC

## 2020-02-11 NOTE — Progress Notes (Signed)
Subjective:    Patient ID: Audrey Campbell, female    DOB: 1955-05-10, 64 y.o.   MRN: [id number]  Audrey Campbell is a 64 y.o. female presenting on 02/11/2020 for Hypertension   HPI   Chronic pain Osteoarthritis / Multiple sites, low back and R knee Uses topical voltaren gel PRN Followed by [facility] in [place], on opioid Now off Clonazepam  Centrilobular Emphysema Chronic problem. Smoker. On maintenance therapy Breztri Failed Chantix She is ready to quit smoking, with med now  Recurrent Depression, Chronic / Anxiety / Insomnia Previously managed by prior PCP. Describes anxiety and panic history. Now has insomnia Some worse mood Past on Sertraline - Takes Seroquel nightlywith improvement - Off Clonazepam  Elevated BP Did not take amlodipine for past few days  Health Maintenance: Had abnormal cologuard, bowel prep for colonoscopy not clean will repeat   Depression screen [facility] 2/9 02/11/2020 11/09/2019 10/06/2019  Decreased Interest 1 2 0  Down, Depressed, Hopeless 1 1 0  PHQ - 2 Score 2 3 0  Altered sleeping 2 2 -  Tired, decreased energy 1 3 -  Change in appetite 1 1 -  Feeling bad or failure about yourself  1 1 -  Trouble concentrating 1 1 -  Moving slowly or fidgety/restless 1 1 -  Suicidal thoughts 0 0 -  PHQ-9 Score 9 12 -  Difficult doing work/chores Somewhat difficult Somewhat difficult -   GAD 7 : Generalized Anxiety Score 02/11/2020 11/09/2019 07/09/2019  Nervous, Anxious, on Edge 0 2 2  Control/stop worrying 1 3 3   Worry too much - different things 1 3 2   Trouble relaxing 1 2 2   Restless 0 2 1  Easily annoyed or irritable 1 3 3   Afraid - awful might happen 0 2 0  Total GAD 7 Score 4 17 13   Anxiety Difficulty Somewhat difficult Very difficult Very difficult     Social History   Tobacco Use  . Smoking status: Current Every Day Smoker    Packs/day: 0.50    Types: Cigarettes  . Smokeless tobacco: Never Used  Vaping Use  . Vaping  Use: Never used  Substance Use Topics  . Alcohol use: No  . Drug use: No    Review of Systems Per HPI unless specifically indicated above     Objective:    BP (!) 142/80 (BP Location: Left Arm, Cuff Size: Normal)   Pulse 77   Temp 97.6 F (36.4 C) (Temporal)   Resp 16   Ht 5\' 4"  (1.626 m)   Wt 187 lb (84.8 kg)   SpO2 97%   BMI 32.10 kg/m   Wt Readings from Last 3 Encounters:  02/11/20 187 lb (84.8 kg)  01/13/20 188 lb 9.6 oz (85.5 kg)  12/29/19 184 lb (83.5 kg)    Physical Exam    Results for orders placed or performed during the hospital encounter of 12/29/19  KOH prep   Specimen: Bronchial Brush  Result Value Ref Range   Specimen Description BRONCHIAL BRUSHING    Special Requests NONE    KOH Prep      NO YEAST OR FUNGAL ELEMENTS SEEN Performed at [facility], [address], [place], [place] [address]    Report Status 12/29/2019 FINAL   Surgical pathology  Result Value Ref Range   SURGICAL PATHOLOGY      SURGICAL PATHOLOGY CASE: [id number] PATIENT: [name] Surgical Pathology Report     Specimen Submitted: A. Stomach; cbx  Clinical  History: Abdominal pain R10.84 - Heartburn R12 - Positive cologuard R19.5 - Normal EGD, poor prep and diverticulosis.    DIAGNOSIS: A. STOMACH; COLD BIOPSY: - GASTRIC ANTRAL MUCOSA WITH MILD REACTIVE FOVEOLAR HYPERPLASIA. - GASTRIC OXYNTIC MUCOSA WITH NO SIGNIFICANT HISTOPATHOLOGIC CHANGE. - NEGATIVE FOR H. PYLORI, DYSPLASIA, AND MALIGNANCY.  GROSS DESCRIPTION: A. Labeled: Gastric cbx rule out H. pylori Received: Formalin Tissue fragment(s): Multiple Size: Aggregate, 1.2 x 0.5 x 0.2 cm Description: Tan soft tissue fragments Entirely submitted in 1 cassette.    Final Diagnosis performed by Allena Napoleon, MD.   Electronically signed 12/30/2019 9:21:58AM The electronic signature indicates that the named Attending Pathologist has evaluated the specimen Technical component performed at [facility],  [address] [place], [place] [address] Lab: [phone] Dir: Rush Farmer, MD, MMM  Professional component performed at [facility], [facility], [place], [place], [place] [address] Lab: [phone] Dir: Dellia Nims. Reuel Derby, MD       Assessment & Plan:   Problem List Items Addressed This Visit    Smoker    Ready to quit Unsuccessful chantix, recalled as well  Plan: 1. Start Wellbutrin SR 150mg  - start daily in AM for 3 days, then BID (8 hrs after 1st dose) avoid late afternoon/evening dosing to reduce insomnia / dry mouth. Given 1 month supply. Set quit date within 1-2 weeks, can continue smoking for now. Counseled on risks/benefits side effects of medication including mood disruptions and resulting behavior, advised to stop if any significant mood side effect.  F/u 3 months smoking  Discussion today >5 minutes (<10 minutes) specifically on counseling on risks of tobacco use, complications, treatment, smoking cessation.        Relevant Medications   buPROPion (WELLBUTRIN SR) 150 MG 12 hr tablet   Psychophysiological insomnia   Relevant Medications   QUEtiapine (SEROQUEL) 100 MG tablet   Major depressive disorder, recurrent, in partial remission ([place]) - Primary    Currently in partial remission Doing well on med management  Increase Seroquel 100 to 150 nightly new rx START Wellbutrin 150 BID for smoking and mood may continue       Relevant Medications   buPROPion (WELLBUTRIN SR) 150 MG 12 hr tablet   GAD (generalized anxiety disorder)    Chronic problem assoc with comorbid depression Previously on BDZ clonazepam from pain clinic, now they will not write rx further while on opioid, has come off BDZ  Increase Seroquel for insomnia and mood today START Wellbutrin for smoking and mood Decline rx Clonazepam at this time      Relevant Medications   buPROPion (WELLBUTRIN SR) 150 MG 12 hr tablet   Essential hypertension    Elevated BP, off  amlodipine for few days - Home BP readings reviewed  No known complications   Plan:  1. Restart Amlodipine 10mg  daily 2. Encourage improved lifestyle - low sodium diet, regular exercise 3. Monitor BP outside office, bring readings to next visit, if persistently >140/90 or new symptoms notify office sooner      Centrilobular emphysema ([place])    Chronic problem Active smoker, tobacco - ready to quit now Failed Symbicort Spiriva  Plan Continue Breztri [place] Albuterol PRN Future consider imaging CXR / LDCT for lung cancer screen and evaluation May refer to Pulmonology if not improving, may benefit from PFTs  Quit smoking       Other Visit Diagnoses    Cigarette nicotine dependence without complication       Relevant Medications   buPROPion [facility]  SR) 150 MG 12 hr tablet        Meds ordered this encounter  Medications  . QUEtiapine (SEROQUEL) 100 MG tablet    Sig: Take 1.5 tablets (150 mg total) by mouth at bedtime.    Dispense:  135 tablet    Refill:  1    Dose increase Seroquel  . buPROPion (WELLBUTRIN SR) 150 MG 12 hr tablet    Sig: Take 1 tablet (150 mg total) by mouth 2 (two) times daily.    Dispense:  180 tablet    Refill:  0    3 month only for smoking cessation      Follow up plan: Return in about 3 months (around 05/13/2020) for 3 month follow-up Smoking, Mood Insomnia PHQ.   Nobie Putnam, DO [facility] 02/11/2020, 2:56 PM

## 2020-02-11 NOTE — Patient Instructions (Addendum)
Thank you for coming to the office today.  Seroquel increased from 100 up to 150mg  daily, take one a half pills daily, new rx sent higher dose.  Stop Vitamin D3 50,000 weekly. May try a Multivitamin daily (WITH Vitamin D3 1,000 approximately) or a separate Vitamin D3 1,000 to 2,000 DAILY.  For your smoking cessation, here is the plan: - Start Wellbutrin 150mg  tablets - take 1 tab daily for 3 days - Then, start taking 1 tab TWICE daily (about every 8 hours - TRY NOT TO TAKE IT TOO LATE CAN KEEP YOU AWAKE. Continue this for about 7 weeks. - To quit smoking - wait to start this treatment until you are mentally ready to quit. - Choose a quit date in the future. Start taking the medicine about 1-2 weeks away from your quit date. Reduce the number of cigarettes daily until you eventually QUIT completely on your quit date. Continue taking the Wellbutrin twice daily for total 7 weeks, we may continue for longer.   Please schedule a Follow-up Appointment to: Return in about 3 months (around 05/13/2020) for 3 month follow-up Smoking, Mood Insomnia PHQ.  If you have any other questions or concerns, please feel free to call the office or send a message through [place]. You may also schedule an earlier appointment if necessary.  Additionally, you may be receiving a survey about your experience at our office within a few days to 1 week by e-mail or mail. We value your feedback.  Nobie Putnam, DO [place]

## 2020-02-12 NOTE — Assessment & Plan Note (Signed)
Currently in partial remission Doing well on med management  Increase Seroquel 100 to 150 nightly new rx START Wellbutrin 150 BID for smoking and mood may continue

## 2020-02-12 NOTE — Assessment & Plan Note (Signed)
Chronic problem Active smoker, tobacco - ready to quit now Failed Symbicort Spiriva  Plan Continue Breztri [place] Albuterol PRN Future consider imaging CXR / LDCT for lung cancer screen and evaluation May refer to Pulmonology if not improving, may benefit from PFTs  Quit smoking

## 2020-02-12 NOTE — Assessment & Plan Note (Signed)
Chronic problem assoc with comorbid depression Previously on BDZ clonazepam from pain clinic, now they will not write rx further while on opioid, has come off BDZ  Increase Seroquel for insomnia and mood today START Wellbutrin for smoking and mood Decline rx Clonazepam at this time

## 2020-02-12 NOTE — Assessment & Plan Note (Signed)
Elevated BP, off amlodipine for few days - Home BP readings reviewed  No known complications   Plan:  1. Restart Amlodipine 10mg  daily 2. Encourage improved lifestyle - low sodium diet, regular exercise 3. Monitor BP outside office, bring readings to next visit, if persistently >140/90 or new symptoms notify office sooner

## 2020-02-12 NOTE — Assessment & Plan Note (Signed)
Ready to quit Unsuccessful chantix, recalled as well  Plan: 1. Start Wellbutrin SR 150mg  - start daily in AM for 3 days, then BID (8 hrs after 1st dose) avoid late afternoon/evening dosing to reduce insomnia / dry mouth. Given 1 month supply. Set quit date within 1-2 weeks, can continue smoking for now. Counseled on risks/benefits side effects of medication including mood disruptions and resulting behavior, advised to stop if any significant mood side effect.  F/u 3 months smoking  Discussion today >5 minutes (<10 minutes) specifically on counseling on risks of tobacco use, complications, treatment, smoking cessation.

## 2020-02-17 ENCOUNTER — Other Ambulatory Visit: Payer: Self-pay | Admitting: Family Medicine

## 2020-02-17 DIAGNOSIS — J449 Chronic obstructive pulmonary disease, unspecified: Secondary | ICD-10-CM | POA: Diagnosis not present

## 2020-02-17 DIAGNOSIS — Z1231 Encounter for screening mammogram for malignant neoplasm of breast: Secondary | ICD-10-CM

## 2020-02-23 DIAGNOSIS — M25519 Pain in unspecified shoulder: Secondary | ICD-10-CM | POA: Diagnosis not present

## 2020-02-23 DIAGNOSIS — Z79891 Long term (current) use of opiate analgesic: Secondary | ICD-10-CM | POA: Diagnosis not present

## 2020-02-23 DIAGNOSIS — M25569 Pain in unspecified knee: Secondary | ICD-10-CM | POA: Diagnosis not present

## 2020-02-23 DIAGNOSIS — G894 Chronic pain syndrome: Secondary | ICD-10-CM | POA: Diagnosis not present

## 2020-02-23 DIAGNOSIS — M542 Cervicalgia: Secondary | ICD-10-CM | POA: Diagnosis not present

## 2020-03-01 ENCOUNTER — Telehealth (INDEPENDENT_AMBULATORY_CARE_PROVIDER_SITE_OTHER): Payer: Medicare Other | Admitting: Gastroenterology

## 2020-03-01 DIAGNOSIS — R195 Other fecal abnormalities: Secondary | ICD-10-CM

## 2020-03-01 DIAGNOSIS — K5900 Constipation, unspecified: Secondary | ICD-10-CM

## 2020-03-01 NOTE — Progress Notes (Signed)
Vonda Antigua, MD [address]  [place]  [place], [place] [address]  Main: [phone]  Fax: [phone]   Primary Care Physician: [name], DO  Virtual Visit via Telephone Note  I connected with patient on 03/01/20 at  3:00 PM EDT by telephone and verified that I am speaking with the correct person using two identifiers.   I discussed the limitations, risks, security and privacy concerns of performing an evaluation and management service by telephone and the availability of in person appointments. I also discussed with the patient that there may be a patient responsible charge related to this service. The patient expressed understanding and agreed to proceed.  Location of Patient: Home Location of Provider: Home Persons involved: Patient and provider only during the visit (nursing staff and front desk staff was involved in communicating with the patient prior to the appointment, reviewing medications and checking them in)   History of Present Illness: Chief complaint: Constipation  HPI: Audrey Campbell is a 64 y.o. female previously seen for positive Cologuard and underwent colonoscopy on December 29, 2019 with poor prep.  No large masses or lesions were identified.  However, recommendations were to repeat colonoscopy after improvement in constipation at this patient had poor prep on last exam.  Patient has been taking MiraLAX about twice a week and is now having a bowel movement every other day that is formed, compared to a bowel movement once a week associated with straining prior to that.  No abdominal pain.  No nausea or vomiting.  No blood in stool.  Current Outpatient Medications  Medication Sig Dispense Refill  . amLODipine (NORVASC) 10 MG tablet Take 1 tablet (10 mg total) by mouth daily. 90 tablet 1  . atorvastatin (LIPITOR) 40 MG tablet Take 1 tablet (40 mg total) by mouth daily. 90 tablet 1  . BREZTRI AEROSPHERE 160-9-4.8 MCG/ACT AERO  Inhale 2 puffs into the lungs in the morning and at bedtime. 10.7 g 2  . buPROPion (WELLBUTRIN SR) 150 MG 12 hr tablet Take 1 tablet (150 mg total) by mouth 2 (two) times daily. 180 tablet 0  . butalbital-acetaminophen-caffeine (FIORICET, ESGIC) 50-325-40 MG tablet Take 1 tablet by mouth every 6 (six) hours as needed for headache.   4  . gabapentin (NEURONTIN) 400 MG capsule Take 1 capsule (400 mg total) by mouth 4 (four) times daily. 360 capsule 1  . levothyroxine (SYNTHROID) 200 MCG tablet Take 1 tablet (200 mcg total) by mouth daily before breakfast. 90 tablet 1  . meloxicam (MOBIC) 15 MG tablet Take 1 tablet (15 mg total) by mouth daily as needed for pain. 90 tablet 1  . montelukast (SINGULAIR) 10 MG tablet Take 1 tablet (10 mg total) by mouth at bedtime. 90 tablet 1  . Na Sulfate-K Sulfate-Mg Sulf 17.5-3.13-1.6 GM/177ML SOLN At 5 PM two days before procedure take 2 bottles with ice cold water until it is all gone. Then at 5 PM the day before your procedure take 1 bottle with ice cold water until all gone and 5 hours before your procedure take 1 bottle until all gone. (Patient not taking: Reported on 02/11/2020) 708 mL 0  . oxycodone (OXY-IR) 5 MG capsule Take 5 mg by mouth every 4 (four) hours as needed.    . Polyethylene Glycol 3350 (MIRALAX PO) Take by mouth.    . QUEtiapine (SEROQUEL) 100 MG tablet Take 1.5 tablets (150 mg total) by mouth at bedtime. 135 tablet 1   No current  facility-administered medications for this visit.    Allergies as of 03/01/2020 - Review Complete 02/12/2020  Allergen Reaction Noted  . Penicillins Rash and Other (See Comments) 02/23/2013    Review of Systems:    All systems reviewed and negative except where noted in HPI.   Observations/Objective:  Labs: CMP     Component Value Date/Time   NA 136 10/06/2019 1002   K 4.6 10/06/2019 1002   CL 98 10/06/2019 1002   CO2 31 10/06/2019 1002   GLUCOSE 108 (H) 10/06/2019 1002   BUN 8 10/06/2019 1002    CREATININE 0.95 10/06/2019 1002   CALCIUM 9.6 10/06/2019 1002   PROT 7.0 10/06/2019 1002   AST 14 10/06/2019 1002   ALT 10 10/06/2019 1002   BILITOT 0.4 10/06/2019 1002   GFRNONAA 63 10/06/2019 1002   GFRAA 73 10/06/2019 1002   Lab Results  Component Value Date   WBC 6.9 10/06/2019   HGB 13.9 10/06/2019   HCT 43.1 10/06/2019   MCV 86.2 10/06/2019   PLT 226 10/06/2019    Imaging Studies: No results found.  Assessment and Plan:   Audrey Campbell is a 64 y.o. y/o female here for follow-up of constipation and positive Cologuard  Assessment and Plan: Constipation now improved with MiraLAX twice a week, high-fiber diet Patient advised to continue on above regimen If symptoms change patient advised to notify us and she verbalized understood  We discussed the need for repeat colonoscopy given her positive Cologuard and poor prep on last exam.  Patient does not want to schedule at this time but states she will call us back when she is ready to schedule.  We discussed the risk of underlying lesions given her positive Cologuard test and the risk of them growing into malignancy and she verbalized understanding.  She is willing to have another phone appointment with [place] in a month and if she has not already called [place] by then to schedule her repeat colonoscopy, she will consider scheduling it at that time  Follow Up Instructions:    I discussed the assessment and treatment plan with the patient. The patient was provided an opportunity to ask questions and all were answered. The patient agreed with the plan and demonstrated an understanding of the instructions.   The patient was advised to call back or seek an in-person evaluation if the symptoms worsen or if the condition fails to improve as anticipated.  I provided 12 minutes of non-face-to-face time during this encounter. Additional time was spent in reviewing patient's chart, placing orders etc.   Virgel Manifold, MD  Speech  recognition software was used to dictate this note.

## 2020-03-06 DIAGNOSIS — H5203 Hypermetropia, bilateral: Secondary | ICD-10-CM | POA: Diagnosis not present

## 2020-03-06 DIAGNOSIS — H35363 Drusen (degenerative) of macula, bilateral: Secondary | ICD-10-CM | POA: Diagnosis not present

## 2020-03-06 DIAGNOSIS — H2513 Age-related nuclear cataract, bilateral: Secondary | ICD-10-CM | POA: Diagnosis not present

## 2020-03-06 DIAGNOSIS — H52223 Regular astigmatism, bilateral: Secondary | ICD-10-CM | POA: Diagnosis not present

## 2020-03-06 DIAGNOSIS — H524 Presbyopia: Secondary | ICD-10-CM | POA: Diagnosis not present

## 2020-03-19 DIAGNOSIS — J449 Chronic obstructive pulmonary disease, unspecified: Secondary | ICD-10-CM | POA: Diagnosis not present

## 2020-03-27 ENCOUNTER — Other Ambulatory Visit: Payer: Self-pay

## 2020-03-27 ENCOUNTER — Ambulatory Visit
Admission: RE | Admit: 2020-03-27 | Discharge: 2020-03-27 | Disposition: A | Discharge status: Home / Self Care | Payer: Medicare Other | Source: Ambulatory Visit | Attending: Family Medicine | Admitting: Family Medicine

## 2020-03-27 DIAGNOSIS — G894 Chronic pain syndrome: Secondary | ICD-10-CM | POA: Diagnosis not present

## 2020-03-27 DIAGNOSIS — M25519 Pain in unspecified shoulder: Secondary | ICD-10-CM | POA: Diagnosis not present

## 2020-03-27 DIAGNOSIS — Z79891 Long term (current) use of opiate analgesic: Secondary | ICD-10-CM | POA: Diagnosis not present

## 2020-03-27 DIAGNOSIS — Z1231 Encounter for screening mammogram for malignant neoplasm of breast: Secondary | ICD-10-CM

## 2020-03-27 DIAGNOSIS — M25569 Pain in unspecified knee: Secondary | ICD-10-CM | POA: Diagnosis not present

## 2020-03-27 DIAGNOSIS — M542 Cervicalgia: Secondary | ICD-10-CM | POA: Diagnosis not present

## 2020-04-03 ENCOUNTER — Other Ambulatory Visit: Payer: Self-pay

## 2020-04-03 DIAGNOSIS — J441 Chronic obstructive pulmonary disease with (acute) exacerbation: Secondary | ICD-10-CM | POA: Diagnosis not present

## 2020-04-03 DIAGNOSIS — Z20822 Contact with and (suspected) exposure to covid-19: Secondary | ICD-10-CM | POA: Diagnosis not present

## 2020-04-03 DIAGNOSIS — Z8709 Personal history of other diseases of the respiratory system: Secondary | ICD-10-CM | POA: Diagnosis not present

## 2020-04-04 ENCOUNTER — Telehealth: Payer: Self-pay

## 2020-04-04 ENCOUNTER — Telehealth (INDEPENDENT_AMBULATORY_CARE_PROVIDER_SITE_OTHER): Payer: Medicare Other | Admitting: Gastroenterology

## 2020-04-04 ENCOUNTER — Encounter: Payer: Self-pay | Admitting: Gastroenterology

## 2020-04-04 DIAGNOSIS — K5900 Constipation, unspecified: Secondary | ICD-10-CM

## 2020-04-04 DIAGNOSIS — R195 Other fecal abnormalities: Secondary | ICD-10-CM

## 2020-04-04 NOTE — Progress Notes (Signed)
Vonda Antigua, MD [address]  [place]  [place], [place] [address]  Main: [phone]  Fax: [phone]   Primary Care Physician: [name], DO  Virtual Visit via Telephone Note  I connected with patient on 04/04/20 at  2:00 PM EST by telephone and verified that I am speaking with the correct person using two identifiers.   I discussed the limitations, risks, security and privacy concerns of performing an evaluation and management service by telephone and the availability of in person appointments. I also discussed with the patient that there may be a patient responsible charge related to this service. The patient expressed understanding and agreed to proceed.  Location of Patient: Home Location of Provider: Home Persons involved: Patient and provider only during the visit (nursing staff and front desk staff was involved in communicating with the patient prior to the appointment, reviewing medications and checking them in)   History of Present Illness: Chief Complaint  Patient presents with  . Constipation     HPI: COURTNAY PETRILLA is a 64 y.o. female here for follow-up of constipation.  Patient reports as long as she is taking her MiraLAX she will have a bowel movement every other day that is soft.  However, she does forget taking her MiraLAX frequently and that leads to days without a bowel movement and straining with stools.  I recommended that she keep her MiraLAX bottle around the counter so she does not forget taking it and she states she will start doing so.  No blood in stool.  She was previously Cologuard positive and colonoscopy in September 2021 had a poor prep.  Since then we have encouraged her to reschedule her colonoscopy but she has not done so yet.  Current Outpatient Medications  Medication Sig Dispense Refill  . amLODipine (NORVASC) 10 MG tablet Take 1 tablet (10 mg total) by mouth daily. 90 tablet 1  . atorvastatin (LIPITOR) 40 MG  tablet Take 1 tablet (40 mg total) by mouth daily. 90 tablet 1  . BREZTRI AEROSPHERE 160-9-4.8 MCG/ACT AERO Inhale 2 puffs into the lungs in the morning and at bedtime. 10.7 g 2  . buPROPion (WELLBUTRIN SR) 150 MG 12 hr tablet Take 1 tablet (150 mg total) by mouth 2 (two) times daily. 180 tablet 0  . butalbital-acetaminophen-caffeine (FIORICET, ESGIC) 50-325-40 MG tablet Take 1 tablet by mouth every 6 (six) hours as needed for headache.   4  . gabapentin (NEURONTIN) 400 MG capsule Take 1 capsule (400 mg total) by mouth 4 (four) times daily. 360 capsule 1  . hydrOXYzine (VISTARIL) 25 MG capsule Take 1 capsule by mouth daily.    Marland Kitchen levothyroxine (SYNTHROID) 200 MCG tablet Take 1 tablet (200 mcg total) by mouth daily before breakfast. 90 tablet 1  . meloxicam (MOBIC) 15 MG tablet Take 1 tablet (15 mg total) by mouth daily as needed for pain. 90 tablet 1  . meloxicam (MOBIC) 7.5 MG tablet Take 1 tablet by mouth daily.    . montelukast (SINGULAIR) 10 MG tablet Take 1 tablet (10 mg total) by mouth at bedtime. 90 tablet 1  . oxycodone (OXY-IR) 5 MG capsule Take 5 mg by mouth every 4 (four) hours as needed.    . Polyethylene Glycol 3350 (MIRALAX PO) Take by mouth.    . QUEtiapine (SEROQUEL) 100 MG tablet Take 1.5 tablets (150 mg total) by mouth at bedtime. 135 tablet 1   No current facility-administered medications for this visit.  Allergies as of 04/04/2020 - Review Complete 04/04/2020  Allergen Reaction Noted  . Penicillins Rash and Other (See Comments) 02/23/2013    Review of Systems:    All systems reviewed and negative except where noted in HPI.   Observations/Objective:  Labs: CMP     Component Value Date/Time   NA 136 10/06/2019 1002   K 4.6 10/06/2019 1002   CL 98 10/06/2019 1002   CO2 31 10/06/2019 1002   GLUCOSE 108 (H) 10/06/2019 1002   BUN 8 10/06/2019 1002   CREATININE 0.95 10/06/2019 1002   CALCIUM 9.6 10/06/2019 1002   PROT 7.0 10/06/2019 1002   AST 14 10/06/2019 1002     ALT 10 10/06/2019 1002   BILITOT 0.4 10/06/2019 1002   GFRNONAA 63 10/06/2019 1002   GFRAA 73 10/06/2019 1002   Lab Results  Component Value Date   WBC 6.9 10/06/2019   HGB 13.9 10/06/2019   HCT 43.1 10/06/2019   MCV 86.2 10/06/2019   PLT 226 10/06/2019    Imaging Studies: MM 3D SCREEN BREAST BILATERAL  Result Date: 03/28/2020 CLINICAL DATA:  Screening. EXAM: DIGITAL SCREENING BILATERAL MAMMOGRAM WITH TOMO AND CAD COMPARISON:  Previous exam(s). ACR Breast Density Category c: The breast tissue is heterogeneously dense, which may obscure small masses. FINDINGS: There are no findings suspicious for malignancy. Images were processed with CAD. IMPRESSION: No mammographic evidence of malignancy. A result letter of this screening mammogram will be mailed directly to the patient. RECOMMENDATION: Screening mammogram in one year. (Code:[id number]-B-01Y) BI-RADS CATEGORY  1: Negative. Electronically Signed   By: Kristopher Oppenheim M.D.   On: 03/28/2020 13:48    Assessment and Plan:   DETRICE CALES is a 64 y.o. y/o female here for follow-up of constipation  Assessment and Plan: Constipation responds well to MiraLAX I have encouraged her to try not forget her MiraLAX dosing once a day as it works well for her and she verbalized understanding High-fiber diet encouraged  Patient is willing to schedule her repeat colonoscopy, but would like to wait till January.  Importance of scheduling the procedure and risk of underlying lesions or malignancy especially given her positive Cologuard discussed in detail and she verbalized understanding.  Our staff will call her and schedule her for January  Follow Up Instructions:    I discussed the assessment and treatment plan with the patient. The patient was provided an opportunity to ask questions and all were answered. The patient agreed with the plan and demonstrated an understanding of the instructions.   The patient was advised to call back or seek an  in-person evaluation if the symptoms worsen or if the condition fails to improve as anticipated.  I provided 12 minutes of non-face-to-face time during this encounter. Additional time was spent in reviewing patient's chart, placing orders etc.   Virgel Manifold, MD  Speech recognition software was used to dictate this note.

## 2020-04-04 NOTE — Telephone Encounter (Signed)
Called patient and left her a voicemail to call me back and schedule her colonoscopy for January 2022. Colonoscopy Diagnosis: positive cologuard test R19.5

## 2020-04-06 ENCOUNTER — Other Ambulatory Visit: Payer: Self-pay

## 2020-04-06 ENCOUNTER — Ambulatory Visit (INDEPENDENT_AMBULATORY_CARE_PROVIDER_SITE_OTHER): Payer: Medicare Other | Admitting: Family Medicine

## 2020-04-06 ENCOUNTER — Encounter: Payer: Self-pay | Admitting: Family Medicine

## 2020-04-06 VITALS — BP 155/92 | HR 88 | Ht 64.0 in | Wt 181.0 lb

## 2020-04-06 DIAGNOSIS — J432 Centrilobular emphysema: Secondary | ICD-10-CM

## 2020-04-06 DIAGNOSIS — I10 Essential (primary) hypertension: Secondary | ICD-10-CM | POA: Diagnosis not present

## 2020-04-06 DIAGNOSIS — H6982 Other specified disorders of Eustachian tube, left ear: Secondary | ICD-10-CM | POA: Diagnosis not present

## 2020-04-06 MED ORDER — FLUTICASONE PROPIONATE 50 MCG/ACT NA SUSP: 2.0000 | Freq: Every day | NASAL | 3 refills | Status: DC

## 2020-04-06 MED ORDER — AMLODIPINE BESYLATE 10 MG PO TABS: 10.0000 mg | ORAL_TABLET | Freq: Every day | ORAL | 1 refills | Status: DC

## 2020-04-06 NOTE — Patient Instructions (Addendum)
Thank you for coming to the office today.  Finish Doxycycline course   Start nasal steroid Flonase 2 sprays in each nostril daily for 4-6 weeks, may repeat course seasonally or as needed For fluid in ear.  If not improved or returns - contact us back, may add prednisone for breathing  If not ready to quit smoking -reschedule next apt  Please schedule a Follow-up Appointment to: Return if symptoms worsen or fail to improve, for may push back next apt if not ready to quit smoking.  If you have any other questions or concerns, please feel free to call the office or send a message through [facility]. You may also schedule an earlier appointment if necessary.  Additionally, you may be receiving a survey about your experience at our office within a few days to 1 week by e-mail or mail. We value your feedback.  Nobie Putnam, DO [place]

## 2020-04-06 NOTE — Progress Notes (Signed)
Subjective:    Patient ID: Audrey Campbell, female    DOB: 1955/10/16, 64 y.o.   MRN: [id number]  Audrey Campbell is a 64 y.o. female presenting on 04/06/2020 for Hypertension (Follow up.), COPD (Follow up.), and Ear Problem (Left ear popping and stopped up for 1 week. On doxycycline for chest infection from UC- [facility]. )   HPI  Centrilobular Emphysema, COPD exac Eustachian tube dysfunction Recent visit to urgent care for productive cough and had sinus congestion, also had Left ear fullness. She was treated with Doxycycline from urgent care on 04/03/20, with improvement so far, now no longer having productive cough. - She uses Breztri 2 puff BID, has Albuterol PRN - she has financial support on Breztri - Admits sinus congestion / Left ear pressure and fullness has to pop it - Denies any fevers, chest pain, dyspnea headache  CHRONIC HTN: Reports elevated BP did not take med today or recently Current Meds - Amlodipine 10mg  daily needs re order   Reports good compliance, took meds today. Tolerating well, w/o complaints. Denies CP, dyspnea, HA, edema, dizziness / lightheadedness   Depression screen [facility] 2/9 04/06/2020 04/06/2020 02/11/2020  Decreased Interest 0 0 1  Down, Depressed, Hopeless 0 0 1  PHQ - 2 Score 0 0 2  Altered sleeping - - 2  Tired, decreased energy - - 1  Change in appetite - - 1  Feeling bad or failure about yourself  - - 1  Trouble concentrating - - 1  Moving slowly or fidgety/restless - - 1  Suicidal thoughts - - 0  PHQ-9 Score - - 9  Difficult doing work/chores - - Somewhat difficult    Social History   Tobacco Use  . Smoking status: Current Every Day Smoker    Packs/day: 0.50    Types: Cigarettes  . Smokeless tobacco: Never Used  Vaping Use  . Vaping Use: Never used  Substance Use Topics  . Alcohol use: No  . Drug use: No    Review of Systems Per HPI unless specifically indicated above     Objective:    BP (!)  155/92   Pulse 88   Ht 5\' 4"  (1.626 m)   Wt 181 lb (82.1 kg)   SpO2 95%   BMI 31.07 kg/m   Wt Readings from Last 3 Encounters:  04/06/20 181 lb (82.1 kg)  02/11/20 187 lb (84.8 kg)  01/13/20 188 lb 9.6 oz (85.5 kg)    Physical Exam Vitals and nursing note reviewed.  Constitutional:      General: She is not in acute distress.    Appearance: She is well-developed and well-nourished. She is not diaphoretic.     Comments: Well-appearing, comfortable, cooperative  HENT:     Head: Normocephalic and atraumatic.     Right Ear: Ear canal and external ear normal. There is no impacted cerumen.     Left Ear: Ear canal and external ear normal. There is no impacted cerumen.     Ears:     Comments: Bilateral TM with some fullness clear fluid bulging, L side has some erythema    Mouth/Throat:     Mouth: Oropharynx is clear and moist.  Eyes:     General:        Right eye: No discharge.        Left eye: No discharge.     Conjunctiva/sclera: Conjunctivae normal.  Neck:     Thyroid: No thyromegaly.  Cardiovascular:  Rate and Rhythm: Normal rate and regular rhythm.     Pulses: Intact distal pulses.     Heart sounds: Normal heart sounds. No murmur heard.   Pulmonary:     Effort: Pulmonary effort is normal. No respiratory distress.     Breath sounds: Normal breath sounds. No wheezing or rales.     Comments: Good air movement Musculoskeletal:        General: No edema. Normal range of motion.     Cervical back: Normal range of motion and neck supple.  Lymphadenopathy:     Cervical: No cervical adenopathy.  Skin:    General: Skin is warm and dry.     Findings: No erythema or rash.  Neurological:     Mental Status: She is alert and oriented to person, place, and time.  Psychiatric:        Mood and Affect: Mood and affect normal.        Behavior: Behavior normal.     Comments: Well groomed, good eye contact, normal speech and thoughts       Results for orders placed or performed  during the hospital encounter of 12/29/19  KOH prep   Specimen: Bronchial Brush  Result Value Ref Range   Specimen Description BRONCHIAL BRUSHING    Special Requests NONE    KOH Prep      NO YEAST OR FUNGAL ELEMENTS SEEN Performed at [facility], [address], [place], [place] [address]    Report Status 12/29/2019 FINAL   Surgical pathology  Result Value Ref Range   SURGICAL PATHOLOGY      SURGICAL PATHOLOGY CASE: [id number] PATIENT: [name] Surgical Pathology Report     Specimen Submitted: A. Stomach; cbx  Clinical History: Abdominal pain R10.84 - Heartburn R12 - Positive cologuard R19.5 - Normal EGD, poor prep and diverticulosis.    DIAGNOSIS: A. STOMACH; COLD BIOPSY: - GASTRIC ANTRAL MUCOSA WITH MILD REACTIVE FOVEOLAR HYPERPLASIA. - GASTRIC OXYNTIC MUCOSA WITH NO SIGNIFICANT HISTOPATHOLOGIC CHANGE. - NEGATIVE FOR H. PYLORI, DYSPLASIA, AND MALIGNANCY.  GROSS DESCRIPTION: A. Labeled: Gastric cbx rule out H. pylori Received: Formalin Tissue fragment(s): Multiple Size: Aggregate, 1.2 x 0.5 x 0.2 cm Description: Tan soft tissue fragments Entirely submitted in 1 cassette.    Final Diagnosis performed by Allena Napoleon, MD.   Electronically signed 12/30/2019 9:21:58AM The electronic signature indicates that the named Attending Pathologist has evaluated the specimen Technical component performed at [facility], [address] [place], [place] [address] Lab: [phone] Dir: Rush Farmer, MD, MMM  Professional component performed at [facility], [facility], [place], [place], [place] [address] Lab: [phone] Dir: Dellia Nims. Rubinas, MD       Assessment & Plan:   Problem List Items Addressed This Visit    Essential hypertension   Relevant Medications   amLODipine (NORVASC) 10 MG tablet   Centrilobular emphysema (HCC) - Primary   Relevant Medications   fluticasone (FLONASE) 50 MCG/ACT nasal spray    Other Visit  Diagnoses    Acute dysfunction of left eustachian tube       Relevant Medications   fluticasone (FLONASE) 50 MCG/ACT nasal spray      #COPD, possible recent acute exac Improving s/p recent course doxycycline by Urgent Care Afebrile, no focal lung abnormality On Breztri maintenance therapy, Albuterol PRN  #Acute sinusitis vs Eustachian tube dysfunction L>R Clinically secondary to sinusitis congestion, seems to have persistent fluid L ear Maybe early developing AOM but already on antibiotics Start nasal steroid  Flonase 2 sprays in each nostril daily for 4-6 weeks, may repeat course seasonally or as needed   #HTN Elevated BP today, off medication Amlodipine - needs to restart Re order.  Meds ordered this encounter  Medications  . fluticasone (FLONASE) 50 MCG/ACT nasal spray    Sig: Place 2 sprays into both nostrils daily. Use for 4-6 weeks then stop and use seasonally or as needed.    Dispense:  16 g    Refill:  3  . amLODipine (NORVASC) 10 MG tablet    Sig: Take 1 tablet (10 mg total) by mouth daily.    Dispense:  90 tablet    Refill:  1      Follow up plan: Return if symptoms worsen or fail to improve, for may push back next apt if not ready to quit smoking.   Nobie Putnam, DO [facility] 04/06/2020, 11:38 AM

## 2020-04-07 ENCOUNTER — Other Ambulatory Visit: Payer: Self-pay

## 2020-04-07 DIAGNOSIS — R195 Other fecal abnormalities: Secondary | ICD-10-CM

## 2020-04-07 MED ORDER — NA SULFATE-K SULFATE-MG SULF 17.5-3.13-1.6 GM/177ML PO SOLN: ORAL | 0 refills | Status: DC

## 2020-04-07 NOTE — Telephone Encounter (Signed)
Patient called back and she agreed on having her colonoscopy done on 05/18/2020 at [facility]. Patient was reminded that she will have to do a 2-day prep and she was fine.

## 2020-04-18 DIAGNOSIS — J449 Chronic obstructive pulmonary disease, unspecified: Secondary | ICD-10-CM | POA: Diagnosis not present

## 2020-04-25 ENCOUNTER — Telehealth: Payer: Self-pay | Admitting: Family Medicine

## 2020-04-25 DIAGNOSIS — J432 Centrilobular emphysema: Secondary | ICD-10-CM

## 2020-04-25 MED ORDER — BREZTRI AEROSPHERE 160-9-4.8 MCG/ACT IN AERO: 2.0000 | INHALATION_SPRAY | Freq: Two times a day (BID) | RESPIRATORY_TRACT | 2 refills | Status: DC

## 2020-04-25 NOTE — Telephone Encounter (Signed)
Pt calling to get a   Refill of the BREZTRI AEROSPHERE 160-9-4.8 MCG/ACT AERO  Program.  They told her they would help pay for her inhaler Breztri if the doctor would fax the rx to [phone]   Pt ID #  [id number]  Can fax to the number   [employer]  [phone]

## 2020-04-25 NOTE — Telephone Encounter (Signed)
Printed new rx to be faxed.  Saralyn Pilar, DO [facility] 04/25/2020, 2:36 PM

## 2020-05-02 ENCOUNTER — Other Ambulatory Visit: Payer: Self-pay | Admitting: Family Medicine

## 2020-05-02 DIAGNOSIS — M159 Polyosteoarthritis, unspecified: Secondary | ICD-10-CM

## 2020-05-02 DIAGNOSIS — M545 Low back pain, unspecified: Secondary | ICD-10-CM

## 2020-05-02 DIAGNOSIS — G8929 Other chronic pain: Secondary | ICD-10-CM

## 2020-05-02 DIAGNOSIS — G894 Chronic pain syndrome: Secondary | ICD-10-CM | POA: Diagnosis not present

## 2020-05-02 DIAGNOSIS — Z79891 Long term (current) use of opiate analgesic: Secondary | ICD-10-CM | POA: Diagnosis not present

## 2020-05-02 DIAGNOSIS — Z79899 Other long term (current) drug therapy: Secondary | ICD-10-CM | POA: Diagnosis not present

## 2020-05-02 DIAGNOSIS — M8949 Other hypertrophic osteoarthropathy, multiple sites: Secondary | ICD-10-CM

## 2020-05-02 DIAGNOSIS — M25519 Pain in unspecified shoulder: Secondary | ICD-10-CM | POA: Diagnosis not present

## 2020-05-02 DIAGNOSIS — M542 Cervicalgia: Secondary | ICD-10-CM | POA: Diagnosis not present

## 2020-05-02 DIAGNOSIS — M722 Plantar fascial fibromatosis: Secondary | ICD-10-CM | POA: Diagnosis not present

## 2020-05-02 DIAGNOSIS — M25569 Pain in unspecified knee: Secondary | ICD-10-CM | POA: Diagnosis not present

## 2020-05-02 NOTE — Telephone Encounter (Signed)
Requested medication (s) are due for refill today: Yes  Requested medication (s) are on the active medication list: Yes  Last refill:  04/03/20  Future visit scheduled: Yes  Notes to clinic:  Unable to refill per protocol, last refilled by historical provider     Requested Prescriptions  Pending Prescriptions Disp Refills   meloxicam (MOBIC) 7.5 MG tablet [Pharmacy Med Name: MELOXICAM 7.5 MG TAB] 60 tablet     Sig: TAKE 1 TABLET BY MOUTH TWICE A DAY      Analgesics:  COX2 Inhibitors Passed - 05/02/2020 12:55 PM      Passed - HGB in normal range and within 360 days    Hemoglobin  Date Value Ref Range Status  10/06/2019 13.9 11.7 - 15.5 g/dL Final          Passed - Cr in normal range and within 360 days    Creat  Date Value Ref Range Status  10/06/2019 0.95 0.50 - 0.99 mg/dL Final    Comment:    For patients >35 years of age, the reference limit for Creatinine is approximately 13% higher for people identified as African-American. Verna Czech - Patient is not pregnant      Passed - Valid encounter within last 12 months    Recent Outpatient Visits           3 weeks ago Centrilobular emphysema (HCC)   [facility], Netta Neat, DO   2 months ago Major depressive disorder, recurrent, in partial remission [facility])   [facility], Netta Neat, DO   5 months ago Major depressive disorder, recurrent, in partial remission [facility])   [facility] Smitty Cords, DO   6 months ago Centrilobular emphysema [facility])   [facility] Smitty Cords, DO   9 months ago Major depression, recurrent, chronic [facility])   [facility] Althea Charon, Netta Neat, DO       Future Appointments             In 1 week Althea Charon, Netta Neat, DO [facility], [facility]

## 2020-05-03 MED ORDER — MELOXICAM 15 MG PO TABS: 15.0000 mg | ORAL_TABLET | Freq: Every day | ORAL | 1 refills | Status: DC | PRN

## 2020-05-03 NOTE — Telephone Encounter (Signed)
Can you clarify with patient if she needs Meloxicam 15mg  once daily PRN or if it is the requested Meloxicam 7.5mg  one pill TWICE daily PRN?  , DO [facility] 05/03/2020, 11:32 AM

## 2020-05-03 NOTE — Telephone Encounter (Signed)
Patient likes Meloxicam 15mg  once daily PRN send to her pharmacy.

## 2020-05-15 ENCOUNTER — Ambulatory Visit: Payer: Medicare Other | Admitting: Family Medicine

## 2020-05-16 ENCOUNTER — Telehealth: Payer: Self-pay | Admitting: Gastroenterology

## 2020-05-16 ENCOUNTER — Other Ambulatory Visit: Admission: RE | Admit: 2020-05-16 | Payer: Medicare Other | Source: Ambulatory Visit

## 2020-05-16 NOTE — Telephone Encounter (Signed)
Patient left voicemail to cancel colonoscopy with Dr. Bonna Gains on [place] 1.20.22 and states she will cb to resch. Pt requesting a call to notify appt cancellation. Pt had virtual ov on 12.7.22.

## 2020-05-17 ENCOUNTER — Ambulatory Visit: Payer: Medicare Other | Admitting: Family Medicine

## 2020-05-18 ENCOUNTER — Encounter: Admission: RE | Payer: Self-pay | Source: Home / Self Care

## 2020-05-18 ENCOUNTER — Ambulatory Visit: Admission: RE | Admit: 2020-05-18 | Payer: Medicare Other | Source: Home / Self Care | Admitting: Gastroenterology

## 2020-05-18 SURGERY — COLONOSCOPY WITH PROPOFOL
Anesthesia: General

## 2020-05-19 DIAGNOSIS — J449 Chronic obstructive pulmonary disease, unspecified: Secondary | ICD-10-CM | POA: Diagnosis not present

## 2020-05-19 MED ORDER — BREZTRI AEROSPHERE 160-9-4.8 MCG/ACT IN AERO
2.0000 | INHALATION_SPRAY | Freq: Two times a day (BID) | RESPIRATORY_TRACT | 2 refills | Status: DC
Start: 2020-05-19 — End: 2020-06-06

## 2020-05-19 NOTE — Telephone Encounter (Addendum)
Patient checking on the status of message below and would like  BREZTRI AEROSPHERE 160-9-4.8 MCG/ACT AERO  re faxed again to [employer] program. Patient would like a follow up call when completed.

## 2020-05-19 NOTE — Telephone Encounter (Signed)
The prescription has been ordered, signed, and faxed. Patient has been notified

## 2020-05-19 NOTE — Addendum Note (Signed)
Addended by: [name] on: 05/19/2020 12:39 PM   Modules accepted: Orders

## 2020-05-19 NOTE — Telephone Encounter (Signed)
Re printed the rx.  I am not sure what she is asking for other than [place] to re-fax it.  Nobie Putnam, [facility] 05/19/2020, 12:39 PM

## 2020-05-22 ENCOUNTER — Ambulatory Visit (INDEPENDENT_AMBULATORY_CARE_PROVIDER_SITE_OTHER): Payer: Medicare Other | Admitting: Family Medicine

## 2020-05-22 ENCOUNTER — Other Ambulatory Visit: Payer: Self-pay

## 2020-05-22 ENCOUNTER — Other Ambulatory Visit: Payer: Self-pay | Admitting: Family Medicine

## 2020-05-22 ENCOUNTER — Encounter: Payer: Self-pay | Admitting: Family Medicine

## 2020-05-22 VITALS — BP 119/71 | HR 82 | Ht 64.0 in | Wt 182.2 lb

## 2020-05-22 DIAGNOSIS — J432 Centrilobular emphysema: Secondary | ICD-10-CM

## 2020-05-22 DIAGNOSIS — E039 Hypothyroidism, unspecified: Secondary | ICD-10-CM

## 2020-05-22 DIAGNOSIS — F3341 Major depressive disorder, recurrent, in partial remission: Secondary | ICD-10-CM

## 2020-05-22 DIAGNOSIS — Z0000 Encounter for general adult medical examination without abnormal findings: Secondary | ICD-10-CM

## 2020-05-22 DIAGNOSIS — F5104 Psychophysiologic insomnia: Secondary | ICD-10-CM

## 2020-05-22 DIAGNOSIS — R7309 Other abnormal glucose: Secondary | ICD-10-CM

## 2020-05-22 DIAGNOSIS — J302 Other seasonal allergic rhinitis: Secondary | ICD-10-CM

## 2020-05-22 DIAGNOSIS — I10 Essential (primary) hypertension: Secondary | ICD-10-CM

## 2020-05-22 MED ORDER — MONTELUKAST SODIUM 10 MG PO TABS: 10.0000 mg | ORAL_TABLET | Freq: Every day | ORAL | 1 refills | Status: DC

## 2020-05-22 NOTE — Patient Instructions (Addendum)
Thank you for coming to the office today.  Refilled Montelukast  Faxed Breztri order on Fri 1/21 - stay tuned, let us know if it does not work, we can always call our clinical pharmacist Grayland Ormond (works for [employer]) we can help arrange this.  DUE for FASTING BLOOD WORK (no food or drink after midnight before the lab appointment, only water or coffee without cream/sugar on the morning of)  SCHEDULE "Lab Only" visit in the morning at the clinic for lab draw in 5 MONTHS    - Make sure Lab Only appointment is at about 1 week before your next appointment, so that results will be available  For Lab Results, once available within 2-3 days of blood draw, you can can log in to MyChart online to view your results and a brief explanation. Also, we can discuss results at next follow-up visit.  Please schedule a Follow-up Appointment to: Return in about 5 months (around 10/20/2020) for 5 month fasting lab then 1 week later Annual Physical.  If you have any other questions or concerns, please feel free to call the office or send a message through [place]. You may also schedule an earlier appointment if necessary.  Additionally, you may be receiving a survey about your experience at our office within a few days to 1 week by e-mail or mail. We value your feedback.  Nobie Putnam, DO [place]

## 2020-05-22 NOTE — Assessment & Plan Note (Signed)
Chronic problem Active smoker, tobacco - ready to quit now Failed Symbicort Spiriva  Plan Continue Audrey Campbell - confirmed we faxed in her rx to [employer] program on Fri 1/21 - if they still do not receive it we can refer to [place] for med assistance PAP program [place] Albuterol PRN Future consider imaging CXR / LDCT for lung cancer screen and evaluation May refer to Pulmonology if not improving, may benefit from PFTs  Quit smoking

## 2020-05-22 NOTE — Progress Notes (Signed)
Subjective:    Patient ID: Audrey Campbell, female    DOB: [birth year], 65 y.o.   MRN: [id number]  Audrey Campbell is a 65 y.o. female presenting on 05/22/2020 for 3 month follow up, Hypertension, and COPD   HPI   Centrilobular Emphysema (COPD) Left ear eustachian tube dysfunction - improved She gets breztri from [employer] for med assist breztri, was faxed from our office on Friday 1/21, 2nd time, she has not received it. - She uses Breztri 2 puff BID, has Albuterol PRN - she has financial support on Breztri - On Montelukast for allergies needs refills - She used Flonase regularly since last visit, now significant improved, L ear has improved less fluid. - Denies any fevers, chest pain, dyspnea headache  CHRONIC HTN: Doing well normal BP on med. Current Meds - Amlodipine 10mg  daily Reports good compliance, took meds today. Tolerating well, w/o complaints. Denies CP, dyspnea, HA, edema, dizziness / lightheadedness   Health Maintenance: Re-scheduled her colonoscopy to February 2022  Depression screen [facility] 2/9 05/22/2020 04/06/2020 04/06/2020  Decreased Interest 1 0 0  Down, Depressed, Hopeless 1 0 0  PHQ - 2 Score 2 0 0  Altered sleeping 0 - -  Tired, decreased energy 1 - -  Change in appetite 0 - -  Feeling bad or failure about yourself  0 - -  Trouble concentrating 0 - -  Moving slowly or fidgety/restless 0 - -  Suicidal thoughts 0 - -  PHQ-9 Score 3 - -  Difficult doing work/chores Not difficult at all - -   GAD 7 : Generalized Anxiety Score 02/11/2020 11/09/2019 07/09/2019  Nervous, Anxious, on Edge 0 2 2  Control/stop worrying 1 3 3   Worry too much - different things 1 3 2   Trouble relaxing 1 2 2   Restless 0 2 1  Easily annoyed or irritable 1 3 3   Afraid - awful might happen 0 2 0  Total GAD 7 Score 4 17 13   Anxiety Difficulty Somewhat difficult Very difficult Very difficult     Social History   Tobacco Use  . Smoking status: Current Every Day Smoker     Packs/day: 0.50    Types: Cigarettes  . Smokeless tobacco: Never Used  Vaping Use  . Vaping Use: Never used  Substance Use Topics  . Alcohol use: No  . Drug use: No    Review of Systems Per HPI unless specifically indicated above     Objective:    BP 119/71   Pulse 82   Ht 5\' 4"  (1.626 m)   Wt 182 lb 3.2 oz (82.6 kg)   SpO2 98%   BMI 31.27 kg/m   Wt Readings from Last 3 Encounters:  05/22/20 182 lb 3.2 oz (82.6 kg)  04/06/20 181 lb (82.1 kg)  02/11/20 187 lb (84.8 kg)    Physical Exam Vitals and nursing note reviewed.  Constitutional:      General: She is not in acute distress.    Appearance: She is well-developed and well-nourished. She is not diaphoretic.     Comments: Well-appearing, comfortable, cooperative  HENT:     Head: Normocephalic and atraumatic.     Right Ear: Tympanic membrane, ear canal and external ear normal.     Left Ear: Ear canal and external ear normal.     Ears:     Comments: Mild clear effusion still on L ear TM but improved    Mouth/Throat:     Mouth: Oropharynx is clear and  moist.  Eyes:     General:        Right eye: No discharge.        Left eye: No discharge.     Conjunctiva/sclera: Conjunctivae normal.  Neck:     Thyroid: No thyromegaly.  Cardiovascular:     Rate and Rhythm: Normal rate and regular rhythm.     Pulses: Intact distal pulses.     Heart sounds: Normal heart sounds. No murmur heard.   Pulmonary:     Effort: Pulmonary effort is normal. No respiratory distress.     Breath sounds: Wheezing (mild end expiratory wheezing bilateral) present. No rales.     Comments: Good air movement Musculoskeletal:        General: No edema. Normal range of motion.     Cervical back: Normal range of motion and neck supple.     Right lower leg: No edema.     Left lower leg: No edema.  Lymphadenopathy:     Cervical: No cervical adenopathy.  Skin:    General: Skin is warm and dry.     Findings: No erythema or rash.  Neurological:      Mental Status: She is alert and oriented to person, place, and time.  Psychiatric:        Mood and Affect: Mood and affect normal.        Behavior: Behavior normal.     Comments: Well groomed, good eye contact, normal speech and thoughts    Results for orders placed or performed during the hospital encounter of 12/29/19  KOH prep   Specimen: Bronchial Brush  Result Value Ref Range   Specimen Description BRONCHIAL BRUSHING    Special Requests NONE    KOH Prep      NO YEAST OR FUNGAL ELEMENTS SEEN Performed at [facility], [address] [place], [place] [address]    Report Status 12/29/2019 FINAL   Surgical pathology  Result Value Ref Range   SURGICAL PATHOLOGY      SURGICAL PATHOLOGY CASE: [id number] PATIENT: Audrey Campbell Surgical Pathology Report     Specimen Submitted: A. Stomach; cbx  Clinical History: Abdominal pain R10.84 - Heartburn R12 - Positive cologuard R19.5 - Normal EGD, poor prep and diverticulosis.    DIAGNOSIS: A. STOMACH; COLD BIOPSY: - GASTRIC ANTRAL MUCOSA WITH MILD REACTIVE FOVEOLAR HYPERPLASIA. - GASTRIC OXYNTIC MUCOSA WITH NO SIGNIFICANT HISTOPATHOLOGIC CHANGE. - NEGATIVE FOR H. PYLORI, DYSPLASIA, AND MALIGNANCY.  GROSS DESCRIPTION: A. Labeled: Gastric cbx rule out H. pylori Received: Formalin Tissue fragment(s): Multiple Size: Aggregate, 1.2 x 0.5 x 0.2 cm Description: Tan soft tissue fragments Entirely submitted in 1 cassette.    Final Diagnosis performed by Allena Napoleon, MD.   Electronically signed 12/30/2019 9:21:58AM The electronic signature indicates that the named Attending Pathologist has evaluated the specimen Technical component performed at [facility], [address] [place], [place] [address] Lab: [phone] Dir: Rush Farmer, MD, MMM  Professional component performed at [facility], [facility], [place], [place],  [address] Lab: [phone] Dir: Dellia Nims. Rubinas, MD        Assessment & Plan:   Problem List Items Addressed This Visit    Centrilobular emphysema ([place]) - Primary    Chronic problem Active smoker, tobacco - ready to quit now Failed Symbicort Spiriva  Plan Continue Judithann Sauger - confirmed we faxed in her rx to [employer] program on Fri 1/21 - if they still do not receive it we can refer to [place] for med  assistance PAP program [place] Albuterol PRN Future consider imaging CXR / LDCT for lung cancer screen and evaluation May refer to Pulmonology if not improving, may benefit from PFTs  Quit smoking      Relevant Medications   montelukast (SINGULAIR) 10 MG tablet    Other Visit Diagnoses    Seasonal allergies       Relevant Medications   montelukast (SINGULAIR) 10 MG tablet      Meds ordered this encounter  Medications  . montelukast (SINGULAIR) 10 MG tablet    Sig: Take 1 tablet (10 mg total) by mouth daily.    Dispense:  90 tablet    Refill:  1      Follow up plan: Return in about 5 months (around 10/20/2020) for 5 month fasting lab then 1 week later Annual Physical.  Future labs ordered for 10/16/20   Nobie Putnam, [facility] 05/22/2020, 1:45 PM

## 2020-05-27 ENCOUNTER — Other Ambulatory Visit: Payer: Self-pay | Admitting: Family Medicine

## 2020-05-27 DIAGNOSIS — J432 Centrilobular emphysema: Secondary | ICD-10-CM

## 2020-05-29 ENCOUNTER — Other Ambulatory Visit: Payer: Self-pay | Admitting: Family Medicine

## 2020-05-29 DIAGNOSIS — J432 Centrilobular emphysema: Secondary | ICD-10-CM

## 2020-05-29 NOTE — Telephone Encounter (Signed)
Copied from [facility] [id number]. Topic: Quick Communication - Rx Refill/Question >> May 29, 2020  3:22 PM Tessa Lerner A wrote: Medication: BREZTRI AEROSPHERE 160-9-4.8 MCG/ACT AERO   Has the patient contacted their pharmacy? Yes. Patient was directed, by pharmacy, to contact PCP  Preferred Pharmacy (with phone number or street name): [place], [place] Phone: [phone]  Agent: Please be advised that RX refills may take up to 3 business days. We ask that you follow-up with your pharmacy.

## 2020-05-30 DIAGNOSIS — Z79891 Long term (current) use of opiate analgesic: Secondary | ICD-10-CM | POA: Diagnosis not present

## 2020-05-30 DIAGNOSIS — M25519 Pain in unspecified shoulder: Secondary | ICD-10-CM | POA: Diagnosis not present

## 2020-05-30 DIAGNOSIS — M542 Cervicalgia: Secondary | ICD-10-CM | POA: Diagnosis not present

## 2020-05-30 DIAGNOSIS — M722 Plantar fascial fibromatosis: Secondary | ICD-10-CM | POA: Diagnosis not present

## 2020-05-30 DIAGNOSIS — G894 Chronic pain syndrome: Secondary | ICD-10-CM | POA: Diagnosis not present

## 2020-05-30 DIAGNOSIS — M25569 Pain in unspecified knee: Secondary | ICD-10-CM | POA: Diagnosis not present

## 2020-05-30 NOTE — Telephone Encounter (Signed)
Requested medication (s) are due for refill today:   No  Requested medication (s) are on the active medication list:   Yes  Future visit scheduled:   Yes   Last ordered: 05/19/2020 10.7 g, 2 refills  Clinic note:   Returned because no protocol assigned to this medication.  It looks like it has been prescribed and taken care of.  This is a repeat request.   Requested Prescriptions  Pending Prescriptions Disp Refills   BREZTRI AEROSPHERE 160-9-4.8 MCG/ACT AERO 10.7 g 2    Sig: Inhale 2 puffs into the lungs in the morning and at bedtime.      Off-Protocol Failed - 05/29/2020  3:30 PM      Failed - Medication not assigned to a protocol, review manually.      Passed - Valid encounter within last 12 months    Recent Outpatient Visits           1 week ago Centrilobular emphysema ([place])   [place], DO   1 month ago Centrilobular emphysema [facility])   [place], DO   3 months ago Major depressive disorder, recurrent, in partial remission [facility])   [place], DO   6 months ago Major depressive disorder, recurrent, in partial remission [facility])   [place], DO   7 months ago Centrilobular emphysema [facility])   [facility] [name], DO       Future Appointments             In 4 months Parks Ranger, Devonne Doughty, DO [facility], [facility]

## 2020-06-05 ENCOUNTER — Other Ambulatory Visit: Payer: Self-pay | Admitting: Family Medicine

## 2020-06-05 DIAGNOSIS — J432 Centrilobular emphysema: Secondary | ICD-10-CM

## 2020-06-05 NOTE — Telephone Encounter (Signed)
Medication: BREZTRI AEROSPHERE 160-9-4.8 MCG/ACT AERO [[id number]]   Has the patient contacted their pharmacy? YES  (Agent: If no, request that the patient contact the pharmacy for the refill.) (Agent: If yes, when and what did the pharmacy advise?)  Preferred Pharmacy (with phone number or street name): [facility] [address] [place], [place] - [place] [address] Phone: [phone] Fax: [phone] Hours: Not open 24 hours    Agent: Please be advised that RX refills may take up to 3 business days. We ask that you follow-up with your pharmacy.

## 2020-06-05 NOTE — Telephone Encounter (Addendum)
[employer] [facility] called and spoke to Dian Situ, Merchant navy officer who says they don't have the prescription sent on 05/19/20. Advised it will be resent.

## 2020-06-05 NOTE — Telephone Encounter (Signed)
Requested medication (s) are due for refill today: Yes  Requested medication (s) are on the active medication list: Yes  Last refill:  05/19/20  Future visit scheduled: Yes  Notes to clinic:  Unable to refill per protocol, medication not assigned to protocol. Was not received by pharmacy on 05/19/20, please resend.     Requested Prescriptions  Pending Prescriptions Disp Refills   BREZTRI AEROSPHERE 160-9-4.8 MCG/ACT AERO 10.7 g 2    Sig: Inhale 2 puffs into the lungs in the morning and at bedtime.      Off-Protocol Failed - 06/05/2020 10:39 AM      Failed - Medication not assigned to a protocol, review manually.      Passed - Valid encounter within last 12 months    Recent Outpatient Visits           2 weeks ago Centrilobular emphysema ([place])   [place], DO   2 months ago Centrilobular emphysema [facility])   [place], DO   3 months ago Major depressive disorder, recurrent, in partial remission [facility])   [place], DO   6 months ago Major depressive disorder, recurrent, in partial remission [facility])   [facility] [name], DO   8 months ago Centrilobular emphysema [facility])   [facility] [name], DO       Future Appointments             In 4 months Parks Ranger, Devonne Doughty, DO [facility], [facility]

## 2020-06-15 ENCOUNTER — Telehealth: Payer: Self-pay | Admitting: Family Medicine

## 2020-06-15 DIAGNOSIS — J432 Centrilobular emphysema: Secondary | ICD-10-CM

## 2020-06-15 NOTE — Telephone Encounter (Signed)
Received paperwork from [employer] med assistance program for patient's [facility] inhaler.  Please notify patient that the paperwork was completed and we will submit it to our clinical pharmacy team at [facility] that will process the application, they may call the patient for more information before submitting it directly to the AZ&ME program.  We don't usually process these forms here in our office alone without involving them, so I am referring it to the pharmacy team.  To be faxed to [facility] by Ludwig Clarks 2/18  Jill P. Simcox, CPhT [facility] Fax [place], [facility] 06/15/2020, 2:50 PM

## 2020-06-16 NOTE — Telephone Encounter (Signed)
I left a detail message on the patient personal vm to notify her about the paperwork status.

## 2020-06-16 NOTE — Telephone Encounter (Signed)
Referral received and I see that Ms. Curran has appt scheduled with me on 2/25. Thank you  Harlow Asa, PharmD, [facility] [phone]

## 2020-06-19 DIAGNOSIS — J449 Chronic obstructive pulmonary disease, unspecified: Secondary | ICD-10-CM | POA: Diagnosis not present

## 2020-06-23 ENCOUNTER — Ambulatory Visit (INDEPENDENT_AMBULATORY_CARE_PROVIDER_SITE_OTHER): Payer: Medicare Other | Admitting: Pharmacist

## 2020-06-23 DIAGNOSIS — J432 Centrilobular emphysema: Secondary | ICD-10-CM | POA: Diagnosis not present

## 2020-06-23 DIAGNOSIS — E039 Hypothyroidism, unspecified: Secondary | ICD-10-CM | POA: Diagnosis not present

## 2020-06-23 DIAGNOSIS — I10 Essential (primary) hypertension: Secondary | ICD-10-CM

## 2020-06-23 NOTE — Chronic Care Management (AMB) (Signed)
Chronic Care Management Pharmacy Note  06/23/2020 Name:  Audrey Campbell MRN:  [id number] DOB:  [birth year]  Subjective: Audrey Campbell is an 65 y.o. year old female who is a primary patient of [name], DO.  The CCM team was consulted for assistance with disease management and care coordination needs.    Engaged with patient by telephone for initial visit in response to provider referral for pharmacy case management and/or care coordination services.   Consent to Services:  The patient was given the following information about Chronic Care Management services today, agreed to services, and gave verbal consent: 1. CCM service includes personalized support from designated clinical staff supervised by the primary care provider, including individualized plan of care and coordination with other care providers 2. 24/7 contact phone numbers for assistance for urgent and routine care needs. 3. Service will only be billed when office clinical staff spend 20 minutes or more in a month to coordinate care. 4. Only one practitioner may furnish and bill the service in a calendar month. 5.The patient may stop CCM services at any time (effective at the end of the month) by phone call to the office staff. 6. The patient will be responsible for cost sharing (co-pay) of up to 20% of the service fee (after annual deductible is met). Patient agreed to services and consent obtained.  Objective:  Lab Results  Component Value Date   CREATININE 0.95 10/06/2019   CREATININE 0.90 12/09/2017       Component Value Date/Time   CHOL 219 (H) 10/06/2019 1002   TRIG 188 (H) 10/06/2019 1002   HDL 40 (L) 10/06/2019 1002   CHOLHDL 5.5 (H) 10/06/2019 1002   LDLCALC 147 (H) 10/06/2019 1002    BP Readings from Last 3 Encounters:  05/22/20 119/71  04/06/20 (!) 155/92  02/11/20 (!) 142/80    Assessment: Review of patient past medical history, allergies, medications, health status, including review  of consultants reports, laboratory and other test data, was performed as part of comprehensive evaluation and provision of chronic care management services.   SDOH:  (Social Determinants of Health) assessments and interventions performed: Yes   CCM Care Plan  Allergies  Allergen Reactions  . Sulfa Antibiotics     Reports had reaction ~20 years ago  . Penicillins Rash and Other (See Comments)    Has patient had a PCN reaction causing immediate rash, facial/tongue/throat swelling, SOB or lightheadedness with hypotension: No Has patient had a PCN reaction causing severe rash involving mucus membranes or skin necrosis: No Has patient had a PCN reaction that required hospitalization: No Has patient had a PCN reaction occurring within the last 10 years: Yes If all of the above answers are "NO", then may proceed with Cephalosporin use.     Medications Reviewed Today    Reviewed by Vella Raring, [facility] (Pharmacist) on 06/23/20 at 1234  Med List Status: <None>  Medication Order Taking? Sig Documenting Provider Last Dose Status Informant  amLODipine (NORVASC) 10 MG tablet [id number] Yes Take 1 tablet (10 mg total) by mouth daily. [name], DO Taking Active   atorvastatin (LIPITOR) 40 MG tablet [id number] Yes Take 1 tablet (40 mg total) by mouth daily. [name], DO Taking Active   BREZTRI AEROSPHERE 160-9-4.8 MCG/ACT AERO [id number] Yes INHALE 2 PUFFS BY MOUTH INTO THE LUNGS IN THE MORNING AND AT BEDTIME Kathrine Haddock, NP Taking Active   butalbital-acetaminophen-caffeine [place], ESGIC) 50-325-40 MG tablet [id number] Yes Take 1 tablet by  mouth every 6 (six) hours as needed for headache.  [provider] Taking Active Self  diclofenac Sodium (VOLTAREN) 1 % GEL [id number] Yes Apply topically daily as needed. [provider] Taking Active   gabapentin (NEURONTIN) 400 MG capsule [id number] Yes Take 1 capsule (400 mg total) by mouth 4 (four) times daily.  [name], DO Taking Active   hydrOXYzine (VISTARIL) 25 MG capsule [id number] Yes Take 1 capsule by mouth daily as needed. [provider] Taking Active   levothyroxine (SYNTHROID) 200 MCG tablet [id number] Yes Take 1 tablet (200 mcg total) by mouth daily before breakfast. [name], DO Taking Active   meloxicam (MOBIC) 15 MG tablet [id number] Yes Take 1 tablet (15 mg total) by mouth daily as needed for pain. [name], DO Taking Active   montelukast (SINGULAIR) 10 MG tablet [id number] Yes Take 1 tablet (10 mg total) by mouth daily. [name], DO Taking Active   oxycodone (OXY-IR) 5 MG capsule [id number] Yes Take 5 mg by mouth every 4 (four) hours as needed. 1-2 tablets every 6 hours as needed [provider] Taking Active   polyethylene glycol (MIRALAX / GLYCOLAX) 17 g packet [id number] Yes Take 17 g by mouth daily as needed. [provider] Taking Active   PROAIR HFA 108 [phone] Base) MCG/ACT inhaler [id number] Yes Inhale 1 puff into the lungs. Every 6 hours as needed [provider] Taking Active   QUEtiapine (SEROQUEL) 100 MG tablet [id number] Yes Take 1.5 tablets (150 mg total) by mouth at bedtime. [name], DO Taking Active           Patient Active Problem List   Diagnosis Date Noted  . Abdominal pain, generalized   . Heartburn   . Positive colorectal cancer screening using Cologuard test   . Old tear of meniscus of right knee 11/09/2019  . Mixed hyperlipidemia 10/06/2019  . Essential hypertension 10/06/2019  . Osteoarthritis of spine with radiculopathy, cervical region 10/06/2019  . Chronic pain syndrome 10/06/2019  . Major depressive disorder, recurrent, in partial remission ([place]) 07/09/2019  . Psychophysiological insomnia 07/09/2019  . GAD (generalized anxiety disorder) 07/09/2019  . Primary osteoarthritis involving multiple joints 07/09/2019  . Chronic pain of right knee  07/09/2019  . Chronic bilateral low back pain without sciatica 07/09/2019  . Centrilobular emphysema ([place]) 07/09/2019  . Obesity 07/16/2017  . Hypothyroidism 07/16/2017  . Smoker 07/16/2017  . Plantar fasciitis, bilateral 07/16/2017    Conditions to be addressed/monitored: COPD, hypothyroidism, HTN, tobacco use, HLD  Care Plan : PharmD - Medication Assistance  Updates made by Vella Raring, RPH since 06/23/2020 12:00 AM    Problem: Disease Progression     Long-Range Goal: Disease Progression Prevented or Minimized   Start Date: 06/23/2020  Expected End Date: 09/21/2020  This Visit's Progress: On track  Priority: High  Note:   Current Barriers:  . Unable to independently afford treatment regimen o Reports difficulty with affording copayment for Breztri inhaler through [place] 1  Pharmacist Clinical Goal(s):  Marland Kitchen Over the next 90 days, patient will verbalize ability to afford treatment regimen through collaboration with PharmD and provider.   Interventions: . 1:1 collaboration with [name], DO regarding development and update of comprehensive plan of care as evidenced by provider attestation and co-signature . Inter-disciplinary care team collaboration (see longitudinal plan of care) . Comprehensive medication review performed; medication list updated in electronic medical record o Counsel on increased risk of  anticholinergic side effects with use of Breztri inhaler, as needed hydroxyzine and quetiapine - Patient denies dry mouth - Patient reports constipation controlled with use of daily Miralax . Note patient also on chronic opioid o Counsel patient on increased risk of dizziness/sedation with CNS depressant medications including oxycodone, hydroxyzine and Fioricet - Patient verbalizes understanding. Reports uses oxycodone, hydroxyzine and Fioricet each as needed - Confirms will use caution to avoid risk of falls - Reports oxycodone  managed for her by [facility] for plantar fasciitis . Encourage patient to reschedule missed appointment for colonoscopy from January. Patient states that she will call and confirms has number  Medication Adherence: . Reports currently takes medications from pill bottles. o Encourage patient to start using weekly pillbox for scheduled medications as adherence aid . Counsel patient to take levothyroxine consistently in the morning on an empty stomach, at least 30 minutes before food  Hypertension: . Current treatment: amlodipine 10 mg daily . Denies checking home blood sugar recently o Reports has home wrist monitor o Encourage patient to consider obtaining upper arm blood pressure monitor for greater accuracy o Counsel on obtaining through health plan OTC benefit. Advise patient to follow up with health plan for questions about new OTC benefit card for 2022 calendar year . Will mail patient blood pressure log and blood pressure monitoring education handout as requested  Chronic Obstructive Pulmonary Disease: . Current treatment: o Breztri inhaler - 2 puffs into lungs twice daily o Proair inhaler as needed . Counsel patient on rational/encourage to rinse out mouth after each use of Breztri inhaler (spit out without swallowing)    Medication Assistance: . Will collaborate with [place] Simcox for aid with completing re-enrollment application for [employer] patient assistance program for Breztri inhaler o Patient reports previously approved in 11/2019 for program o Note received message from [place] that application received from office and faxed to program on 2/22 o Advise patient that proof of income document may be needed for application only if requested by AZ&Me.  Tobacco Abuse: . Reports interested in quitting smoking, but not ready to set quit date today o Interested in discussing further during next appointment . Previous quit attempts: unsuccessful using  bupropion . Encourage patient to contact and provide number for  Quitline [phone])   Patient Goals/Self-Care Activities . Over the next 90 days, patient will:  - take medications as prescribed - focus on medication adherence by using weekly pillbox - collaborate with provider on medication access solutions  Follow Up Plan: Telephone follow up appointment with care management team member scheduled for: 3/18 at 10:45 am     Medication Assistance: Application for [facility]  medication assistance program. in process.  See plan of care for additional detail.  Follow Up:  Patient agrees to Care Plan and Follow-up.  Harlow Asa, PharmD, [place] [phone]

## 2020-06-23 NOTE — Patient Instructions (Signed)
Visit Information  PATIENT GOALS:  Goals Addressed            This Visit's Progress   . Pharmacy Goals       Remember to rinse out your mouth after each use of Breztri inhaler (spit out without swallowing)   Please consider using weekly pillbox for organizing your medications  Please consider calling the [place] Quitline for their help with quitting smoking.  The  Quitline phone number is: [phone]  Feel free to call me with any questions or concerns. I look forward to our next call!  Harlow Asa, PharmD, [place] [phone]       Consent to CCM Services: Ms. Weaber was given information about Chronic Care Management services today including:  1. CCM service includes personalized support from designated clinical staff supervised by her physician, including individualized plan of care and coordination with other care providers 2. 24/7 contact phone numbers for assistance for urgent and routine care needs. 3. Service will only be billed when office clinical staff spend 20 minutes or more in a month to coordinate care. 4. Only one practitioner may furnish and bill the service in a calendar month. 5. The patient may stop CCM services at any time (effective at the end of the month) by phone call to the office staff. 6. The patient will be responsible for cost sharing (co-pay) of up to 20% of the service fee (after annual deductible is met).  Patient agreed to services and verbal consent obtained.   The patient verbalized understanding of instructions, educational materials, and care plan provided today and declined offer to receive copy of patient instructions, educational materials, and care plan.   Telephone follow up appointment with care management team member scheduled for:  3/18 at 10:45 am  Harlow Asa, PharmD, [place] [phone]   CLINICAL CARE PLAN: Patient Care Plan: PharmD - Medication Assistance    Problem Identified: Disease Progression     Long-Range Goal: Disease Progression Prevented or Minimized   Start Date: 06/23/2020  Expected End Date: 09/21/2020  This Visit's Progress: On track  Priority: High  Note:   Current Barriers:  . Unable to independently afford treatment regimen o Reports difficulty with affording copayment for Breztri inhaler through [place] 1  Pharmacist Clinical Goal(s):  Marland Kitchen Over the next 90 days, patient will verbalize ability to afford treatment regimen through collaboration with PharmD and provider.   Interventions: . 1:1 collaboration with [name], DO regarding development and update of comprehensive plan of care as evidenced by provider attestation and co-signature . Inter-disciplinary care team collaboration (see longitudinal plan of care) . Comprehensive medication review performed; medication list updated in electronic medical record o Counsel on increased risk of anticholinergic side effects with use of Breztri inhaler, as needed hydroxyzine and quetiapine - Patient denies dry mouth - Patient reports constipation controlled with use of daily Miralax . Note patient also on chronic opioid o Counsel patient on increased risk of dizziness/sedation with CNS depressant medications including oxycodone, hydroxyzine and Fioricet - Patient verbalizes understanding. Reports uses oxycodone, hydroxyzine and Fioricet each as needed - Confirms will use caution to avoid risk of falls - Reports oxycodone managed for her by [facility] for plantar fasciitis . Encourage patient to reschedule missed appointment for colonoscopy from January. Patient states that she will call and confirms has number  Medication Adherence: .  Reports currently takes medications from pill bottles. o Encourage patient to start using weekly  pillbox for scheduled medications as adherence aid . Counsel patient to take levothyroxine consistently in the morning on an empty stomach, at least 30 minutes before food  Hypertension: . Current treatment: amlodipine 10 mg daily . Denies checking home blood sugar recently o Reports has home wrist monitor o Encourage patient to consider obtaining upper arm blood pressure monitor for greater accuracy o Counsel on obtaining through health plan OTC benefit. Advise patient to follow up with health plan for questions about new OTC benefit card for 2022 calendar year . Will mail patient blood pressure log and blood pressure monitoring education handout as requested  Chronic Obstructive Pulmonary Disease: . Current treatment: o Breztri inhaler - 2 puffs into lungs twice daily o Proair inhaler as needed . Counsel patient on rational/encourage to rinse out mouth after each use of Breztri inhaler (spit out without swallowing)    Medication Assistance: . Will collaborate with [place] Simcox for aid with completing re-enrollment application for AZ&Me patient assistance program for Breztri inhaler o Patient reports previously approved in 11/2019 for program o Note received message from [place] that application received from office and faxed to program on 2/22 o Advise patient that proof of income document may be needed for application only if requested by AZ&Me.  Tobacco Abuse: . Reports interested in quitting smoking, but not ready to set quit date today o Interested in discussing further during next appointment . Previous quit attempts: unsuccessful using bupropion . Encourage patient to contact and provide number for [place] Quitline [phone])   Patient Goals/Self-Care Activities . Over the next 90 days, patient will:  - take medications as prescribed - focus on medication adherence by using weekly pillbox - collaborate with provider on medication access solutions  Follow Up Plan:  Telephone follow up appointment with care management team member scheduled for: 3/18 at 10:45 am

## 2020-07-14 ENCOUNTER — Other Ambulatory Visit: Payer: Self-pay | Admitting: Family Medicine

## 2020-07-14 ENCOUNTER — Ambulatory Visit (INDEPENDENT_AMBULATORY_CARE_PROVIDER_SITE_OTHER): Payer: Medicare Other | Admitting: Pharmacist

## 2020-07-14 DIAGNOSIS — J432 Centrilobular emphysema: Secondary | ICD-10-CM | POA: Diagnosis not present

## 2020-07-14 DIAGNOSIS — I10 Essential (primary) hypertension: Secondary | ICD-10-CM

## 2020-07-14 DIAGNOSIS — E039 Hypothyroidism, unspecified: Secondary | ICD-10-CM | POA: Diagnosis not present

## 2020-07-14 NOTE — Chronic Care Management (AMB) (Signed)
Chronic Care Management Pharmacy Note  07/14/2020 Name:  Audrey Campbell MRN:  [id number] DOB:  1956/02/26  Subjective: Audrey Campbell is an 65 y.o. year old female who is a primary patient of [name], DO.  The CCM team was consulted for assistance with disease management and care coordination needs.    Engaged with patient by telephone for follow up visit in response to provider referral for pharmacy case management and/or care coordination services.   Consent to Services:  The patient was given information about Chronic Care Management services, agreed to services, and gave verbal consent prior to initiation of services.  Please see initial visit note for detailed documentation.   Patient Care Team: [name], DO as PCP - General (Family Medicine) Rico Junker, RN as Registered Nurse Theodore Demark, RN as Registered Nurse [place], Virl Diamond, [facility] as Pharmacist  Recent office visits: none  Hospital visits: None in previous 6 months  Objective:  Lab Results  Component Value Date   CREATININE 0.95 10/06/2019   CREATININE 0.90 12/09/2017    Lab Results  Component Value Date   HGBA1C 5.6 10/06/2019       Component Value Date/Time   CHOL 219 (H) 10/06/2019 1002   TRIG 188 (H) 10/06/2019 1002   HDL 40 (L) 10/06/2019 1002   CHOLHDL 5.5 (H) 10/06/2019 1002   LDLCALC 147 (H) 10/06/2019 1002    Hepatic Function Latest Ref Rng & Units 10/06/2019  Total Protein 6.1 - 8.1 g/dL 7.0  AST 10 - 35 U/L 14  ALT 6 - 29 U/L 10  Total Bilirubin 0.2 - 1.2 mg/dL 0.4    Lab Results  Component Value Date/Time   TSH 4.01 10/06/2019 10:02 AM   TSH 3.764 12/10/2017 04:49 AM    CBC Latest Ref Rng & Units 10/06/2019 12/09/2017  WBC 3.8 - 10.8 Thousand/uL 6.9 13.5(H)  Hemoglobin 11.7 - 15.5 g/dL 13.9 12.6  Hematocrit 35.0 - 45.0 % 43.1 36.1  Platelets 140 - 400 Thousand/uL 226 236    Clinical ASCVD: No  The 10-year ASCVD risk score Mikey Bussing  DC Jr., et al., 2013) is: 14%   Values used to calculate the score:     Age: 48 years     Sex: Female     Is Non-Hispanic African American: No     Diabetic: No     Tobacco smoker: Yes     Systolic Blood Pressure: 932 mmHg     Is BP treated: Yes     HDL Cholesterol: 40 mg/dL     Total Cholesterol: 219 mg/dL     Social History   Tobacco Use  Smoking Status Current Every Day Smoker  . Packs/day: 0.50  . Types: Cigarettes  Smokeless Tobacco Never Used   BP Readings from Last 3 Encounters:  05/22/20 119/71  04/06/20 (!) 155/92  02/11/20 (!) 142/80   Pulse Readings from Last 3 Encounters:  05/22/20 82  04/06/20 88  02/11/20 77   Wt Readings from Last 3 Encounters:  05/22/20 182 lb 3.2 oz (82.6 kg)  04/06/20 181 lb (82.1 kg)  02/11/20 187 lb (84.8 kg)    Assessment: Review of patient past medical history, allergies, medications, health status, including review of consultants reports, laboratory and other test data, was performed as part of comprehensive evaluation and provision of chronic care management services.   SDOH:  (Social Determinants of Health) assessments and interventions performed: none   CCM Care Plan  Allergies  Allergen Reactions  . Sulfa Antibiotics     Reports had reaction ~20 years ago  . Penicillins Rash and Other (See Comments)    Has patient had a PCN reaction causing immediate rash, facial/tongue/throat swelling, SOB or lightheadedness with hypotension: No Has patient had a PCN reaction causing severe rash involving mucus membranes or skin necrosis: No Has patient had a PCN reaction that required hospitalization: No Has patient had a PCN reaction occurring within the last 10 years: Yes If all of the above answers are "NO", then may proceed with Cephalosporin use.     Medications Reviewed Today    Reviewed by Vella Raring, [facility] (Pharmacist) on 06/23/20 at 1234  Med List Status: <None>  Medication Order Taking? Sig Documenting Provider  Last Dose Status Informant  amLODipine (NORVASC) 10 MG tablet [id number] Yes Take 1 tablet (10 mg total) by mouth daily. [name], DO Taking Active   atorvastatin (LIPITOR) 40 MG tablet [id number] Yes Take 1 tablet (40 mg total) by mouth daily. [name], DO Taking Active   BREZTRI AEROSPHERE 160-9-4.8 MCG/ACT AERO [id number] Yes INHALE 2 PUFFS BY MOUTH INTO THE LUNGS IN THE MORNING AND AT BEDTIME Kathrine Haddock, NP Taking Active   butalbital-acetaminophen-caffeine (FIORICET, ESGIC) 50-325-40 MG tablet [id number] Yes Take 1 tablet by mouth every 6 (six) hours as needed for headache.  [provider] Taking Active Self  diclofenac Sodium (VOLTAREN) 1 % GEL [id number] Yes Apply topically daily as needed. [provider] Taking Active   gabapentin (NEURONTIN) 400 MG capsule [id number] Yes Take 1 capsule (400 mg total) by mouth 4 (four) times daily. [name], DO Taking Active   hydrOXYzine (VISTARIL) 25 MG capsule [id number] Yes Take 1 capsule by mouth daily as needed. [provider] Taking Active   levothyroxine (SYNTHROID) 200 MCG tablet [id number] Yes Take 1 tablet (200 mcg total) by mouth daily before breakfast. [name], DO Taking Active   meloxicam (MOBIC) 15 MG tablet [id number] Yes Take 1 tablet (15 mg total) by mouth daily as needed for pain. [name], DO Taking Active   montelukast (SINGULAIR) 10 MG tablet [id number] Yes Take 1 tablet (10 mg total) by mouth daily. [name], DO Taking Active   oxycodone (OXY-IR) 5 MG capsule [id number] Yes Take 5 mg by mouth every 4 (four) hours as needed. 1-2 tablets every 6 hours as needed [provider] Taking Active   polyethylene glycol (MIRALAX / GLYCOLAX) 17 g packet [id number] Yes Take 17 g by mouth daily as needed. [provider] Taking Active   PROAIR HFA 108 [phone] Base) MCG/ACT inhaler [id number] Yes Inhale 1 puff into the  lungs. Every 6 hours as needed [provider] Taking Active   QUEtiapine (SEROQUEL) 100 MG tablet [id number] Yes Take 1.5 tablets (150 mg total) by mouth at bedtime. [name], DO Taking Active           Patient Active Problem List   Diagnosis Date Noted  . Abdominal pain, generalized   . Heartburn   . Positive colorectal cancer screening using Cologuard test   . Old tear of meniscus of right knee 11/09/2019  . Mixed hyperlipidemia 10/06/2019  . Essential hypertension 10/06/2019  . Osteoarthritis of spine with radiculopathy, cervical region 10/06/2019  . Chronic pain syndrome 10/06/2019  . Major depressive disorder, recurrent, in partial remission ([place]) 07/09/2019  . Psychophysiological insomnia 07/09/2019  . GAD (generalized anxiety disorder) 07/09/2019  . Primary osteoarthritis involving multiple  joints 07/09/2019  . Chronic pain of right knee 07/09/2019  . Chronic bilateral low back pain without sciatica 07/09/2019  . Centrilobular emphysema ([place]) 07/09/2019  . Obesity 07/16/2017  . Hypothyroidism 07/16/2017  . Smoker 07/16/2017  . Plantar fasciitis, bilateral 07/16/2017    Immunization History  Administered Date(s) Administered  . Influenza,inj,Quad PF,6+ Mos 01/04/2017, 12/31/2018  . Influenza-Unspecified 02/09/2020  . PFIZER(Purple Top)SARS-COV-2 Vaccination 07/15/2019, 08/11/2019, 02/21/2020  . Pneumococcal Polysaccharide-23 12/11/2017    Conditions to be addressed/monitored: HTN, HLD, hypothyroidism, COPD  Care Plan : PharmD - Medication Assistance  Updates made by Vella Raring, [place] since 07/14/2020 12:00 AM    Problem: Disease Progression     Long-Range Goal: Disease Progression Prevented or Minimized   Start Date: 06/23/2020  Expected End Date: 09/21/2020  Recent Progress: On track  Priority: High  Note:   Current Barriers:  . Unable to independently afford treatment regimen o Reports difficulty with affording copayment for  Breztri inhaler through [place] 1 o Patient APPROVED for patient assistance for Breztri through [employer] through 04/28/2021  Pharmacist Clinical Goal(s):  Marland Kitchen Over the next 90 days, patient will verbalize ability to afford treatment regimen through collaboration with PharmD and provider.   Interventions: . 1:1 collaboration with [name], DO regarding development and update of comprehensive plan of care as evidenced by provider attestation and co-signature . Inter-disciplinary care team collaboration (see longitudinal plan of care)  Hypothyroidism: . Controlled; current treatment: levothyroxine 23mcg once daily . Again counsel patient to take levothyroxine consistently in the morning on an empty stomach, at least 30 minutes before food  Hypertension: . Current treatment: Reports NOT currently taking amlodipine. o Reports self-discontinued amlodipine a couple of weeks ago as felt like taking too much medicine o Reports recent BP readings and have been "good" since stopped taking and so does not want to resume amlodipine at this time. o Reports has not been recording readings. Recalls SBP at home and recent dentist visit ranging 110s-130s o Will collaborate with PCP o Reports has home wrist monitor o Encourage patient to consider obtaining upper arm blood pressure monitor for greater accuracy - Again counsel on obtaining through health plan OTC benefit. Advise patient to follow up with health plan for questions about new OTC benefit card for 2022 calendar year . Patient confirms received blood pressure log and blood pressure monitoring education handout in mail o Encourage patient to monitor home BP, keep log of results and contact office if readings consistently >140/90 or for any symptoms  Hyperlipidemia . Uncontrolled; current treatment: atorvastatin 40 mg daily . Counsel patient on rational for/importance of adherence to atorvastatin/LDL  lowering  Chronic Obstructive Pulmonary Disease: . Current treatment: o Breztri inhaler - 2 puffs into lungs twice daily o Proair inhaler as needed . Again counsel patient on rational/encourage to rinse out mouth after each use of Breztri inhaler (spit out without swallowing)    Medication Assistance: . From review of notes from [place], patient approved 3/1-12/31/2022 for [employer] patient assistance program for [facility] inhaler . Today patient reports has received 3 inhalers from program. . Counsel patient to monitor supply of inhalers and patient confirms having number to call if needed for refill  Medication Adherence: . Reports currently takes medications from pill bottles. o Encouraged patient to start using weekly pillbox for scheduled medications as adherence aid  Tobacco Abuse: . Reports interested in quitting smoking, but not ready to set quit date today . Previous quit attempts:  unsuccessful using bupropion . Have encouraged patient to contact and provide number for [place] Quitline [phone])   Patient Goals/Self-Care Activities . Over the next 90 days, patient will:  - take medications as prescribed - focus on medication adherence by using weekly pillbox - collaborate with provider on medication access solutions  Follow Up Plan: Telephone follow up appointment with care management team member scheduled for: 4/8 at 10:30 am     Medication Assistance: Breztri obtained through [employer] medication assistance program.  Enrollment ends 04/28/2021  Patient's preferred pharmacy is:  [place], [place] - [address] [place] [address] Phone: [phone] Fax: [phone]  Uses pill box? No - Counseled to start using weekly pillbox  Follow Up:  Patient agrees to Care Plan and Follow-up.  Plan: Telephone follow up appointment with care management team member scheduled for:  4/8 at 10:30 am  Harlow Asa, PharmD,  [place] [phone]

## 2020-07-14 NOTE — Patient Instructions (Signed)
Visit Information  PATIENT GOALS: Goals Addressed            This Visit's Progress   . Pharmacy Goals       Remember to rinse out your mouth after each use of Breztri inhaler (spit out without swallowing)   Please consider using weekly pillbox for organizing your medications  Please consider calling the [place] Quitline for their help with quitting smoking.  The [place] Quitline phone number is: [phone]  Feel free to call me with any questions or concerns. I look forward to our next call!   Harlow Asa, PharmD, [place] [phone]       The patient verbalized understanding of instructions, educational materials, and care plan provided today and declined offer to receive copy of patient instructions, educational materials, and care plan.   Telephone follow up appointment with care management team member scheduled for: 4/8 at 10:30 am  Harlow Asa, PharmD, [place] [phone]

## 2020-07-17 DIAGNOSIS — J449 Chronic obstructive pulmonary disease, unspecified: Secondary | ICD-10-CM | POA: Diagnosis not present

## 2020-07-21 ENCOUNTER — Other Ambulatory Visit: Payer: Self-pay | Admitting: Family Medicine

## 2020-07-21 DIAGNOSIS — M4722 Other spondylosis with radiculopathy, cervical region: Secondary | ICD-10-CM

## 2020-07-21 DIAGNOSIS — M159 Polyosteoarthritis, unspecified: Secondary | ICD-10-CM

## 2020-07-21 DIAGNOSIS — M8949 Other hypertrophic osteoarthropathy, multiple sites: Secondary | ICD-10-CM

## 2020-07-21 DIAGNOSIS — G894 Chronic pain syndrome: Secondary | ICD-10-CM

## 2020-08-01 DIAGNOSIS — Z79891 Long term (current) use of opiate analgesic: Secondary | ICD-10-CM | POA: Diagnosis not present

## 2020-08-01 DIAGNOSIS — M542 Cervicalgia: Secondary | ICD-10-CM | POA: Diagnosis not present

## 2020-08-01 DIAGNOSIS — M25519 Pain in unspecified shoulder: Secondary | ICD-10-CM | POA: Diagnosis not present

## 2020-08-01 DIAGNOSIS — M722 Plantar fascial fibromatosis: Secondary | ICD-10-CM | POA: Diagnosis not present

## 2020-08-01 DIAGNOSIS — G894 Chronic pain syndrome: Secondary | ICD-10-CM | POA: Diagnosis not present

## 2020-08-01 DIAGNOSIS — M25569 Pain in unspecified knee: Secondary | ICD-10-CM | POA: Diagnosis not present

## 2020-08-01 DIAGNOSIS — Z79899 Other long term (current) drug therapy: Secondary | ICD-10-CM | POA: Diagnosis not present

## 2020-08-04 ENCOUNTER — Telehealth: Payer: Self-pay

## 2020-08-04 ENCOUNTER — Telehealth: Payer: Self-pay | Admitting: Pharmacist

## 2020-08-04 NOTE — Telephone Encounter (Signed)
  Chronic Care Management   Outreach Note  08/04/2020 Name: Audrey Campbell MRN: [id number] DOB: 29-Apr-1956  Referred by: [name], DO Reason for referral : No chief complaint on file.   Was unable to reach patient via telephone today and have left HIPAA compliant voicemail asking patient to return my call.    Follow Up Plan: Will collaborate with Care Guide to outreach to schedule follow up with me  Harlow Asa, PharmD, [place] [facility] [phone]

## 2020-08-08 NOTE — Telephone Encounter (Signed)
Patient recheduled for 08/23/2020

## 2020-08-08 NOTE — Telephone Encounter (Signed)
Please reschedule f/u with Pharm D at [facility]

## 2020-08-12 ENCOUNTER — Other Ambulatory Visit: Payer: Self-pay | Admitting: Family Medicine

## 2020-08-12 DIAGNOSIS — F5104 Psychophysiologic insomnia: Secondary | ICD-10-CM

## 2020-08-12 NOTE — Telephone Encounter (Signed)
Requested medication (s) are due for refill today: yes  Requested medication (s) are on the active medication list: yes  Last refill:  02/11/20  Future visit scheduled: yes  Notes to clinic:  med not delegated to NT to RF   Requested Prescriptions  Pending Prescriptions Disp Refills   QUEtiapine (SEROQUEL) 100 MG tablet [Pharmacy Med Name: QUETIAPINE FUMARATE 100 MG TAB] 135 tablet 1    Sig: TAKE 1.5 TABLETS (150 MG TOTAL) BY MOUTHAT BEDTIME      Not Delegated - Psychiatry:  Antipsychotics - Second Generation (Atypical) - quetiapine Failed - 08/12/2020  9:18 AM      Failed - This refill cannot be delegated      Failed - ALT in normal range and within 180 days    ALT  Date Value Ref Range Status  10/06/2019 10 6 - 29 U/L Final          Failed - AST in normal range and within 180 days    AST  Date Value Ref Range Status  10/06/2019 14 10 - 35 U/L Final          Passed - Completed PHQ-2 or PHQ-9 in the last 360 days      Passed - Last BP in normal range    BP Readings from Last 1 Encounters:  05/22/20 119/71          Passed - Valid encounter within last 6 months    Recent Outpatient Visits           2 months ago Centrilobular emphysema ([place])   [facility] [name], DO   4 months ago Centrilobular emphysema [facility])   [facility] [place], Devonne Doughty, DO   6 months ago Major depressive disorder, recurrent, in partial remission [facility])   [place], DO   9 months ago Major depressive disorder, recurrent, in partial remission [facility])   [facility] [name], DO   10 months ago Centrilobular emphysema [facility])   [facility] [name], DO       Future Appointments             In 2 months [place], Devonne Doughty, DO [facility], [facility]

## 2020-08-17 DIAGNOSIS — J449 Chronic obstructive pulmonary disease, unspecified: Secondary | ICD-10-CM | POA: Diagnosis not present

## 2020-08-23 ENCOUNTER — Telehealth: Payer: Medicare Other

## 2020-09-08 ENCOUNTER — Other Ambulatory Visit: Payer: Self-pay | Admitting: Family Medicine

## 2020-09-08 ENCOUNTER — Ambulatory Visit (INDEPENDENT_AMBULATORY_CARE_PROVIDER_SITE_OTHER): Payer: Medicare Other | Admitting: Pharmacist

## 2020-09-08 DIAGNOSIS — I10 Essential (primary) hypertension: Secondary | ICD-10-CM

## 2020-09-08 DIAGNOSIS — J432 Centrilobular emphysema: Secondary | ICD-10-CM

## 2020-09-08 DIAGNOSIS — E039 Hypothyroidism, unspecified: Secondary | ICD-10-CM

## 2020-09-08 NOTE — Chronic Care Management (AMB) (Signed)
Chronic Care Management Pharmacy Note  09/08/2020 Name:  Audrey Campbell MRN:  [id number] DOB:  03/24/1956  Subjective: Audrey Campbell is an 65 y.o. year old female who is a primary patient of Smitty CordsKaramalegos, Alexander J, DO.  The CCM team was consulted for assistance with disease management and care coordination needs.    Engaged with patient by telephone for follow up visit in response to provider referral for pharmacy case management and/or care coordination services.   Consent to Services:  The patient was given information about Chronic Care Management services, agreed to services, and gave verbal consent prior to initiation of services.  Please see initial visit note for detailed documentation.   Patient Care Team: Smitty CordsKaramalegos, Alexander J, DO as PCP - General (Family Medicine) Jim LikeLambert, Sheena M, RN as Registered Nurse Scarlett PrestoShaver, Anne F, RN as Registered Nurse Shaquetta Arcos, Jackelyn PolingElisabeth A, RPH-CPP as Pharmacist  Recent office visits: None  Hospital visits: None in previous 6 months  Objective:  Lab Results  Component Value Date   CREATININE 0.95 10/06/2019   CREATININE 0.90 12/09/2017      Component Value Date/Time   CHOL 219 (H) 10/06/2019 1002   TRIG 188 (H) 10/06/2019 1002   HDL 40 (L) 10/06/2019 1002   CHOLHDL 5.5 (H) 10/06/2019 1002   LDLCALC 147 (H) 10/06/2019 1002    Hepatic Function Latest Ref Rng & Units 10/06/2019  Total Protein 6.1 - 8.1 g/dL 7.0  AST 10 - 35 U/L 14  ALT 6 - 29 U/L 10  Total Bilirubin 0.2 - 1.2 mg/dL 0.4    Lab Results  Component Value Date/Time   TSH 4.01 10/06/2019 10:02 AM   TSH 3.764 12/10/2017 04:49 AM    Clinical ASCVD: No  The 10-year ASCVD risk score Denman George(Goff DC Jr., et al., 2013) is: 10.6%   Values used to calculate the score:     Age: 65 years     Sex: Female     Is Non-Hispanic African American: No     Diabetic: No     Tobacco smoker: Yes     Systolic Blood Pressure: 119 mmHg     Is BP treated: No     HDL Cholesterol:  40 mg/dL     Total Cholesterol: 219 mg/dL     Social History   Tobacco Use  Smoking Status Current Every Day Smoker  . Packs/day: 0.50  . Types: Cigarettes  Smokeless Tobacco Never Used   BP Readings from Last 3 Encounters:  05/22/20 119/71  04/06/20 (!) 155/92  02/11/20 (!) 142/80   Pulse Readings from Last 3 Encounters:  05/22/20 82  04/06/20 88  02/11/20 77   Wt Readings from Last 3 Encounters:  05/22/20 182 lb 3.2 oz (82.6 kg)  04/06/20 181 lb (82.1 kg)  02/11/20 187 lb (84.8 kg)    Assessment: Review of patient past medical history, allergies, medications, health status, including review of consultants reports, laboratory and other test data, was performed as part of comprehensive evaluation and provision of chronic care management services.   SDOH:  (Social Determinants of Health) assessments and interventions performed: none   CCM Care Plan  Allergies  Allergen Reactions  . Sulfa Antibiotics     Reports had reaction ~20 years ago  . Penicillins Rash and Other (See Comments)    Has patient had a PCN reaction causing immediate rash, facial/tongue/throat swelling, SOB or lightheadedness with hypotension: No Has patient had a PCN reaction causing severe rash involving mucus membranes or  skin necrosis: No Has patient had a PCN reaction that required hospitalization: No Has patient had a PCN reaction occurring within the last 10 years: Yes If all of the above answers are "NO", then may proceed with Cephalosporin use.     Medications Reviewed Today    Reviewed by Vella Raring, RPH-CPP (Pharmacist) on 09/08/20 at 1120  Med List Status: <None>  Medication Order Taking? Sig Documenting Provider Last Dose Status Informant  atorvastatin (LIPITOR) 40 MG tablet [id number] Yes Take 1 tablet (40 mg total) by mouth daily. [name], DO Taking Active   BREZTRI AEROSPHERE 160-9-4.8 MCG/ACT AERO [id number] Yes INHALE 2 PUFFS BY MOUTH INTO THE LUNGS IN THE  MORNING AND AT BEDTIME Kathrine Haddock, NP Taking Active   butalbital-acetaminophen-caffeine (FIORICET, ESGIC) 50-325-40 MG tablet [id number]  Take 1 tablet by mouth every 6 (six) hours as needed for headache.  [provider]  Active Self  diclofenac Sodium (VOLTAREN) 1 % GEL [id number]  Apply topically daily as needed. [provider]  Active   gabapentin (NEURONTIN) 400 MG capsule [id number]  TAKE 1 CAPSULE BY MOUTH 4 TIMES DAILY Karamalegos, Devonne Doughty, DO  Active   hydrOXYzine (VISTARIL) 25 MG capsule [id number]  Take 1 capsule by mouth daily as needed. [provider]  Active   levothyroxine (SYNTHROID) 200 MCG tablet [id number] Yes Take 1 tablet (200 mcg total) by mouth daily before breakfast. [name], DO Taking Active   meloxicam (MOBIC) 15 MG tablet [id number]  Take 1 tablet (15 mg total) by mouth daily as needed for pain. Karamalegos, Devonne Doughty, DO  Active   montelukast (SINGULAIR) 10 MG tablet [id number] Yes Take 1 tablet (10 mg total) by mouth daily. [name], DO Taking Active   oxycodone (OXY-IR) 5 MG capsule [id number]  Take 5 mg by mouth every 4 (four) hours as needed. 1-2 tablets every 6 hours as needed [provider]  Active   polyethylene glycol (MIRALAX / GLYCOLAX) 17 g packet [id number]  Take 17 g by mouth daily as needed. [provider]  Active   PROAIR HFA 108 [phone] Base) MCG/ACT inhaler [id number] No Inhale 1 puff into the lungs. Every 6 hours as needed  Patient not taking: Reported on 09/08/2020   [provider] Not Taking Active   QUEtiapine (SEROQUEL) 100 MG tablet [id number]  TAKE 1.5 TABLETS (150 MG TOTAL) BY MOUTHAT BEDTIME [name], DO  Active           Patient Active Problem List   Diagnosis Date Noted  . Abdominal pain, generalized   . Heartburn   . Positive colorectal cancer screening using Cologuard test   . Old tear of meniscus of right knee 11/09/2019  . Mixed  hyperlipidemia 10/06/2019  . Essential hypertension 10/06/2019  . Osteoarthritis of spine with radiculopathy, cervical region 10/06/2019  . Chronic pain syndrome 10/06/2019  . Major depressive disorder, recurrent, in partial remission ([place]) 07/09/2019  . Psychophysiological insomnia 07/09/2019  . GAD (generalized anxiety disorder) 07/09/2019  . Primary osteoarthritis involving multiple joints 07/09/2019  . Chronic pain of right knee 07/09/2019  . Chronic bilateral low back pain without sciatica 07/09/2019  . Centrilobular emphysema ([place]) 07/09/2019  . Obesity 07/16/2017  . Hypothyroidism 07/16/2017  . Smoker 07/16/2017  . Plantar fasciitis, bilateral 07/16/2017    Immunization History  Administered Date(s) Administered  . Influenza,inj,Quad PF,6+ Mos 01/04/2017, 12/31/2018  . Influenza-Unspecified 02/09/2020  . PFIZER(Purple Top)SARS-COV-2 Vaccination 07/15/2019, 08/11/2019, 02/21/2020  . Pneumococcal Polysaccharide-23  12/11/2017    Conditions to be addressed/monitored: HTN, hypothyroidism, COPD, HLD  Care Plan : PharmD - Medication Assistance  Updates made by Vella Raring, RPH-CPP since 09/08/2020 12:00 AM    Problem: Disease Progression     Long-Range Goal: Disease Progression Prevented or Minimized   Start Date: 06/23/2020  Expected End Date: 09/21/2020  Recent Progress: On track  Priority: High  Note:   Current Barriers:  . Unable to independently afford treatment regimen o Reports difficulty with affording copayment for Breztri inhaler through [place] [facility] o Patient APPROVED for patient assistance for Breztri through [place] through 04/28/2021  Pharmacist Clinical Goal(s):  Marland Kitchen Over the next 90 days, patient will verbalize ability to afford treatment regimen through collaboration with PharmD and provider.   Interventions: . 1:1 collaboration with [name], DO regarding development and update of comprehensive plan of care as  evidenced by provider attestation and co-signature . Inter-disciplinary care team collaboration (see longitudinal plan of care) . Today reports has felt like she is starting to get a cold for the past few days and has been sleeping more, but denies other symptoms o Advise patient to contact office about new or worsening symptoms  Chronic Obstructive Pulmonary Disease: . Current treatment: o Breztri inhaler - 2 puffs into lungs twice daily o Proair inhaler as needed . Counsel patient on importance of using maintenance, Breztri inhaler, as directed (including rinsing mouth out after each use) and using rescue, Proair, inhaler as needed . Identify patient in need of refill of Proair inhaler and advise patient to call today for refill . Counsel patient on inhaler administration technique  Hypothyroidism: . Controlled; current treatment: levothyroxine 229mcg once daily . Again remind patient to take levothyroxine consistently in the morning on an empty stomach, at least 30 minutes before food  Hypertension: . Current treatment: None . Previously collaborated with PCP on 3/18 as patient reported self-discontinued amlodipine a few weeks prior and that BP readings had been good since. Counseled patient to monitor home BP and keep a log of the results . Today patient denies having monitored home BP recently o Reports has home wrist monitor o Again encourage patient to obtain upper arm blood pressure monitor for greater accuracy - Reports will contact health plan to order from OTC benefit today . Again counsel patient to monitor home BP, keep log of results and contact office if readings consistently >140/90 or for any symptoms  Hyperlipidemia . Uncontrolled; current treatment: atorvastatin 40 mg daily . Counsel patient on rational for/importance of adherence to atorvastatin/LDL lowering  Medication Adherence: . Reports currently takes medications from pill bottles. o Again encourage patient to  start using weekly pillbox for scheduled medications as adherence aid  Tobacco Abuse: . Have encouraged patient to contact and provide number for [place] Quitline [phone])  Patient Goals/Self-Care Activities . Over the next 90 days, patient will:  - take medications as prescribed - focus on medication adherence by using weekly pillbox - collaborate with provider on medication access solutions  Follow Up Plan: Telephone follow up appointment with care management team member scheduled for: 6/1 at 10 am     Medication Assistance:Patient APPROVED for patient assistance for Breztri through [employer] through 04/28/2021  Patient's preferred pharmacy is:  [facility], [place] - [address] [place] [address] Phone: [phone] Fax: [phone]   Follow Up:  Patient agrees to Care Plan and Follow-up.  Harlow Asa, PharmD, BCACP, CPP  [place] [phone]

## 2020-09-08 NOTE — Patient Instructions (Signed)
Visit Information  PATIENT GOALS: Goals Addressed            This Visit's Progress   . Pharmacy Goals       Remember to rinse out your mouth after each use of Breztri inhaler (spit out without swallowing)   Please consider using weekly pillbox for organizing your medications  Please consider calling the [place] Quitline for their help with quitting smoking.  The [place] Quitline phone number is: [phone]   Feel free to call me with any questions or concerns. I look forward to our next call!    Harlow Asa, PharmD, [place] [phone]       The patient verbalized understanding of instructions, educational materials, and care plan provided today and declined offer to receive copy of patient instructions, educational materials, and care plan.   Telephone follow up appointment with care management team member scheduled for: 6/1 at 10 am

## 2020-09-08 NOTE — Telephone Encounter (Signed)
Requested medication (s) are due for refill today: historical med  Requested medication (s) are on the active medication list: yes   Last refill:  05/27/20  Future visit scheduled: yes in 1 month  Notes to clinic:  historical provider ordered medication , do you want to renew Rx?     Requested Prescriptions  Pending Prescriptions Disp Refills   PROAIR HFA 108 (90 Base) MCG/ACT inhaler [Pharmacy Med Name: PROAIR HFA 108 (90 BASE) MCG/ACT IN] 8.5 g     Sig: INHALE 1 TO 2 PUFFS INTO THE LUNGS EVERY6 HOURS AS NEEDED FOR WHEEZING OR SHORTNESS OF BREATH      Pulmonology:  Beta Agonists Failed - 09/08/2020  3:56 PM      Failed - One inhaler should last at least one month. If the patient is requesting refills earlier, contact the patient to check for uncontrolled symptoms.      Passed - Valid encounter within last 12 months    Recent Outpatient Visits           3 months ago Centrilobular emphysema ([facility])   [facility], Devonne Doughty, DO   5 months ago Centrilobular emphysema [facility])   [place], DO   7 months ago Major depressive disorder, recurrent, in partial remission [facility])   [place], DO   10 months ago Major depressive disorder, recurrent, in partial remission [facility])   [facility] [name], DO   11 months ago Centrilobular emphysema [facility])   [facility] [name], DO       Future Appointments             In 1 month Parks Ranger, Devonne Doughty, DO [facility], [facility]

## 2020-09-09 DIAGNOSIS — U071 COVID-19: Secondary | ICD-10-CM | POA: Diagnosis not present

## 2020-09-09 DIAGNOSIS — Z20822 Contact with and (suspected) exposure to covid-19: Secondary | ICD-10-CM | POA: Diagnosis not present

## 2020-09-11 ENCOUNTER — Telehealth: Payer: Self-pay

## 2020-09-11 NOTE — Telephone Encounter (Signed)
Copied from [place] [id number]. Topic: General - Other >> Sep 11, 2020  1:12 PM Alanda Slim E wrote: Reason for CRM: pt called to advise pcp that she has tested positive for covid/ nothing needed at this time just an [place]

## 2020-09-13 DIAGNOSIS — Z20822 Contact with and (suspected) exposure to covid-19: Secondary | ICD-10-CM | POA: Diagnosis not present

## 2020-09-14 ENCOUNTER — Telehealth: Payer: Self-pay

## 2020-09-14 NOTE — Telephone Encounter (Signed)
  Reason for CRM: Pt tested positive for covid approx 5/16 and her boyfriend Rutherford Guys also is positive. There was an appt sch for Rutherford Guys but looks to be cancelled and his meds were called in. This pt is upset that she had no meds called in. I offered a virtual appt but she declined as she just wants meds called in. She said that if Dr Raliegh Ip made her make an appt she would do to get the med but states that Nicki Reaper did not have an appt. Fu with pt at [phone]  2:20pm: I called and spoke with the pt and explained the issue with the appt and about the circumstances as to why his was taken off the schedule. I told her that he wasn't able to use a phone, therefore there was no way to do the visit at that time and that he made an appt for tomorrow. I told her that to be able to get meds for her symptoms she would need a virtual appt which she scheduled for tomorrow at 11am. Pt voiced understanding.

## 2020-09-15 ENCOUNTER — Telehealth (INDEPENDENT_AMBULATORY_CARE_PROVIDER_SITE_OTHER): Payer: Medicare Other | Admitting: Family Medicine

## 2020-09-15 ENCOUNTER — Other Ambulatory Visit: Payer: Self-pay

## 2020-09-15 ENCOUNTER — Encounter: Payer: Self-pay | Admitting: Family Medicine

## 2020-09-15 VITALS — Ht 64.0 in | Wt 167.0 lb

## 2020-09-15 DIAGNOSIS — U071 COVID-19: Secondary | ICD-10-CM | POA: Diagnosis not present

## 2020-09-15 DIAGNOSIS — J441 Chronic obstructive pulmonary disease with (acute) exacerbation: Secondary | ICD-10-CM | POA: Diagnosis not present

## 2020-09-15 MED ORDER — PREDNISONE 50 MG PO TABS: 50.0000 mg | ORAL_TABLET | Freq: Every day | ORAL | 0 refills | Status: DC

## 2020-09-15 NOTE — Progress Notes (Signed)
Virtual Visit via Telephone The purpose of this virtual visit is to provide medical care while limiting exposure to the novel coronavirus (COVID19) for both patient and office staff.  Consent was obtained for phone visit:  Yes.   Answered questions that patient had about telehealth interaction:  Yes.   I discussed the limitations, risks, security and privacy concerns of performing an evaluation and management service by telephone. I also discussed with the patient that there may be a patient responsible charge related to this service. The patient expressed understanding and agreed to proceed.  Patient Location: Home Provider Location: [facility] (Office)  Participants in virtual visit: - Patient: Audrey Campbell - CMA: Orinda Kenner, [place] - Provider: Dr Parks Ranger  ---------------------------------------------------------------------- Chief Complaint  Patient presents with  . Covid Positive  . Cough  . Nasal Congestion    S: Reviewed CMA documentation. I have called patient and gathered additional HPI as follows:  COVID19 Positive Hx COPD Reports that symptoms started 1 week ago Wednesday 09/06/20 with headache and congestion not draining, with head sinus pressure, she tested positive COVID at home, then she went to Urgent Care and confirm positive - She was feeling somewhat better now about 50% improved, still has some chest tightness. Not much cough - History of COPD, on Breztri. Admits some fatigue, nausea  Her partner with covid positive as well  Denies any fevers, chills, sweats, body ache, sinus pain or pressure, headache, abdominal pain, diarrhea  Past Medical History:  Diagnosis Date  . Anxiety   . Arthritis   . COPD (chronic obstructive pulmonary disease) ([place])   . Hyperlipidemia   . Hypertension   . Hypothyroidism   . On home oxygen therapy   . Plantar fasciitis    Social History   Tobacco Use  . Smoking status: Current Every Day  Smoker    Packs/day: 0.50    Types: Cigarettes  . Smokeless tobacco: Never Used  Vaping Use  . Vaping Use: Never used  Substance Use Topics  . Alcohol use: No  . Drug use: No    Current Outpatient Medications:  .  atorvastatin (LIPITOR) 40 MG tablet, Take 1 tablet (40 mg total) by mouth daily., Disp: 90 tablet, Rfl: 1 .  BREZTRI AEROSPHERE 160-9-4.8 MCG/ACT AERO, INHALE 2 PUFFS BY MOUTH INTO THE LUNGS IN THE MORNING AND AT BEDTIME, Disp: 10.7 g, Rfl: 2 .  butalbital-acetaminophen-caffeine (FIORICET, ESGIC) 50-325-40 MG tablet, Take 1 tablet by mouth every 6 (six) hours as needed for headache. , Disp: , Rfl: 4 .  diclofenac Sodium (VOLTAREN) 1 % GEL, Apply topically daily as needed., Disp: , Rfl:  .  gabapentin (NEURONTIN) 400 MG capsule, TAKE 1 CAPSULE BY MOUTH 4 TIMES DAILY, Disp: 360 capsule, Rfl: 1 .  hydrOXYzine (VISTARIL) 25 MG capsule, Take 1 capsule by mouth daily as needed., Disp: , Rfl:  .  levothyroxine (SYNTHROID) 200 MCG tablet, Take 1 tablet (200 mcg total) by mouth daily before breakfast., Disp: 90 tablet, Rfl: 1 .  meloxicam (MOBIC) 15 MG tablet, Take 1 tablet (15 mg total) by mouth daily as needed for pain., Disp: 90 tablet, Rfl: 1 .  montelukast (SINGULAIR) 10 MG tablet, Take 1 tablet (10 mg total) by mouth daily., Disp: 90 tablet, Rfl: 1 .  oxycodone (OXY-IR) 5 MG capsule, Take 5 mg by mouth every 4 (four) hours as needed. 1-2 tablets every 6 hours as needed, Disp: , Rfl:  .  polyethylene glycol (MIRALAX / GLYCOLAX) 17  g packet, Take 17 g by mouth daily as needed., Disp: , Rfl:  .  predniSONE (DELTASONE) 50 MG tablet, Take 1 tablet (50 mg total) by mouth daily with breakfast., Disp: 5 tablet, Rfl: 0 .  PROAIR HFA 108 (90 Base) MCG/ACT inhaler, INHALE 1 TO 2 PUFFS INTO THE LUNGS EVERY6 HOURS AS NEEDED FOR WHEEZING OR SHORTNESS OF BREATH, Disp: 8.5 g, Rfl: 2 .  QUEtiapine (SEROQUEL) 100 MG tablet, TAKE 1.5 TABLETS (150 MG TOTAL) BY MOUTHAT BEDTIME, Disp: 135 tablet, Rfl:  1  Depression screen [facility] 2/9 05/22/2020 04/06/2020 04/06/2020  Decreased Interest 1 0 0  Down, Depressed, Hopeless 1 0 0  PHQ - 2 Score 2 0 0  Altered sleeping 0 - -  Tired, decreased energy 1 - -  Change in appetite 0 - -  Feeling bad or failure about yourself  0 - -  Trouble concentrating 0 - -  Moving slowly or fidgety/restless 0 - -  Suicidal thoughts 0 - -  PHQ-9 Score 3 - -  Difficult doing work/chores Not difficult at all - -    GAD 7 : Generalized Anxiety Score 02/11/2020 11/09/2019 07/09/2019  Nervous, Anxious, on Edge 0 2 2  Control/stop worrying 1 3 3   Worry too much - different things 1 3 2   Trouble relaxing 1 2 2   Restless 0 2 1  Easily annoyed or irritable 1 3 3   Afraid - awful might happen 0 2 0  Total GAD 7 Score 4 17 13   Anxiety Difficulty Somewhat difficult Very difficult Very difficult    -------------------------------------------------------------------------- O: No physical exam performed due to remote telephone encounter.  Lab results reviewed.  No results found for this or any previous visit (from the past 2160 hour(s)).  -------------------------------------------------------------------------- A&P:  Problem List Items Addressed This Visit   None   Visit Diagnoses    COVID-19 virus infection    -  Primary   Relevant Medications   predniSONE (DELTASONE) 50 MG tablet   COPD with acute exacerbation (HCC)       Relevant Medications   predniSONE (DELTASONE) 50 MG tablet     COVID19 infection Acute onset over 1 week ago 5/11 Duration >5 days symptoms now, not candidate for anti viral medication Higher risk w/ COPD Clinically mild case now with improvement. She is vaccinated.  Will add Prednisone burst 50mg  x 5 days daily for COPD component with her breathing otherwise improving. No other complications  Return to care options given if worsening.   Meds ordered this encounter  Medications  . predniSONE (DELTASONE) 50 MG tablet    Sig: Take 1  tablet (50 mg total) by mouth daily with breakfast.    Dispense:  5 tablet    Refill:  0    Follow-up: - Return PRN  Patient verbalizes understanding with the above medical recommendations including the limitation of remote medical advice.  Specific follow-up and call-back criteria were given for patient to follow-up or seek medical care more urgently if needed.   - Time spent in direct consultation with patient on phone: 12 minutes  Nobie Putnam, [facility] 09/15/2020, 11:40 AM

## 2020-09-16 DIAGNOSIS — J449 Chronic obstructive pulmonary disease, unspecified: Secondary | ICD-10-CM | POA: Diagnosis not present

## 2020-09-18 ENCOUNTER — Ambulatory Visit: Payer: Self-pay | Admitting: Pharmacist

## 2020-09-18 DIAGNOSIS — J441 Chronic obstructive pulmonary disease with (acute) exacerbation: Secondary | ICD-10-CM

## 2020-09-18 DIAGNOSIS — J432 Centrilobular emphysema: Secondary | ICD-10-CM | POA: Diagnosis not present

## 2020-09-18 DIAGNOSIS — E039 Hypothyroidism, unspecified: Secondary | ICD-10-CM | POA: Diagnosis not present

## 2020-09-18 DIAGNOSIS — I10 Essential (primary) hypertension: Secondary | ICD-10-CM

## 2020-09-18 DIAGNOSIS — U071 COVID-19: Secondary | ICD-10-CM

## 2020-09-18 NOTE — Patient Instructions (Signed)
Visit Information  PATIENT GOALS: Goals Addressed            This Visit's Progress   . Pharmacy Goals       Remember to rinse out your mouth after each use of Breztri inhaler (spit out without swallowing)   Please consider using weekly pillbox for organizing your medications  Please consider calling the [place] Quitline for their help with quitting smoking.  The [place] Quitline phone number is: [phone]  Feel free to call me with any questions or concerns. I look forward to our next call!   Harlow Asa, PharmD, [place] [phone]       The patient verbalized understanding of instructions, educational materials, and care plan provided today and declined offer to receive copy of patient instructions, educational materials, and care plan.   Telephone follow up appointment with care management team member scheduled for: 6/1

## 2020-09-18 NOTE — Chronic Care Management (AMB) (Signed)
Chronic Care Management Pharmacy Note  09/18/2020 Name:  Audrey Campbell MRN:  [id number] DOB:  30-Jun-1955  Subjective: Audrey Campbell is an 65 y.o. year old female who is a primary patient of [name], DO.  The CCM team was consulted for assistance with disease management and care coordination needs.    Receive a call from patient.   Engaged with patient by telephone for follow up visit in response to provider referral for pharmacy case management and/or care coordination services.   Consent to Services:  The patient was given information about Chronic Care Management services, agreed to services, and gave verbal consent prior to initiation of services.  Please see initial visit note for detailed documentation.   Patient Care Team: [name], DO as PCP - General (Family Medicine) Rico Junker, RN as Registered Nurse Theodore Demark, RN as Registered Nurse Drayce Tawil, Virl Diamond, RPH-CPP as Pharmacist  Recent office visits: Telemedicine visit with PCP on 5/20 for COVID-19 virus infection  Hospital visits: None in previous 6 months  Objective:   Social History   Tobacco Use  Smoking Status Current Every Day Smoker  . Packs/day: 0.50  . Types: Cigarettes  Smokeless Tobacco Never Used   BP Readings from Last 3 Encounters:  05/22/20 119/71  04/06/20 (!) 155/92  02/11/20 (!) 142/80   Pulse Readings from Last 3 Encounters:  05/22/20 82  04/06/20 88  02/11/20 77   Wt Readings from Last 3 Encounters:  09/15/20 167 lb (75.8 kg)  05/22/20 182 lb 3.2 oz (82.6 kg)  04/06/20 181 lb (82.1 kg)    Assessment: Review of patient past medical history, allergies, medications, health status, including review of consultants reports, laboratory and other test data, was performed as part of comprehensive evaluation and provision of chronic care management services.   SDOH:  (Social Determinants of Health) assessments and interventions  performed: none   CCM Care Plan  Allergies  Allergen Reactions  . Sulfa Antibiotics     Reports had reaction ~20 years ago  . Penicillins Rash and Other (See Comments)    Has patient had a PCN reaction causing immediate rash, facial/tongue/throat swelling, SOB or lightheadedness with hypotension: No Has patient had a PCN reaction causing severe rash involving mucus membranes or skin necrosis: No Has patient had a PCN reaction that required hospitalization: No Has patient had a PCN reaction occurring within the last 10 years: Yes If all of the above answers are "NO", then may proceed with Cephalosporin use.     Medications Reviewed Today    Reviewed by [name], DO (Physician) on 09/15/20 at 1140  Med List Status: <None>  Medication Order Taking? Sig Documenting Provider Last Dose Status Informant  atorvastatin (LIPITOR) 40 MG tablet [id number] Yes Take 1 tablet (40 mg total) by mouth daily. [name], DO Taking Active   BREZTRI AEROSPHERE 160-9-4.8 MCG/ACT AERO [id number] Yes INHALE 2 PUFFS BY MOUTH INTO THE LUNGS IN THE MORNING AND AT BEDTIME Kathrine Haddock, NP Taking Active   butalbital-acetaminophen-caffeine (FIORICET, ESGIC) 50-325-40 MG tablet [id number] Yes Take 1 tablet by mouth every 6 (six) hours as needed for headache.  [provider] Taking Active Self  diclofenac Sodium (VOLTAREN) 1 % GEL [id number] Yes Apply topically daily as needed. [provider] Taking Active   gabapentin (NEURONTIN) 400 MG capsule [id number] Yes TAKE 1 CAPSULE BY MOUTH 4 TIMES DAILY Karamalegos, Devonne Doughty, DO Taking Active   hydrOXYzine (VISTARIL) 25 MG  capsule [id number] Yes Take 1 capsule by mouth daily as needed. [provider] Taking Active   levothyroxine (SYNTHROID) 200 MCG tablet [id number] Yes Take 1 tablet (200 mcg total) by mouth daily before breakfast. [name], DO Taking Active   meloxicam (MOBIC) 15 MG tablet [id number]  Yes Take 1 tablet (15 mg total) by mouth daily as needed for pain. [name], DO Taking Active   montelukast (SINGULAIR) 10 MG tablet [id number] Yes Take 1 tablet (10 mg total) by mouth daily. [name], DO Taking Active   oxycodone (OXY-IR) 5 MG capsule [id number] Yes Take 5 mg by mouth every 4 (four) hours as needed. 1-2 tablets every 6 hours as needed [provider] Taking Active   polyethylene glycol (MIRALAX / GLYCOLAX) 17 g packet [id number] Yes Take 17 g by mouth daily as needed. [provider] Taking Active   PROAIR HFA 108 [phone] Base) MCG/ACT inhaler [id number] Yes INHALE 1 TO 2 PUFFS INTO THE LUNGS EVERY6 HOURS AS NEEDED FOR WHEEZING OR SHORTNESS OF BREATH [name], DO Taking Active   QUEtiapine (SEROQUEL) 100 MG tablet [id number] Yes TAKE 1.5 TABLETS (150 MG TOTAL) BY MOUTHAT BEDTIME [name], DO Taking Active           Patient Active Problem List   Diagnosis Date Noted  . Abdominal pain, generalized   . Heartburn   . Positive colorectal cancer screening using Cologuard test   . Old tear of meniscus of right knee 11/09/2019  . Mixed hyperlipidemia 10/06/2019  . Essential hypertension 10/06/2019  . Osteoarthritis of spine with radiculopathy, cervical region 10/06/2019  . Chronic pain syndrome 10/06/2019  . Major depressive disorder, recurrent, in partial remission ([place]) 07/09/2019  . Psychophysiological insomnia 07/09/2019  . GAD (generalized anxiety disorder) 07/09/2019  . Primary osteoarthritis involving multiple joints 07/09/2019  . Chronic pain of right knee 07/09/2019  . Chronic bilateral low back pain without sciatica 07/09/2019  . Centrilobular emphysema ([place]) 07/09/2019  . Obesity 07/16/2017  . Hypothyroidism 07/16/2017  . Smoker 07/16/2017  . Plantar fasciitis, bilateral 07/16/2017    Immunization History  Administered Date(s) Administered  . Influenza,inj,Quad PF,6+ Mos 01/04/2017,  12/31/2018  . Influenza-Unspecified 02/09/2020  . PFIZER(Purple Top)SARS-COV-2 Vaccination 07/15/2019, 08/11/2019, 02/21/2020  . Pneumococcal Polysaccharide-23 12/11/2017    Conditions to be addressed/monitored: HTN, hypothyroidism, COPD, HLD  Care Plan : PharmD - Medication Assistance  Updates made by Vella Raring, RPH-CPP since 09/18/2020 12:00 AM    Problem: Disease Progression     Long-Range Goal: Disease Progression Prevented or Minimized   Start Date: 06/23/2020  Expected End Date: 09/21/2020  This Visit's Progress: On track  Recent Progress: On track  Priority: High  Note:   Current Barriers:  . Unable to independently afford treatment regimen o Reports difficulty with affording copayment for Breztri inhaler through [place] 1 o Patient APPROVED for patient assistance for Breztri through [place] through 04/28/2021  Pharmacist Clinical Goal(s):  Marland Kitchen Over the next 90 days, patient will verbalize ability to afford treatment regimen through collaboration with PharmD and provider.   Interventions: . 1:1 collaboration with [name], DO regarding development and update of comprehensive plan of care as evidenced by provider attestation and co-signature . Inter-disciplinary care team collaboration (see longitudinal plan of care) . Perform chart review. Patient seen for Telemedicine visit with PCP on 5/20 for COVID-19 virus infection o Reported symptoms started on 5/11 and tested COVID-19 positive at home & Urgent  care o Provider started Rx: Prednisone burst 50mg  x 5 days daily  . Today patient reports that she is better in terms of COVID-19 infection, but has been bothered by restless legs since started taking prednisone. Reports restless legs are making it difficult for her to sleep. o Reports has completed 4/5 days of prednisone course. Confirms taking doses in the morning. o Collaborate with PCP. Provider agrees with plan to have patient  end prednisone course o Follow back up with patient. Patient verbalizes understanding that she will not take final dose of prednisone tomorrow, and will use follow-up and call-back criteria given for patient to follow-up or seek medical care more urgently if needed  Chronic Obstructive Pulmonary Disease: . Current treatment: o Breztri inhaler - 2 puffs into lungs twice daily o Proair inhaler as needed . Confirms using maintenance, Breztri inhaler, as directed (including rinsing mouth out after each use) and using rescue, Proair, inhaler as needed . Reports also taking montelukast 10 mg daily and fluticasone nasal spray as needed  Tobacco Abuse: . Have encouraged patient to contact and provide number for [place] Quitline [phone])  Patient Goals/Self-Care Activities . Over the next 90 days, patient will:  - take medications as prescribed - focus on medication adherence by using weekly pillbox - collaborate with provider on medication access solutions  Follow Up Plan: Will keep telephone follow up appointment with care management team member as previously scheduled for: 6/1 at 10 am     Patient's preferred pharmacy is:  [place], [place] - [address] [place] [address] Phone: [phone] Fax: [phone]   Follow Up:  Patient agrees to Care Plan and Follow-up.  Harlow Asa, PharmD, Para [place], CPP Clinical Pharmacist [facility] [phone]

## 2020-09-21 ENCOUNTER — Telehealth: Payer: Self-pay

## 2020-09-21 ENCOUNTER — Telehealth: Payer: Self-pay | Admitting: Family Medicine

## 2020-09-21 NOTE — Telephone Encounter (Signed)
Copied from [place] [id number]. Topic: General - Inquiry >> Sep 18, 2020  9:29 AM Greggory Keen D wrote: Reason for CRM: Pt has had Covid for about a week and half.  She is now having   Restless leg.  She has been taking prednisone since Friday.  She thinks it has been going on since she started taking the prednisone.  CB#  [phone] or [phone]

## 2020-09-21 NOTE — Telephone Encounter (Signed)
Copied from [place] [id number]. Topic: Medicare AWV >> Sep 21, 2020  3:00 PM Cher Nakai R wrote: Reason for CRM: Left message for patient to call back and schedule Medicare Annual Wellness Visit (AWV) to be done virtually or by telephone.  No hx of AWV eligible as of 08/28/2019 awvi  Please schedule at anytime with [facility] Advisor.      40 Minutes appointment   Any questions, please call me at [phone]

## 2020-09-22 NOTE — Telephone Encounter (Signed)
I attempted to contact the patient to offer an appt, no answer. LMOM to return my call.

## 2020-09-22 NOTE — Telephone Encounter (Signed)
Offer her a virtual visit to discuss today. If she does not want that, she can stop Prednisone and see if symptoms resolve.

## 2020-09-22 NOTE — Telephone Encounter (Signed)
Pt called back, declined appt offer. Says her restless leg is much better however she is still trying to recover from covid. Still not 100%. Will call back if further assistance is needed

## 2020-09-27 ENCOUNTER — Ambulatory Visit (INDEPENDENT_AMBULATORY_CARE_PROVIDER_SITE_OTHER): Payer: Medicare Other | Admitting: Pharmacist

## 2020-09-27 DIAGNOSIS — E039 Hypothyroidism, unspecified: Secondary | ICD-10-CM

## 2020-09-27 DIAGNOSIS — J432 Centrilobular emphysema: Secondary | ICD-10-CM

## 2020-09-27 DIAGNOSIS — I10 Essential (primary) hypertension: Secondary | ICD-10-CM

## 2020-09-27 NOTE — Chronic Care Management (AMB) (Signed)
Chronic Care Management Pharmacy Note  09/27/2020 Name:  Audrey Campbell MRN:  [id number] DOB:  [birth year]   Subjective: Audrey Campbell is an 65 y.o. year old female who is a primary patient of [name], DO.  The CCM team was consulted for assistance with disease management and care coordination needs.    Engaged with patient by telephone for follow up visit in response to provider referral for pharmacy case management and/or care coordination services.   Consent to Services:  The patient was given information about Chronic Care Management services, agreed to services, and gave verbal consent prior to initiation of services.  Please see initial visit note for detailed documentation.   Patient Care Team: [name], DO as PCP - General (Family Medicine) Rico Junker, RN as Registered Nurse Theodore Demark, RN as Registered Nurse Tennile Styles, Virl Diamond, RPH-CPP as Pharmacist  Recent office visits: Telemedicine visit with PCP on 5/20 for COVID-19 virus infection  Hospital visits: None in previous 6 months  Objective:  Lab Results  Component Value Date   CREATININE 0.95 10/06/2019   CREATININE 0.90 12/09/2017       Component Value Date/Time   CHOL 219 (H) 10/06/2019 1002   TRIG 188 (H) 10/06/2019 1002   HDL 40 (L) 10/06/2019 1002   CHOLHDL 5.5 (H) 10/06/2019 1002   LDLCALC 147 (H) 10/06/2019 1002    Hepatic Function Latest Ref Rng & Units 10/06/2019  Total Protein 6.1 - 8.1 g/dL 7.0  AST 10 - 35 U/L 14  ALT 6 - 29 U/L 10  Total Bilirubin 0.2 - 1.2 mg/dL 0.4    Lab Results  Component Value Date/Time   TSH 4.01 10/06/2019 10:02 AM   TSH 3.764 12/10/2017 04:49 AM    CBC Latest Ref Rng & Units 10/06/2019 12/09/2017  WBC 3.8 - 10.8 Thousand/uL 6.9 13.5(H)  Hemoglobin 11.7 - 15.5 g/dL 13.9 12.6  Hematocrit 35.0 - 45.0 % 43.1 36.1  Platelets 140 - 400 Thousand/uL 226 236    Clinical ASCVD: No  The 10-year ASCVD risk score Mikey Bussing  DC Jr., et al., 2013) is: 10.6%   Values used to calculate the score:     Age: 50 years     Sex: Female     Is Non-Hispanic African American: No     Diabetic: No     Tobacco smoker: Yes     Systolic Blood Pressure: 782 mmHg     Is BP treated: No     HDL Cholesterol: 40 mg/dL     Total Cholesterol: 219 mg/dL      Social History   Tobacco Use  Smoking Status Current Every Day Smoker  . Packs/day: 0.50  . Types: Cigarettes  Smokeless Tobacco Never Used   BP Readings from Last 3 Encounters:  05/22/20 119/71  04/06/20 (!) 155/92  02/11/20 (!) 142/80   Pulse Readings from Last 3 Encounters:  05/22/20 82  04/06/20 88  02/11/20 77   Wt Readings from Last 3 Encounters:  09/15/20 167 lb (75.8 kg)  05/22/20 182 lb 3.2 oz (82.6 kg)  04/06/20 181 lb (82.1 kg)    Assessment: Review of patient past medical history, allergies, medications, health status, including review of consultants reports, laboratory and other test data, was performed as part of comprehensive evaluation and provision of chronic care management services.   SDOH:  (Social Determinants of Health) assessments and interventions performed: yes SDOH Interventions   Flowsheet Row Most Recent Value  SDOH Interventions   SDOH Interventions for the Following Domains Tobacco  Tobacco Interventions Other (Comment)  [Counseled on smoking cessation]      CCM Care Plan  Allergies  Allergen Reactions  . Sulfa Antibiotics     Reports had reaction ~20 years ago  . Penicillins Rash and Other (See Comments)    Has patient had a PCN reaction causing immediate rash, facial/tongue/throat swelling, SOB or lightheadedness with hypotension: No Has patient had a PCN reaction causing severe rash involving mucus membranes or skin necrosis: No Has patient had a PCN reaction that required hospitalization: No Has patient had a PCN reaction occurring within the last 10 years: Yes If all of the above answers are "NO", then may proceed  with Cephalosporin use.     Medications Reviewed Today    Reviewed by Vella Raring, RPH-CPP (Pharmacist) on 09/27/20 at 1054  Med List Status: <None>  Medication Order Taking? Sig Documenting Provider Last Dose Status Informant  albuterol (ACCUNEB) 0.63 MG/3ML nebulizer solution [id number] Yes Take 1 ampule by nebulization every 6 (six) hours as needed for wheezing. [provider] Taking Active   atorvastatin (LIPITOR) 40 MG tablet [id number] Yes Take 1 tablet (40 mg total) by mouth daily. [name], DO Taking Active   BREZTRI AEROSPHERE 160-9-4.8 MCG/ACT AERO [id number] Yes INHALE 2 PUFFS BY MOUTH INTO THE LUNGS IN THE MORNING AND AT BEDTIME Kathrine Haddock, NP Taking Active   butalbital-acetaminophen-caffeine (FIORICET, ESGIC) 50-325-40 MG tablet [id number]  Take 1 tablet by mouth every 6 (six) hours as needed for headache.  [provider]  Active Self  diclofenac Sodium (VOLTAREN) 1 % GEL [id number]  Apply topically daily as needed. [provider]  Active   gabapentin (NEURONTIN) 400 MG capsule [id number]  TAKE 1 CAPSULE BY MOUTH 4 TIMES DAILY Karamalegos, Devonne Doughty, DO  Active   hydrOXYzine (VISTARIL) 25 MG capsule [id number]  Take 1 capsule by mouth daily as needed. [provider]  Active   levothyroxine (SYNTHROID) 200 MCG tablet [id number] Yes Take 1 tablet (200 mcg total) by mouth daily before breakfast. [name], DO Taking Active   meloxicam (MOBIC) 15 MG tablet [id number]  Take 1 tablet (15 mg total) by mouth daily as needed for pain. Karamalegos, Devonne Doughty, DO  Active   montelukast (SINGULAIR) 10 MG tablet [id number]  Take 1 tablet (10 mg total) by mouth daily. Karamalegos, Devonne Doughty, DO  Active   oxycodone (OXY-IR) 5 MG capsule [id number]  Take 5 mg by mouth every 4 (four) hours as needed. 1-2 tablets every 6 hours as needed [provider]  Active   polyethylene glycol (MIRALAX / GLYCOLAX) 17 g packet  [id number]  Take 17 g by mouth daily as needed. [provider]  Active   PROAIR HFA 108 [phone] Base) MCG/ACT inhaler [id number] Yes INHALE 1 TO 2 PUFFS INTO THE LUNGS EVERY6 HOURS AS NEEDED FOR WHEEZING OR SHORTNESS OF BREATH [name], DO Taking Active   QUEtiapine (SEROQUEL) 100 MG tablet [id number]  TAKE 1.5 TABLETS (150 MG TOTAL) BY MOUTHAT BEDTIME [name], DO  Active           Patient Active Problem List   Diagnosis Date Noted  . Abdominal pain, generalized   . Heartburn   . Positive colorectal cancer screening using Cologuard test   . Old tear of meniscus of right knee 11/09/2019  . Mixed hyperlipidemia 10/06/2019  . Essential hypertension 10/06/2019  . Osteoarthritis of spine with  radiculopathy, cervical region 10/06/2019  . Chronic pain syndrome 10/06/2019  . Major depressive disorder, recurrent, in partial remission ([place]) 07/09/2019  . Psychophysiological insomnia 07/09/2019  . GAD (generalized anxiety disorder) 07/09/2019  . Primary osteoarthritis involving multiple joints 07/09/2019  . Chronic pain of right knee 07/09/2019  . Chronic bilateral low back pain without sciatica 07/09/2019  . Centrilobular emphysema ([place]) 07/09/2019  . Obesity 07/16/2017  . Hypothyroidism 07/16/2017  . Smoker 07/16/2017  . Plantar fasciitis, bilateral 07/16/2017    Immunization History  Administered Date(s) Administered  . Influenza,inj,Quad PF,6+ Mos 01/04/2017, 12/31/2018  . Influenza-Unspecified 02/09/2020  . PFIZER(Purple Top)SARS-COV-2 Vaccination 07/15/2019, 08/11/2019, 02/21/2020  . Pneumococcal Polysaccharide-23 12/11/2017    Conditions to be addressed/monitored: COPD, hypothyroidism, HTN, HLD  Care Plan : PharmD - Medication Assistance  Updates made by Vella Raring, RPH-CPP since 09/27/2020 12:00 AM    Problem: Disease Progression     Long-Range Goal: Disease Progression Prevented or Minimized   Start Date: 06/23/2020  Expected  End Date: 09/21/2020  This Visit's Progress: On track  Recent Progress: On track  Priority: High  Note:   Current Barriers:  . Unable to independently afford treatment regimen o Reports difficulty with affording copayment for Breztri inhaler through [place] 1 o Patient APPROVED for patient assistance for Breztri through [place] through 04/28/2021  Pharmacist Clinical Goal(s):  Marland Kitchen Over the next 90 days, patient will verbalize ability to afford treatment regimen through collaboration with PharmD and provider.   Interventions: . 1:1 collaboration with [name], DO regarding development and update of comprehensive plan of care as evidenced by provider attestation and co-signature . Inter-disciplinary care team collaboration (see longitudinal plan of care)  Chronic Obstructive Pulmonary Disease: . Current treatment: o Breztri inhaler - 2 puffs into lungs twice daily o Albuterol nebulizer solution/Proair inhaler as needed o On home oxygen . Confirms using maintenance, Breztri inhaler, as directed (including rinsing mouth out after each use) and using rescue, Proair, inhaler as needed . Reports breathing not back to baseline since COVID-19 infection. Reports having shortness of breath throughout the day, needing albuterol > every 6 hours, having productive cough/congestion o Advise patient to call office today to schedule follow up with PCP  o Will send message to PCP   Tobacco Abuse:  Currently smoking 5-6 cigarettes/day  Motivation: "I want to breathe more than I want to smoke"  Reports had previous successful quit attempt with Chantix, but then GI side effects with second trial  Interested in trying Chantix again and plans to discuss with PCP  Have encouraged patient to contact and provide number for [place] Quitline [phone])  Hypothyroidism:  Controlled; current treatment: levothyroxine 274mcg once daily  Again remind patient to take  levothyroxine consistently in the morning on an empty stomach, at least 30 minutes before food   Hypertension: . Current treatment: None . Reports received home upper arm BP monitor from OTC benefit . Recalls SBP ~130 when checked recently, but did not record result o Confirms received and will start using BP log . Counsel patient to monitor home BP, keep log of results, bring record with her to medical appointments and contact office if readings consistently >140/90 or for any symptoms   Hyperlipidemia  Uncontrolled; current treatment: atorvastatin 40 mg daily  Counseled patient on rational for/importance of adherence to atorvastatin/LDL lowering   Medication Adherence: . Encourage patient to use weekly pillbox for scheduled medications as adherence aid  Patient Goals/Self-Care Activities . Over the  next 90 days, patient will:  - take medications as prescribed - focus on medication adherence by using weekly pillbox - collaborate with provider on medication access solutions  Follow Up Plan: Will keep telephone follow up appointment with care management team member as previously scheduled for: 7/27 at 11:15 am     Medication Assistance: Patient APPROVED for patient assistance for Breztri through [place] through 04/28/2021  Patient's preferred pharmacy is:  [place], [place] - [address] [place] [address] Phone: [phone] Fax: [phone]   Follow Up:  Patient agrees to Care Plan and Follow-up.  Plan: Telephone follow up appointment with care management team member scheduled for:  7/27 at 11:15 am  Harlow Asa, PharmD, Para [place], [place] [phone]

## 2020-09-27 NOTE — Patient Instructions (Signed)
Visit Information  PATIENT GOALS: Goals Addressed            This Visit's Progress   . Pharmacy Goals       Please check your home blood pressure, keep a log of the results and bring this with you to your medical appointments  Please use your weekly pillbox for organizing your medications  Please consider calling the [place] Quitline for their help with quitting smoking.  The [place] Quitline phone number is: [phone]  Feel free to call me with any questions or concerns. I look forward to our next call!  Harlow Asa, PharmD, [place] [phone]       The patient verbalized understanding of instructions, educational materials, and care plan provided today and declined offer to receive copy of patient instructions, educational materials, and care plan.   Telephone follow up appointment with care management team member scheduled for: 7/27 at 11:15 am

## 2020-10-02 ENCOUNTER — Telehealth: Payer: Medicare Other | Admitting: Family Medicine

## 2020-10-16 ENCOUNTER — Other Ambulatory Visit: Payer: Self-pay

## 2020-10-16 ENCOUNTER — Other Ambulatory Visit: Payer: Medicare Other

## 2020-10-16 DIAGNOSIS — F5104 Psychophysiologic insomnia: Secondary | ICD-10-CM

## 2020-10-16 DIAGNOSIS — J432 Centrilobular emphysema: Secondary | ICD-10-CM

## 2020-10-16 DIAGNOSIS — E039 Hypothyroidism, unspecified: Secondary | ICD-10-CM | POA: Diagnosis not present

## 2020-10-16 DIAGNOSIS — R7309 Other abnormal glucose: Secondary | ICD-10-CM

## 2020-10-16 DIAGNOSIS — Z0000 Encounter for general adult medical examination without abnormal findings: Secondary | ICD-10-CM

## 2020-10-16 DIAGNOSIS — F3341 Major depressive disorder, recurrent, in partial remission: Secondary | ICD-10-CM

## 2020-10-16 DIAGNOSIS — I10 Essential (primary) hypertension: Secondary | ICD-10-CM

## 2020-10-17 DIAGNOSIS — J449 Chronic obstructive pulmonary disease, unspecified: Secondary | ICD-10-CM | POA: Diagnosis not present

## 2020-10-17 LAB — CBC WITH DIFFERENTIAL/PLATELET
Absolute Monocytes: 446 cells/uL (ref 200–950)
Basophils Absolute: 101 cells/uL (ref 0–200)
Basophils Relative: 1.4 %
Eosinophils Absolute: 202 cells/uL (ref 15–500)
Eosinophils Relative: 2.8 %
HCT: 39.5 % (ref 35.0–45.0)
Hemoglobin: 12.3 g/dL (ref 11.7–15.5)
Lymphs Abs: 2714 cells/uL (ref 850–3900)
MCH: 26.2 pg — ABNORMAL LOW (ref 27.0–33.0)
MCHC: 31.1 g/dL — ABNORMAL LOW (ref 32.0–36.0)
MCV: 84 fL (ref 80.0–100.0)
MPV: 9.7 fL (ref 7.5–12.5)
Monocytes Relative: 6.2 %
Neutro Abs: 3737 cells/uL (ref 1500–7800)
Neutrophils Relative %: 51.9 %
Platelets: 311 10*3/uL (ref 140–400)
RBC: 4.7 10*6/uL (ref 3.80–5.10)
RDW: 15.7 % — ABNORMAL HIGH (ref 11.0–15.0)
Total Lymphocyte: 37.7 %
WBC: 7.2 10*3/uL (ref 3.8–10.8)

## 2020-10-17 LAB — COMPLETE METABOLIC PANEL WITH GFR
AG Ratio: 1.5 (calc) (ref 1.0–2.5)
ALT: 9 U/L (ref 6–29)
AST: 17 U/L (ref 10–35)
Albumin: 4.3 g/dL (ref 3.6–5.1)
Alkaline phosphatase (APISO): 91 U/L (ref 37–153)
BUN: 9 mg/dL (ref 7–25)
CO2: 30 mmol/L (ref 20–32)
Calcium: 9.8 mg/dL (ref 8.6–10.4)
Chloride: 102 mmol/L (ref 98–110)
Creat: 0.93 mg/dL (ref 0.50–0.99)
GFR, Est African American: 75 mL/min/{1.73_m2} (ref >=60)
GFR, Est Non African American: 64 mL/min/{1.73_m2} (ref >=60)
Globulin: 2.8 g/dL (calc) (ref 1.9–3.7)
Glucose, Bld: 92 mg/dL (ref 65–99)
Potassium: 4.5 mmol/L (ref 3.5–5.3)
Sodium: 139 mmol/L (ref 135–146)
Total Bilirubin: 0.6 mg/dL (ref 0.2–1.2)
Total Protein: 7.1 g/dL (ref 6.1–8.1)

## 2020-10-17 LAB — LIPID PANEL
Cholesterol: 201 mg/dL — ABNORMAL HIGH (ref <200)
HDL: 47 mg/dL — ABNORMAL LOW (ref >=50)
LDL Cholesterol (Calc): 129 mg/dL (calc) — ABNORMAL HIGH
Non-HDL Cholesterol (Calc): 154 mg/dL (calc) — ABNORMAL HIGH (ref <130)
Total CHOL/HDL Ratio: 4.3 (calc) (ref <5.0)
Triglycerides: 130 mg/dL (ref <150)

## 2020-10-17 LAB — HEMOGLOBIN A1C
Hgb A1c MFr Bld: 5.6 % of total Hgb (ref <5.7)
Mean Plasma Glucose: 114 mg/dL
eAG (mmol/L): 6.3 mmol/L

## 2020-10-17 LAB — TSH: TSH: 3.37 mIU/L (ref 0.40–4.50)

## 2020-10-23 ENCOUNTER — Other Ambulatory Visit: Payer: Self-pay | Admitting: Family Medicine

## 2020-10-23 ENCOUNTER — Other Ambulatory Visit: Payer: Self-pay

## 2020-10-23 ENCOUNTER — Encounter: Payer: Self-pay | Admitting: Family Medicine

## 2020-10-23 ENCOUNTER — Ambulatory Visit (INDEPENDENT_AMBULATORY_CARE_PROVIDER_SITE_OTHER): Payer: Medicare Other | Admitting: Family Medicine

## 2020-10-23 VITALS — BP 135/76 | HR 85 | Ht 64.0 in | Wt 166.4 lb

## 2020-10-23 DIAGNOSIS — F17210 Nicotine dependence, cigarettes, uncomplicated: Secondary | ICD-10-CM | POA: Diagnosis not present

## 2020-10-23 DIAGNOSIS — Z0000 Encounter for general adult medical examination without abnormal findings: Secondary | ICD-10-CM

## 2020-10-23 DIAGNOSIS — H65192 Other acute nonsuppurative otitis media, left ear: Secondary | ICD-10-CM

## 2020-10-23 DIAGNOSIS — F17200 Nicotine dependence, unspecified, uncomplicated: Secondary | ICD-10-CM | POA: Diagnosis not present

## 2020-10-23 DIAGNOSIS — E039 Hypothyroidism, unspecified: Secondary | ICD-10-CM | POA: Diagnosis not present

## 2020-10-23 DIAGNOSIS — Z23 Encounter for immunization: Secondary | ICD-10-CM

## 2020-10-23 MED ORDER — PREDNISONE 20 MG PO TABS: 20.0000 mg | ORAL_TABLET | Freq: Every day | ORAL | 0 refills | Status: DC

## 2020-10-23 MED ORDER — VARENICLINE TARTRATE 0.5 MG X 11 & 1 MG X 42 PO MISC: ORAL | 0 refills | Status: DC

## 2020-10-23 NOTE — Progress Notes (Signed)
Subjective:    Patient ID: Audrey Campbell, female    DOB: [birth year], 65 y.o.   MRN: [id number]  Audrey Campbell is a 65 y.o. female presenting on 10/23/2020 for Annual Exam   HPI  Here for Annual Physical and Lab Review.  Wellness Lab review Controlled cholesterol. A1c in appropriate range  COPD Tobacco Abuse On Breztri History of COVID 08/2020 She has been vaccinated with Pfizer x 2 doses and then 1st booster [employer] 01/2020. Ready to quit smoking wants to try Chantix.  Additional concerns:  Weight Loss / Early Satiety She admits she has a good appetite but not as interested in eating regularly. She may have 2-3 bites and stop eating. Noticed this problem worse Within the past 4-6 months Had covid 08/2020 worsened taste but seems problem was before. Down 15 lbs in 5 months but only 0.5 lb in 1 month Denies nausea vomiting abdomen pain, dark stool blood in stool  Ear effusion Left Ear Pain Dizziness vertigo after covid Still has L ear deeper burning pain and pressure with popping  Due prevnar20 today initial pneumonia vaccine age 85+   Depression screen [facility] 2/9 10/23/2020 05/22/2020 04/06/2020  Decreased Interest 1 1 0  Down, Depressed, Hopeless 0 1 0  PHQ - 2 Score 1 2 0  Altered sleeping 0 0 -  Tired, decreased energy 2 1 -  Change in appetite 2 0 -  Feeling bad or failure about yourself  0 0 -  Trouble concentrating 0 0 -  Moving slowly or fidgety/restless 0 0 -  Suicidal thoughts 0 0 -  PHQ-9 Score 5 3 -  Difficult doing work/chores Somewhat difficult Not difficult at all -    Past Medical History:  Diagnosis Date   Anxiety    Arthritis    COPD (chronic obstructive pulmonary disease) ([place])    Hyperlipidemia    Hypertension    Hypothyroidism    On home oxygen therapy    Plantar fasciitis    Past Surgical History:  Procedure Laterality Date   bulging disc in the neck     COLONOSCOPY WITH PROPOFOL N/A 12/29/2019   Procedure: COLONOSCOPY WITH  PROPOFOL;  Surgeon: Virgel Manifold, MD;  Location: [facility] ENDOSCOPY;  Service: Endoscopy;  Laterality: N/A;   DILATION AND CURETTAGE OF UTERUS     ESOPHAGOGASTRODUODENOSCOPY (EGD) WITH PROPOFOL N/A 12/29/2019   Procedure: ESOPHAGOGASTRODUODENOSCOPY (EGD) WITH PROPOFOL;  Surgeon: Virgel Manifold, MD;  Location: [facility] ENDOSCOPY;  Service: Endoscopy;  Laterality: N/A;   FOOT SURGERY Bilateral    heel  fasciitis     RHINOPLASTY     SHOULDER SURGERY     TUBAL LIGATION     Social History   Socioeconomic History   Marital status: Widowed    Spouse name: Not on file   Number of children: Not on file   Years of education: high school   Highest education level: High school graduate  Occupational History   Not on file  Tobacco Use   Smoking status: Every Day    Packs/day: 0.50    Pack years: 0.00    Types: Cigarettes   Smokeless tobacco: Never  Vaping Use   Vaping Use: Never used  Substance and Sexual Activity   Alcohol use: No   Drug use: No   Sexual activity: Yes    Birth control/protection: None  Other Topics Concern   Not on file  Social History Narrative   Not on file   Social Determinants of  Health   Financial Resource Strain: Not on file  Food Insecurity: Not on file  Transportation Needs: Not on file  Physical Activity: Not on file  Stress: Not on file  Social Connections: Not on file  Intimate Partner Violence: Not on file   Family History  Problem Relation Age of Onset   Diabetes Father    Stroke Father 66   Thyroid disease Father    Seizures Sister    Breast cancer Neg Hx    Current Outpatient Medications on File Prior to Visit  Medication Sig   albuterol (ACCUNEB) 0.63 MG/3ML nebulizer solution Take 1 ampule by nebulization every 6 (six) hours as needed for wheezing.   atorvastatin (LIPITOR) 40 MG tablet Take 1 tablet (40 mg total) by mouth daily.   BREZTRI AEROSPHERE 160-9-4.8 MCG/ACT AERO INHALE 2 PUFFS BY MOUTH INTO THE LUNGS IN THE MORNING AND AT  BEDTIME   butalbital-acetaminophen-caffeine (FIORICET, ESGIC) 50-325-40 MG tablet Take 1 tablet by mouth every 6 (six) hours as needed for headache.    diclofenac Sodium (VOLTAREN) 1 % GEL Apply topically daily as needed.   gabapentin (NEURONTIN) 400 MG capsule TAKE 1 CAPSULE BY MOUTH 4 TIMES DAILY   hydrOXYzine (VISTARIL) 25 MG capsule Take 1 capsule by mouth daily as needed.   levothyroxine (SYNTHROID) 200 MCG tablet Take 1 tablet (200 mcg total) by mouth daily before breakfast.   meloxicam (MOBIC) 15 MG tablet Take 1 tablet (15 mg total) by mouth daily as needed for pain.   montelukast (SINGULAIR) 10 MG tablet Take 1 tablet (10 mg total) by mouth daily.   oxycodone (OXY-IR) 5 MG capsule Take 5 mg by mouth every 4 (four) hours as needed. 1-2 tablets every 6 hours as needed   polyethylene glycol (MIRALAX / GLYCOLAX) 17 g packet Take 17 g by mouth daily as needed.   PROAIR HFA 108 (90 Base) MCG/ACT inhaler INHALE 1 TO 2 PUFFS INTO THE LUNGS EVERY6 HOURS AS NEEDED FOR WHEEZING OR SHORTNESS OF BREATH   QUEtiapine (SEROQUEL) 100 MG tablet TAKE 1.5 TABLETS (150 MG TOTAL) BY MOUTHAT BEDTIME   No current facility-administered medications on file prior to visit.    Review of Systems  Constitutional:  Negative for activity change, appetite change, chills, diaphoresis, fatigue and fever.  HENT:  Positive for ear pain. Negative for congestion and hearing loss.   Eyes:  Negative for visual disturbance.  Respiratory:  Negative for cough, chest tightness, shortness of breath and wheezing.   Cardiovascular:  Negative for chest pain, palpitations and leg swelling.  Gastrointestinal:  Negative for abdominal pain, constipation, diarrhea, nausea and vomiting.  Genitourinary:  Negative for dysuria, frequency and hematuria.  Musculoskeletal:  Negative for arthralgias and neck pain.  Skin:  Negative for rash.  Neurological:  Negative for dizziness, weakness, light-headedness, numbness and headaches.   Hematological:  Negative for adenopathy.  Psychiatric/Behavioral:  Negative for behavioral problems, dysphoric mood and sleep disturbance.   Per HPI unless specifically indicated above      Objective:    BP 135/76   Pulse 85   Ht 5\' 4"  (1.626 m)   Wt 166 lb 6.4 oz (75.5 kg)   SpO2 96%   BMI 28.56 kg/m   Wt Readings from Last 3 Encounters:  10/23/20 166 lb 6.4 oz (75.5 kg)  09/15/20 167 lb (75.8 kg)  05/22/20 182 lb 3.2 oz (82.6 kg)    Physical Exam Vitals and nursing note reviewed.  Constitutional:      General: She  is not in acute distress.    Appearance: She is well-developed. She is not diaphoretic.     Comments: Well-appearing, comfortable, cooperative  HENT:     Head: Normocephalic and atraumatic.     Right Ear: Tympanic membrane, ear canal and external ear normal. There is no impacted cerumen.     Left Ear: Ear canal and external ear normal. There is no impacted cerumen.     Ears:     Comments: L TM with mostly clear slightly cloudy effusion and some pressure mild bulging. Eyes:     General:        Right eye: No discharge.        Left eye: No discharge.     Conjunctiva/sclera: Conjunctivae normal.     Pupils: Pupils are equal, round, and reactive to light.  Neck:     Thyroid: No thyromegaly.  Cardiovascular:     Rate and Rhythm: Normal rate and regular rhythm.     Pulses: Normal pulses.     Heart sounds: Normal heart sounds. No murmur heard. Pulmonary:     Effort: Pulmonary effort is normal. No respiratory distress.     Breath sounds: Normal breath sounds. No wheezing or rales.  Abdominal:     General: Bowel sounds are normal. There is no distension.     Palpations: Abdomen is soft. There is no mass.     Tenderness: There is no abdominal tenderness.  Musculoskeletal:        General: No tenderness. Normal range of motion.     Cervical back: Normal range of motion and neck supple.     Comments: Upper / Lower Extremities: - Normal muscle tone, strength  bilateral upper extremities 5/5, lower extremities 5/5  Lymphadenopathy:     Cervical: No cervical adenopathy.  Skin:    General: Skin is warm and dry.     Findings: No erythema or rash.  Neurological:     Mental Status: She is alert and oriented to person, place, and time.     Comments: Distal sensation intact to light touch all extremities  Psychiatric:        Mood and Affect: Mood normal.        Behavior: Behavior normal.        Thought Content: Thought content normal.     Comments: Well groomed, good eye contact, normal speech and thoughts    Results for orders placed or performed in visit on 10/16/20  TSH  Result Value Ref Range   TSH 3.37 0.40 - 4.50 mIU/L  Lipid panel  Result Value Ref Range   Cholesterol 201 (H) <200 mg/dL   HDL 47 (L) > OR = 50 mg/dL   Triglycerides 130 <150 mg/dL   LDL Cholesterol (Calc) 129 (H) mg/dL (calc)   Total CHOL/HDL Ratio 4.3 <5.0 (calc)   Non-HDL Cholesterol (Calc) 154 (H) <130 mg/dL (calc)  COMPLETE METABOLIC PANEL WITH GFR  Result Value Ref Range   Glucose, Bld 92 65 - 99 mg/dL   BUN 9 7 - 25 mg/dL   Creat 0.93 0.50 - 0.99 mg/dL   GFR, Est Non African American 64 > OR = 60 mL/min/1.25m2   GFR, Est African American 75 > OR = 60 mL/min/1.49m2   BUN/Creatinine Ratio NOT APPLICABLE 6 - 22 (calc)   Sodium 139 135 - 146 mmol/L   Potassium 4.5 3.5 - 5.3 mmol/L   Chloride 102 98 - 110 mmol/L   CO2 30 20 - 32 mmol/L   Calcium  9.8 8.6 - 10.4 mg/dL   Total Protein 7.1 6.1 - 8.1 g/dL   Albumin 4.3 3.6 - 5.1 g/dL   Globulin 2.8 1.9 - 3.7 g/dL (calc)   AG Ratio 1.5 1.0 - 2.5 (calc)   Total Bilirubin 0.6 0.2 - 1.2 mg/dL   Alkaline phosphatase (APISO) 91 37 - 153 U/L   AST 17 10 - 35 U/L   ALT 9 6 - 29 U/L  CBC with Differential/Platelet  Result Value Ref Range   WBC 7.2 3.8 - 10.8 Thousand/uL   RBC 4.70 3.80 - 5.10 Million/uL   Hemoglobin 12.3 11.7 - 15.5 g/dL   HCT 39.5 35.0 - 45.0 %   MCV 84.0 80.0 - 100.0 fL   MCH 26.2 (L) 27.0 - 33.0  pg   MCHC 31.1 (L) 32.0 - 36.0 g/dL   RDW 15.7 (H) 11.0 - 15.0 %   Platelets 311 140 - 400 Thousand/uL   MPV 9.7 7.5 - 12.5 fL   Neutro Abs 3,737 1,500 - 7,800 cells/uL   Lymphs Abs 2,714 850 - 3,900 cells/uL   Absolute Monocytes 446 200 - 950 cells/uL   Eosinophils Absolute 202 15 - 500 cells/uL   Basophils Absolute 101 0 - 200 cells/uL   Neutrophils Relative % 51.9 %   Total Lymphocyte 37.7 %   Monocytes Relative 6.2 %   Eosinophils Relative 2.8 %   Basophils Relative 1.4 %  Hemoglobin A1c  Result Value Ref Range   Hgb A1c MFr Bld 5.6 <5.7 % of total Hgb   Mean Plasma Glucose 114 mg/dL   eAG (mmol/L) 6.3 mmol/L      Assessment & Plan:   Problem List Items Addressed This Visit     Smoker   Relevant Medications   varenicline (CHANTIX PAK) 0.5 MG X 11 & 1 MG X 42 tablet   Hypothyroidism   Other Visit Diagnoses     Annual physical exam    -  Primary   Need for vaccination for pneumococcus       Relevant Orders   Pneumococcal conjugate vaccine 20-valent (Completed)        Updated Health Maintenance information Prevnar20, then 1 year Pneumovax-23 Reviewed recent lab results with patient Encouraged improvement to lifestyle with diet and exercise Goal maintain healthy weight  Smoking Discussion today >5  minutes (<10 minutes) specifically on counseling on risks of tobacco use, complications, treatment, smoking cessation.  Rx Chantix, handout per AVS F/u 1 month  Weight Loss Unintentional / Early Satiety Likely due to chronic smoking, history covid Seems stable in past 1 month, directly related to poor PO intake and appetite, not having any other concerning symptoms or GI red flags Trial higher protein foods and Ensure boost supplement 1-2 times daily F/u 1 month  L Ear Effusion / Pain Restart Flonase Add prednisone 20mg  low dose 1-2 per day 3-5 days for pain and pressure   Meds ordered this encounter  Medications   varenicline (CHANTIX PAK) 0.5 MG X 11 & 1  MG X 42 tablet    Sig: Take one 0.5 mg tab by mouth once daily for 3 days, increase to one 0.5 mg twice daily for 4 days, then increase to one 1 mg twice daily.    Dispense:  53 tablet    Refill:  0      Follow up plan: Return in about 4 weeks (around 11/20/2020) for 4 week follow-up Weight loss/reduced appetite, Smoking, Left Ear.  Nobie Putnam, DO [facility] 10/23/2020, 1:55 PM

## 2020-10-23 NOTE — Patient Instructions (Addendum)
Thank you for coming to the office today.  I think mostly the weight loss is due to reduced nutrition at this time from several factors, smoking is the number 1 cause and also Covid is a factor.  Try to add Ensure protein supplement shake 1-2 times a day, and inc high protein foods for few bites if you are consuming only small amount  Follow-up 4-6 weeks to repeat weight.   Prevnar20 pneumonia vaccine at age 65 today, repeat other vaccine dose in 1 year.  Varenicline - Chantix Dosing: Duration Dosage  Initial 3 days  0.5mg  daily  Days 4-7 0.5mg  twice daily  Day 8 through the end of therapy  1mg  twice daily   Prescribing Instructions: Use of "Chantix - Starting Month Pak" and "Chantix - Continuing Month Pak" provide easy to use blister packaging.  Black Box Warning: Mood Change -"Serious neuropsychiatric events have been reported." Stop Drug and contact health care provider if patient. "agitation, hostility, depressed mood or changes in thinking or behavior that are not typical for the patient are observed, or if the patient develops suicidal ideation / behavior." Document potential mood change.  Patients can continue to smoke while initiating varenicline. A "target quit date" should be set on calendar approximately Day 8 to max Day 30 (of the treatment) - PREFERRED QUIT DATE - 1 to 2 weeks from start of medicine. Advise to take this medication WITH food.  Minimizing the nausea (typically mild and transient).  If nausea occurs, consider dose reduction or temporary cessation of treatment.   The treatment should be continued for 12 weeks.  An additional 12 weeks of therapy can be used at the prescribers discretion.   Chantix can change the way people react to alcohol (decreased tolerance, increased drunkenness, unusual or aggressive behavior, memory loss) Seizures occurred in patients that had no history of seizures and in patients that had a seizure disorder that had been well controlled   Discuss Cardiovascular Safety   Please schedule a Follow-up Appointment to: Return in about 4 weeks (around 11/20/2020) for 4 week follow-up Weight loss/reduced appetite, Smoking, Left Ear.  If you have any other questions or concerns, please feel free to call the office or send a message through [place]. You may also schedule an earlier appointment if necessary.  Additionally, you may be receiving a survey about your experience at our office within a few days to 1 week by e-mail or mail. We value your feedback.  Nobie Putnam, DO [place]

## 2020-10-27 DIAGNOSIS — H35363 Drusen (degenerative) of macula, bilateral: Secondary | ICD-10-CM | POA: Diagnosis not present

## 2020-10-27 DIAGNOSIS — H524 Presbyopia: Secondary | ICD-10-CM | POA: Diagnosis not present

## 2020-10-27 DIAGNOSIS — H10233 Serous conjunctivitis, except viral, bilateral: Secondary | ICD-10-CM | POA: Diagnosis not present

## 2020-10-27 DIAGNOSIS — H5203 Hypermetropia, bilateral: Secondary | ICD-10-CM | POA: Diagnosis not present

## 2020-10-27 DIAGNOSIS — H52223 Regular astigmatism, bilateral: Secondary | ICD-10-CM | POA: Diagnosis not present

## 2020-10-27 DIAGNOSIS — H2513 Age-related nuclear cataract, bilateral: Secondary | ICD-10-CM | POA: Diagnosis not present

## 2020-11-02 DIAGNOSIS — M542 Cervicalgia: Secondary | ICD-10-CM | POA: Diagnosis not present

## 2020-11-02 DIAGNOSIS — M722 Plantar fascial fibromatosis: Secondary | ICD-10-CM | POA: Diagnosis not present

## 2020-11-02 DIAGNOSIS — M25569 Pain in unspecified knee: Secondary | ICD-10-CM | POA: Diagnosis not present

## 2020-11-02 DIAGNOSIS — Z79899 Other long term (current) drug therapy: Secondary | ICD-10-CM | POA: Diagnosis not present

## 2020-11-02 DIAGNOSIS — G894 Chronic pain syndrome: Secondary | ICD-10-CM | POA: Diagnosis not present

## 2020-11-02 DIAGNOSIS — Z79891 Long term (current) use of opiate analgesic: Secondary | ICD-10-CM | POA: Diagnosis not present

## 2020-11-02 DIAGNOSIS — M25519 Pain in unspecified shoulder: Secondary | ICD-10-CM | POA: Diagnosis not present

## 2020-11-16 DIAGNOSIS — J449 Chronic obstructive pulmonary disease, unspecified: Secondary | ICD-10-CM | POA: Diagnosis not present

## 2020-11-21 ENCOUNTER — Encounter: Payer: Self-pay | Admitting: Family Medicine

## 2020-11-21 ENCOUNTER — Other Ambulatory Visit: Payer: Self-pay

## 2020-11-21 ENCOUNTER — Ambulatory Visit (INDEPENDENT_AMBULATORY_CARE_PROVIDER_SITE_OTHER): Payer: Medicare Other | Admitting: Family Medicine

## 2020-11-21 VITALS — BP 115/72 | HR 81 | Ht 64.0 in | Wt 166.4 lb

## 2020-11-21 DIAGNOSIS — R634 Abnormal weight loss: Secondary | ICD-10-CM

## 2020-11-21 DIAGNOSIS — F17200 Nicotine dependence, unspecified, uncomplicated: Secondary | ICD-10-CM | POA: Diagnosis not present

## 2020-11-21 DIAGNOSIS — H6982 Other specified disorders of Eustachian tube, left ear: Secondary | ICD-10-CM

## 2020-11-21 DIAGNOSIS — J432 Centrilobular emphysema: Secondary | ICD-10-CM

## 2020-11-21 MED ORDER — FLUTICASONE PROPIONATE 50 MCG/ACT NA SUSP: 2.0000 | Freq: Every day | NASAL | 3 refills | Status: DC

## 2020-11-21 NOTE — Progress Notes (Signed)
Subjective:    Patient ID: AMBI FEJES, female    DOB: 18-Jul-1955, 65 y.o.   MRN: [id number]  Audrey Campbell is a 65 y.o. female presenting on 11/21/2020 for Weight Check   HPI  Weight Loss / Early Satiety Follow-up interval 1 month later, she was doing Ensure supplement. Today she feels much better, has better appetite, drinking at least 1 Ensure per day with good results, gained 2-3 lbs on her own scale, not losing weight. No longer endorsing early satiety. Appetite improved. Denies nausea vomiting abdomen pain, dark stool blood in stool  COPD Tobacco Abuse On Breztri History of COVID 08/2020 Last visit 10/23/20 - Ready to quit smoking wants to try Chantix. She tried this for few weeks, quit smoking for 6 days and then resumed smoking, said cig tasted nasty. She tried more chantix but unsuccessful. Not tried any therapy or quitline. Still smoking.  Left Ear Effusion Improved symptoms and pain She has Flonase, using it regularly.   Depression screen [facility] 2/9 11/21/2020 10/23/2020 05/22/2020  Decreased Interest 0 1 1  Down, Depressed, Hopeless 0 0 1  PHQ - 2 Score 0 1 2  Altered sleeping 0 0 0  Tired, decreased energy 0 2 1  Change in appetite 0 2 0  Feeling bad or failure about yourself  0 0 0  Trouble concentrating 0 0 0  Moving slowly or fidgety/restless 0 0 0  Suicidal thoughts 0 0 0  PHQ-9 Score 0 5 3  Difficult doing work/chores Not difficult at all Somewhat difficult Not difficult at all    Social History   Tobacco Use   Smoking status: Every Day    Packs/day: 0.50    Types: Cigarettes   Smokeless tobacco: Never  Vaping Use   Vaping Use: Never used  Substance Use Topics   Alcohol use: No   Drug use: No    Review of Systems Per HPI unless specifically indicated above     Objective:    BP 115/72   Pulse 81   Ht '5\' 4"'$  (1.626 m)   Wt 166 lb 6.4 oz (75.5 kg)   SpO2 95%   BMI 28.56 kg/m   Wt Readings from Last 3 Encounters:  11/21/20 166 lb  6.4 oz (75.5 kg)  10/23/20 166 lb 6.4 oz (75.5 kg)  09/15/20 167 lb (75.8 kg)    Physical Exam Vitals and nursing note reviewed.  Constitutional:      General: She is not in acute distress.    Appearance: She is well-developed. She is not diaphoretic.     Comments: Well-appearing, comfortable, cooperative  HENT:     Head: Normocephalic and atraumatic.     Ears:     Comments: Left TM has mild effusion, improved Eyes:     General:        Right eye: No discharge.        Left eye: No discharge.     Conjunctiva/sclera: Conjunctivae normal.  Neck:     Thyroid: No thyromegaly.  Cardiovascular:     Rate and Rhythm: Normal rate and regular rhythm.     Heart sounds: Normal heart sounds. No murmur heard. Pulmonary:     Effort: Pulmonary effort is normal. No respiratory distress.     Breath sounds: Normal breath sounds. No wheezing or rales.  Musculoskeletal:        General: Normal range of motion.     Cervical back: Normal range of motion and neck supple.  Lymphadenopathy:  Cervical: No cervical adenopathy.  Skin:    General: Skin is warm and dry.     Findings: No erythema or rash.  Neurological:     Mental Status: She is alert and oriented to person, place, and time.  Psychiatric:        Behavior: Behavior normal.     Comments: Well groomed, good eye contact, normal speech and thoughts   Results for orders placed or performed in visit on 10/16/20  TSH  Result Value Ref Range   TSH 3.37 0.40 - 4.50 mIU/L  Lipid panel  Result Value Ref Range   Cholesterol 201 (H) <200 mg/dL   HDL 47 (L) > OR = 50 mg/dL   Triglycerides 130 <150 mg/dL   LDL Cholesterol (Calc) 129 (H) mg/dL (calc)   Total CHOL/HDL Ratio 4.3 <5.0 (calc)   Non-HDL Cholesterol (Calc) 154 (H) <130 mg/dL (calc)  COMPLETE METABOLIC PANEL WITH GFR  Result Value Ref Range   Glucose, Bld 92 65 - 99 mg/dL   BUN 9 7 - 25 mg/dL   Creat 0.93 0.50 - 0.99 mg/dL   GFR, Est Non African American 64 > OR = 60 mL/min/1.62m    GFR, Est African American 75 > OR = 60 mL/min/1.753m  BUN/Creatinine Ratio NOT APPLICABLE 6 - 22 (calc)   Sodium 139 135 - 146 mmol/L   Potassium 4.5 3.5 - 5.3 mmol/L   Chloride 102 98 - 110 mmol/L   CO2 30 20 - 32 mmol/L   Calcium 9.8 8.6 - 10.4 mg/dL   Total Protein 7.1 6.1 - 8.1 g/dL   Albumin 4.3 3.6 - 5.1 g/dL   Globulin 2.8 1.9 - 3.7 g/dL (calc)   AG Ratio 1.5 1.0 - 2.5 (calc)   Total Bilirubin 0.6 0.2 - 1.2 mg/dL   Alkaline phosphatase (APISO) 91 37 - 153 U/L   AST 17 10 - 35 U/L   ALT 9 6 - 29 U/L  CBC with Differential/Platelet  Result Value Ref Range   WBC 7.2 3.8 - 10.8 Thousand/uL   RBC 4.70 3.80 - 5.10 Million/uL   Hemoglobin 12.3 11.7 - 15.5 g/dL   HCT 39.5 35.0 - 45.0 %   MCV 84.0 80.0 - 100.0 fL   MCH 26.2 (L) 27.0 - 33.0 pg   MCHC 31.1 (L) 32.0 - 36.0 g/dL   RDW 15.7 (H) 11.0 - 15.0 %   Platelets 311 140 - 400 Thousand/uL   MPV 9.7 7.5 - 12.5 fL   Neutro Abs 3,737 1,500 - 7,800 cells/uL   Lymphs Abs 2,714 850 - 3,900 cells/uL   Absolute Monocytes 446 200 - 950 cells/uL   Eosinophils Absolute 202 15 - 500 cells/uL   Basophils Absolute 101 0 - 200 cells/uL   Neutrophils Relative % 51.9 %   Total Lymphocyte 37.7 %   Monocytes Relative 6.2 %   Eosinophils Relative 2.8 %   Basophils Relative 1.4 %  Hemoglobin A1c  Result Value Ref Range   Hgb A1c MFr Bld 5.6 <5.7 % of total Hgb   Mean Plasma Glucose 114 mg/dL   eAG (mmol/L) 6.3 mmol/L      Assessment & Plan:   Problem List Items Addressed This Visit     Smoker   Centrilobular emphysema (HCC) - Primary   Relevant Medications   fluticasone (FLONASE) 50 MCG/ACT nasal spray   Other Visit Diagnoses     Weight loss       Acute dysfunction of left eustachian tube  Relevant Medications   fluticasone (FLONASE) 50 MCG/ACT nasal spray       Weight loss improved Now appetite improved Seems taste and appetite have returned Taking Ensure x 1 daily Weight stable to gained 1-3 lbs, some  fluctuation Encouraged continue smoking cessation in future to help appetite No further concern at this time.  Smoking COPD Emphysema Again trial smoking cessation when ready - gave her [place] Quitline to call and setup with someone for therapy CBT to help quit, failed Chantix initial round recently, we can order more trial on Chantix starter pack when ready to try again.  L dysfunctional Eustachian tube, effusion Improving, on Flonase Continue  Meds ordered this encounter  Medications   fluticasone (FLONASE) 50 MCG/ACT nasal spray    Sig: Place 2 sprays into both nostrils daily.    Dispense:  16 g    Refill:  3      Follow up plan: Return in about 6 months (around 05/24/2021) for 6 month follow-up COPD / Smoking.  Nobie Putnam, [facility] 11/21/2020, 2:13 PM

## 2020-11-21 NOTE — Patient Instructions (Addendum)
Thank you for coming to the office today.  [place] Quitline to help quit smoking.  1 800-QUIT NOW  ---------------------------------------------------------     Please schedule a Follow-up Appointment to: Return in about 6 months (around 05/24/2021) for 6 month follow-up COPD / Smoking.  If you have any other questions or concerns, please feel free to call the office or send a message through [place]. You may also schedule an earlier appointment if necessary.  Additionally, you may be receiving a survey about your experience at our office within a few days to 1 week by e-mail or mail. We value your feedback.  Nobie Putnam, DO [place]

## 2020-11-22 ENCOUNTER — Ambulatory Visit (INDEPENDENT_AMBULATORY_CARE_PROVIDER_SITE_OTHER): Payer: Medicare Other | Admitting: Pharmacist

## 2020-11-22 DIAGNOSIS — F17200 Nicotine dependence, unspecified, uncomplicated: Secondary | ICD-10-CM

## 2020-11-22 DIAGNOSIS — J432 Centrilobular emphysema: Secondary | ICD-10-CM | POA: Diagnosis not present

## 2020-11-22 DIAGNOSIS — E039 Hypothyroidism, unspecified: Secondary | ICD-10-CM

## 2020-11-22 DIAGNOSIS — I10 Essential (primary) hypertension: Secondary | ICD-10-CM | POA: Diagnosis not present

## 2020-11-22 NOTE — Patient Instructions (Signed)
Visit Information  PATIENT GOALS:  Goals Addressed             This Visit's Progress    Pharmacy Goals       Please check your home blood pressure, keep a log of the results and bring this with you to your medical appointments  Please use your weekly pillbox for organizing your medications  Please consider calling the [place] Quitline for their help with quitting smoking.  The [place] Quitline phone number is: [phone]  Feel free to call me with any questions or concerns. I look forward to our next call!  Wallace Cullens, PharmD, [place] [phone]        The patient verbalized understanding of instructions, educational materials, and care plan provided today and declined offer to receive copy of patient instructions, educational materials, and care plan.   Telephone follow up appointment with care management team member scheduled for: 12/20/2020 at 11:15 AM

## 2020-11-22 NOTE — Chronic Care Management (AMB) (Signed)
Chronic Care Management Pharmacy Note  11/22/2020 Name:  Audrey Campbell MRN:  [id number] DOB:  [birth year]   Subjective: Audrey Campbell is an 65 y.o. year old female who is a primary patient of [name], DO.  The CCM team was consulted for assistance with disease management and care coordination needs.    Engaged with patient by telephone for follow up visit in response to provider referral for pharmacy case management and/or care coordination services.   Consent to Services:  The patient was given information about Chronic Care Management services, agreed to services, and gave verbal consent prior to initiation of services.  Please see initial visit note for detailed documentation.   Patient Care Team: [name], DO as PCP - General (Family Medicine) Rico Junker, RN as Registered Nurse Theodore Demark, RN as Registered Nurse Arleene Settle, Virl Diamond, RPH-CPP as Pharmacist  Recent office visits: Office Visit with PCP on 6/27 for annual physical.  Office Visit with PCP on 7/26 for weight check/follow up    Hospital visits: None in previous 6 months  Objective:  Lab Results  Component Value Date   CREATININE 0.93 10/16/2020   CREATININE 0.95 10/06/2019   CREATININE 0.90 12/09/2017      Component Value Date/Time   CHOL 201 (H) 10/16/2020 0858   TRIG 130 10/16/2020 0858   HDL 47 (L) 10/16/2020 0858   CHOLHDL 4.3 10/16/2020 0858   LDLCALC 129 (H) 10/16/2020 0858    Hepatic Function Latest Ref Rng & Units 10/16/2020 10/06/2019  Total Protein 6.1 - 8.1 g/dL 7.1 7.0  AST 10 - 35 U/L 17 14  ALT 6 - 29 U/L 9 10  Total Bilirubin 0.2 - 1.2 mg/dL 0.6 0.4    Lab Results  Component Value Date/Time   TSH 3.37 10/16/2020 08:58 AM   TSH 4.01 10/06/2019 10:02 AM    Clinical ASCVD: No  The 10-year ASCVD risk score Mikey Bussing DC Jr., et al., 2013) is: 8.9%   Values used to calculate the score:     Age: 89 years     Sex: Female     Is  Non-Hispanic African American: No     Diabetic: No     Tobacco smoker: Yes     Systolic Blood Pressure: [id number] mmHg     Is BP treated: No     HDL Cholesterol: 47 mg/dL     Total Cholesterol: 201 mg/dL     Social History   Tobacco Use  Smoking Status Every Day   Packs/day: 0.50   Types: Cigarettes  Smokeless Tobacco Never   BP Readings from Last 3 Encounters:  11/21/20 115/72  10/23/20 135/76  05/22/20 119/71   Pulse Readings from Last 3 Encounters:  11/21/20 81  10/23/20 85  05/22/20 82   Wt Readings from Last 3 Encounters:  11/21/20 166 lb 6.4 oz (75.5 kg)  10/23/20 166 lb 6.4 oz (75.5 kg)  09/15/20 167 lb (75.8 kg)    Assessment: Review of patient past medical history, allergies, medications, health status, including review of consultants reports, laboratory and other test data, was performed as part of comprehensive evaluation and provision of chronic care management services.   SDOH:  (Social Determinants of Health) assessments and interventions performed: none   CCM Care Plan  Allergies  Allergen Reactions   Sulfa Antibiotics     Reports had reaction ~20 years ago   Penicillins Rash and Other (See Comments)    Has patient had  a PCN reaction causing immediate rash, facial/tongue/throat swelling, SOB or lightheadedness with hypotension: No Has patient had a PCN reaction causing severe rash involving mucus membranes or skin necrosis: No Has patient had a PCN reaction that required hospitalization: No Has patient had a PCN reaction occurring within the last 10 years: Yes If all of the above answers are "NO", then may proceed with Cephalosporin use.     Medications Reviewed Today     Reviewed by Vella Raring, RPH-CPP (Pharmacist) on 11/22/20 at 1140  Med List Status: <None>   Medication Order Taking? Sig Documenting Provider Last Dose Status Informant  albuterol (ACCUNEB) 0.63 MG/3ML nebulizer solution [id number] Yes Take 1 ampule by nebulization every 6  (six) hours as needed for wheezing. [provider] Taking Active   atorvastatin (LIPITOR) 40 MG tablet [id number] Yes Take 1 tablet (40 mg total) by mouth daily. [name], DO Taking Active   BREZTRI AEROSPHERE 160-9-4.8 MCG/ACT AERO [id number] Yes INHALE 2 PUFFS BY MOUTH INTO THE LUNGS IN THE MORNING AND AT BEDTIME Kathrine Haddock, NP Taking Active   butalbital-acetaminophen-caffeine (FIORICET, ESGIC) 50-325-40 MG tablet [id number] Yes Take 1 tablet by mouth every 6 (six) hours as needed for headache.  [provider] Taking Active Self  diclofenac Sodium (VOLTAREN) 1 % GEL [id number] Yes Apply topically daily as needed. [provider] Taking Active   fluticasone (FLONASE) 50 MCG/ACT nasal spray [id number] Yes Place 2 sprays into both nostrils daily. [name], DO Taking Active   gabapentin (NEURONTIN) 400 MG capsule [id number] Yes TAKE 1 CAPSULE BY MOUTH 4 TIMES DAILY Karamalegos, Devonne Doughty, DO Taking Active   hydrOXYzine (VISTARIL) 25 MG capsule [id number] Yes Take 1 capsule by mouth daily as needed. [provider] Taking Active   levothyroxine (SYNTHROID) 200 MCG tablet [id number] Yes Take 1 tablet (200 mcg total) by mouth daily before breakfast. [name], DO Taking Active   meloxicam (MOBIC) 15 MG tablet [id number] Yes Take 1 tablet (15 mg total) by mouth daily as needed for pain. [name], DO Taking Active   montelukast (SINGULAIR) 10 MG tablet [id number] Yes Take 1 tablet (10 mg total) by mouth daily. [name], DO Taking Active   oxycodone (OXY-IR) 5 MG capsule [id number] Yes Take 5 mg by mouth every 4 (four) hours as needed. 1-2 tablets every 6 hours as needed [provider] Taking Active   polyethylene glycol (MIRALAX / GLYCOLAX) 17 g packet [id number] Yes Take 17 g by mouth daily as needed. [provider] Taking Active   PROAIR HFA 108 [phone] Base) MCG/ACT inhaler  [id number] Yes INHALE 1 TO 2 PUFFS INTO THE LUNGS EVERY6 HOURS AS NEEDED FOR WHEEZING OR SHORTNESS OF BREATH Karamalegos, Devonne Doughty, DO Taking Active   QUEtiapine (SEROQUEL) 100 MG tablet [id number] Yes TAKE 1.5 TABLETS (150 MG TOTAL) BY MOUTHAT BEDTIME [name], DO Taking Active             Patient Active Problem List   Diagnosis Date Noted   Abdominal pain, generalized    Heartburn    Positive colorectal cancer screening using Cologuard test    Old tear of meniscus of right knee 11/09/2019   Mixed hyperlipidemia 10/06/2019   Essential hypertension 10/06/2019   Osteoarthritis of spine with radiculopathy, cervical region 10/06/2019   Chronic pain syndrome 10/06/2019   Major depressive disorder, recurrent, in partial remission ([place]) 07/09/2019   Psychophysiological insomnia 07/09/2019   GAD (generalized anxiety disorder) 07/09/2019  Primary osteoarthritis involving multiple joints 07/09/2019   Chronic pain of right knee 07/09/2019   Chronic bilateral low back pain without sciatica 07/09/2019   Centrilobular emphysema ([place]) 07/09/2019   Obesity 07/16/2017   Hypothyroidism 07/16/2017   Smoker 07/16/2017   Plantar fasciitis, bilateral 07/16/2017    Immunization History  Administered Date(s) Administered   Influenza,inj,Quad PF,6+ Mos 01/04/2017, 12/31/2018   Influenza-Unspecified 02/09/2020   PFIZER(Purple Top)SARS-COV-2 Vaccination 07/15/2019, 08/11/2019, 02/21/2020   PNEUMOCOCCAL CONJUGATE-20 10/23/2020   Pneumococcal Polysaccharide-23 12/11/2017    Conditions to be addressed/monitored: HLD, HTN, COPD, hypothyroidism, tobacco use  Care Plan : PharmD - Medication Assistance  Updates made by Vella Raring, RPH-CPP since 11/22/2020 12:00 AM     Problem: Disease Progression      Long-Range Goal: Disease Progression Prevented or Minimized   Start Date: 06/23/2020  Expected End Date: 09/21/2020  This Visit's Progress: On track  Recent Progress: On  track  Priority: High  Note:   Current Barriers:  Unable to independently afford treatment regimen Reports difficulty with affording copayment for Breztri inhaler through [place] [facility] Patient APPROVED for patient assistance for Breztri through [place] through 04/28/2021  Pharmacist Clinical Goal(s):  Over the next 90 days, patient will verbalize ability to afford treatment regimen through collaboration with PharmD and provider.   Interventions: 1:1 collaboration with [name], DO regarding development and update of comprehensive plan of care as evidenced by provider attestation and co-signature Inter-disciplinary care team collaboration (see longitudinal plan of care) Perform chart review Patient seen for Office Visit with PCP on 6/27 for annual physical. Patient advised to: Try to add Ensure protein supplement shake 1-2 times a day, and inc high protein foods for few bites if you are consuming only small amount to help with weight loss Start Chantix Rx to aid with smoking cessation Office Visit with PCP on 7/26 for weight check/follow up  Provider notes patient appetite/weight loss improved Patient advised to contact [place] [facility] and let provider know when ready to make next quit attempt for another trial of Chantix Today patient reports her loss of appetite was related to stress in her life Shares that she gets down/stressed about taking care of her sister, but doing better this month as setting limits with family member Attributes improved appetite to improved mental state Patient interesting in referral to CCM Education officer, museum for [employer] Comprehensive medication review performed; medication list updated in electronic medical record  Chronic Obstructive Pulmonary Disease: States "my breathing has been good", which patient attributes to having stopped smoking for 6 days, but then started again Current treatment: Breztri inhaler - 2 puffs  into lungs twice daily Albuterol nebulizer solution/Proair inhaler as needed Confirms using maintenance, Breztri inhaler, as directed and using rescue, Proair, inhaler as needed Counsel patient on rational for/remind to rinse mouth out after use of Breztri   Tobacco Abuse: Currently smoking 5-6 cigarettes/day Motivation: "I want to breathe more than I want to smoke"; bad taste of cigarettes Recently quit for ~6 days using Chantix, but started smoking again Reports felt improvement in breathing when stopped Triggers: wanting to hold cigarette; being around others who are smoking Strategies: holding pen in hand; use of hard candy, nicotine replacement (lozenges) Encouraged patient to contact [place] [facility]uit[facility] [phone]) today Confirms will follow up with CM Pharmacist or PCP when ready if wants to try Chantix again  Hypothyroidism: Controlled; current treatment: levothyroxine 228mg once daily Reports taking levothyroxine consistently in the morning on an empty stomach,  at least 30 minutes before food   Hypertension: Current treatment: None Reports checking home BP sometimes at home, but has not kept log Recalls SBP yesterday: 128 Counsel patient to monitor home BP, keep log of results, bring record with her to medical appointments and contact office if readings consistently >140/90 or for any symptoms   Hyperlipidemia Uncontrolled; current treatment: atorvastatin 40 mg daily Counseled patient on rational for/importance of adherence to atorvastatin/LDL lowering   Medication Adherence: Encourage patient to continue to use weekly pillbox for scheduled medications as adherence aid  Patient Goals/Self-Care Activities Over the next 90 days, patient will:  - take medications as prescribed - focus on medication adherence by using weekly pillbox - collaborate with provider on medication access solutions  Follow Up Plan: Will keep telephone follow up appointment with care management team  member as previously scheduled for: 12/20/2020 at 11:15 AM      Medication Assistance: Patient APPROVED for patient assistance for Breztri through [place] through 04/28/2021  Patient's preferred pharmacy is:  [place], [place] - [address] [place] [address] Phone: [phone] Fax: [phone]  Uses pill box? Yes  Follow Up:  Patient agrees to Care Plan and Follow-up.  Wallace Cullens, PharmD, Para [place], CPP Clinical Pharmacist [facility] [phone]

## 2020-11-28 ENCOUNTER — Telehealth: Payer: Self-pay | Admitting: Licensed Clinical Social Worker

## 2020-11-28 NOTE — Telephone Encounter (Signed)
    Clinical Social Work  Chronic Care Management   Phone Outreach    11/28/2020 Name: Audrey Campbell MRN: [id number] DOB: May 21, 1955  Audrey Campbell is a 64 y.o. year old female who is a primary care patient of [name], DO .   CCM LCSW reached out to patient today by phone to introduce self, assess needs and offer Care Management services and interventions.    Telephone outreach was unsuccessful A HIPPA compliant phone message was left for the patient providing contact information and requesting a return call.   Plan:CCM LCSW will wait for return call. If no return call is received, Will route chart to Care Guide to see if patient would like to reschedule phone appointment   Review of patient status, including review of consultants reports, relevant laboratory and other test results, and collaboration with appropriate care team members and the patient's provider was performed as part of comprehensive patient evaluation and provision of care management services.     Christa See, MSW, [facility] Care Management [email or web address] Phone [phone] 3:14 PM

## 2020-12-01 ENCOUNTER — Telehealth: Payer: Self-pay

## 2020-12-01 NOTE — Chronic Care Management (AMB) (Signed)
  Care Management   Note  12/01/2020 Name: Audrey Campbell MRN: [id number] DOB: April 22, 1956  Audrey Campbell is a 65 y.o. year old female who is a primary care patient of [name], DO and is actively engaged with the care management team. I reached out to Maryellen Pile by phone today to assist with scheduling an initial visit with the Licensed Clinical Social Worker  Follow up plan: Unsuccessful telephone outreach attempt made. A HIPAA compliant phone message was left for the patient providing contact information and requesting a return call.  The care management team will reach out to the patient again over the next 7 days.  If patient returns call to provider office, please advise to call [facility]  [facility] [place], [place], [place], [place] [address] Direct Dial: [phone] [email or web address] Website: [email or web address]

## 2020-12-05 ENCOUNTER — Other Ambulatory Visit: Payer: Self-pay | Admitting: Family Medicine

## 2020-12-05 DIAGNOSIS — E039 Hypothyroidism, unspecified: Secondary | ICD-10-CM

## 2020-12-05 DIAGNOSIS — G894 Chronic pain syndrome: Secondary | ICD-10-CM

## 2020-12-05 MED ORDER — LEVOTHYROXINE SODIUM 200 MCG PO TABS: 200.0000 ug | ORAL_TABLET | Freq: Every day | ORAL | 1 refills | Status: DC

## 2020-12-05 NOTE — Telephone Encounter (Signed)
   Notes to clinic:  Requested scripts are expired  Review for continued use and refill    Requested Prescriptions  Pending Prescriptions Disp Refills   levothyroxine (SYNTHROID) 150 MCG tablet [Pharmacy Med Name: LEVOTHYROXINE SODIUM 150 MCG TAB] 30 tablet     Sig: TAKE 1 TABLET BY MOUTH DAILY      Endocrinology:  Hypothyroid Agents Failed - 12/05/2020 12:05 PM      Failed - TSH needs to be rechecked within 3 months after an abnormal result. Refill until TSH is due.      Passed - TSH in normal range and within 360 days    TSH  Date Value Ref Range Status  10/16/2020 3.37 0.40 - 4.50 mIU/L Final          Passed - Valid encounter within last 12 months    Recent Outpatient Visits           2 weeks ago Centrilobular emphysema ([place])   [place], DO   1 month ago Annual physical exam   [place], DO   2 months ago COVID-19 virus infection   [place], DO   6 months ago Centrilobular emphysema ([place])   [facility] [name], DO   8 months ago Centrilobular emphysema ([place])   [place], DO       Future Appointments             In 5 months [place], Devonne Doughty, DO [facility], [place]               BAC 50-325-40 MG tablet [Pharmacy Med Name: BAC 50-325-40 MG TAB] 60 tablet     Sig: TAKE 1 TABLET BY MOUTH TWICE A DAY AS NEEDED      Not Delegated - Analgesics:  Non-Opioid Analgesic Combinations Failed - 12/05/2020 12:05 PM      Failed - This refill cannot be delegated      Passed - Valid encounter within last 12 months    Recent Outpatient Visits           2 weeks ago Centrilobular emphysema ([place])   [facility] [place], Devonne Doughty, DO   1 month ago Annual physical exam   [place], DO   2  months ago COVID-19 virus infection   [place], DO   6 months ago Centrilobular emphysema [facility])   [facility] [name], DO   8 months ago Centrilobular emphysema [facility])   [facility] [name], DO       Future Appointments             In 5 months [place] Ranger, Devonne Doughty, DO [facility], [facility]

## 2020-12-06 ENCOUNTER — Telehealth: Payer: Self-pay

## 2020-12-06 DIAGNOSIS — G8929 Other chronic pain: Secondary | ICD-10-CM

## 2020-12-06 DIAGNOSIS — R519 Headache, unspecified: Secondary | ICD-10-CM

## 2020-12-06 MED ORDER — BUTALBITAL-APAP-CAFFEINE 50-325-40 MG PO TABS: 1.0000 | ORAL_TABLET | Freq: Four times a day (QID) | ORAL | 3 refills | Status: DC | PRN

## 2020-12-06 NOTE — Addendum Note (Signed)
Addended by: [name] on: 12/06/2020 05:13 PM   Modules accepted: Orders

## 2020-12-06 NOTE — Telephone Encounter (Signed)
Patient came by requesting a refill on [employer].

## 2020-12-12 NOTE — Telephone Encounter (Signed)
Patient declined rescheduling

## 2020-12-12 NOTE — Chronic Care Management (AMB) (Signed)
  Care Management   Note  12/12/2020 Name: Audrey Campbell MRN: [id number] DOB: 1955-09-29  [name] is a 65 y.o. year old female who is a primary care patient of [name], DO and is actively engaged with the care management team. I reached out to Maryellen Pile by phone today to assist with re-scheduling a follow up visit with the Licensed Clinical Social Worker  Follow up plan: Patient declines further follow up and engagement by the LCSW . Appropriate care team members and provider have been notified via electronic communication.   Noreene Larsson, [place], [place], [place] [address] Direct Dial: [phone] [email or web address] Website: [email or web address]

## 2020-12-17 DIAGNOSIS — J449 Chronic obstructive pulmonary disease, unspecified: Secondary | ICD-10-CM | POA: Diagnosis not present

## 2020-12-20 ENCOUNTER — Ambulatory Visit (INDEPENDENT_AMBULATORY_CARE_PROVIDER_SITE_OTHER): Payer: Medicare Other | Admitting: Pharmacist

## 2020-12-20 DIAGNOSIS — E039 Hypothyroidism, unspecified: Secondary | ICD-10-CM | POA: Diagnosis not present

## 2020-12-20 DIAGNOSIS — I10 Essential (primary) hypertension: Secondary | ICD-10-CM | POA: Diagnosis not present

## 2020-12-20 DIAGNOSIS — J432 Centrilobular emphysema: Secondary | ICD-10-CM | POA: Diagnosis not present

## 2020-12-20 DIAGNOSIS — F17200 Nicotine dependence, unspecified, uncomplicated: Secondary | ICD-10-CM

## 2020-12-20 NOTE — Chronic Care Management (AMB) (Signed)
Chronic Care Management Pharmacy Note  12/20/2020 Name:  Audrey Campbell MRN:  [id number] DOB:  [birth year]   Subjective: Audrey Campbell is an 65 y.o. year old female who is a primary patient of Audrey Hauser, DO.  The CCM team was consulted for assistance with disease management and care coordination needs.    Engaged with patient by telephone for follow up visit in response to provider referral for pharmacy case management and/or care coordination services.   Consent to Services:  The patient was given information about Chronic Care Management services, agreed to services, and gave verbal consent prior to initiation of services.  Please see initial visit note for detailed documentation.   Patient Care Team: Audrey Hauser, DO as PCP - General (Family Medicine) Audrey Junker, RN as Registered Nurse Theodore Demark, RN as Registered Nurse Curley Spice Audrey Campbell, RPH-CPP as Pharmacist  Recent office visits: None  Hospital visits: None in previous 6 months  Objective:  Lab Results  Component Value Date   CREATININE 0.93 10/16/2020   CREATININE 0.95 10/06/2019   CREATININE 0.90 12/09/2017    Lab Results  Component Value Date   HGBA1C 5.6 10/16/2020   Last diabetic Eye exam: No results found for: HMDIABEYEEXA  Last diabetic Foot exam: No results found for: HMDIABFOOTEX      Component Value Date/Time   CHOL 201 (H) 10/16/2020 0858   TRIG 130 10/16/2020 0858   HDL 47 (L) 10/16/2020 0858   CHOLHDL 4.3 10/16/2020 0858   LDLCALC 129 (H) 10/16/2020 0858    Hepatic Function Latest Ref Rng & Units 10/16/2020 10/06/2019  Total Protein 6.1 - 8.1 g/dL 7.1 7.0  AST 10 - 35 U/L 17 14  ALT 6 - 29 U/L 9 10  Total Bilirubin 0.2 - 1.2 mg/dL 0.6 0.4    Lab Results  Component Value Date/Time   TSH 3.37 10/16/2020 08:58 AM   TSH 4.01 10/06/2019 10:02 AM     Social History   Tobacco Use  Smoking Status Every Day   Packs/day: 0.50   Types:  Cigarettes  Smokeless Tobacco Never   BP Readings from Last 3 Encounters:  11/21/20 115/72  10/23/20 135/76  05/22/20 119/71   Pulse Readings from Last 3 Encounters:  11/21/20 81  10/23/20 85  05/22/20 82   Wt Readings from Last 3 Encounters:  11/21/20 166 lb 6.4 oz (75.5 kg)  10/23/20 166 lb 6.4 oz (75.5 kg)  09/15/20 167 lb (75.8 kg)    Assessment: Review of patient past medical history, allergies, medications, health status, including review of consultants reports, laboratory and other test data, was performed as part of comprehensive evaluation and provision of chronic care management services.   SDOH:  (Social Determinants of Health) assessments and interventions performed:    CCM Care Plan  Allergies  Allergen Reactions   Sulfa Antibiotics     Reports had reaction ~20 years ago   Penicillins Rash and Other (See Comments)    Has patient had a PCN reaction causing immediate rash, facial/tongue/throat swelling, SOB or lightheadedness with hypotension: No Has patient had a PCN reaction causing severe rash involving mucus membranes or skin necrosis: No Has patient had a PCN reaction that required hospitalization: No Has patient had a PCN reaction occurring within the last 10 years: Yes If all of the above answers are "NO", then may proceed with Cephalosporin use.     Medications Reviewed Today     Reviewed by Harlow Asa  A, RPH-CPP (Pharmacist) on 11/22/20 at 1140  Med List Status: <None>   Medication Order Taking? Sig Documenting Provider Last Dose Status Informant  albuterol (ACCUNEB) 0.63 MG/3ML nebulizer solution [id number] Yes Take 1 ampule by nebulization every 6 (six) hours as needed for wheezing. [provider] Taking Active   atorvastatin (LIPITOR) 40 MG tablet [id number] Yes Take 1 tablet (40 mg total) by mouth daily. Audrey Hauser, DO Taking Active   BREZTRI AEROSPHERE 160-9-4.8 MCG/ACT AERO [id number] Yes INHALE 2 PUFFS BY MOUTH INTO  THE LUNGS IN THE MORNING AND AT BEDTIME Kathrine Haddock, NP Taking Active   butalbital-acetaminophen-caffeine (FIORICET, ESGIC) 50-325-40 MG tablet [id number] Yes Take 1 tablet by mouth every 6 (six) hours as needed for headache.  [provider] Taking Active Self  diclofenac Sodium (VOLTAREN) 1 % GEL [id number] Yes Apply topically daily as needed. [provider] Taking Active   fluticasone (FLONASE) 50 MCG/ACT nasal spray [id number] Yes Place 2 sprays into both nostrils daily. Audrey Hauser, DO Taking Active   gabapentin (NEURONTIN) 400 MG capsule [id number] Yes TAKE 1 CAPSULE BY MOUTH 4 TIMES DAILY Karamalegos, Devonne Doughty, DO Taking Active   hydrOXYzine (VISTARIL) 25 MG capsule [id number] Yes Take 1 capsule by mouth daily as needed. [provider] Taking Active   levothyroxine (SYNTHROID) 200 MCG tablet [id number] Yes Take 1 tablet (200 mcg total) by mouth daily before breakfast. Audrey Hauser, DO Taking Active   meloxicam (MOBIC) 15 MG tablet [id number] Yes Take 1 tablet (15 mg total) by mouth daily as needed for pain. Audrey Hauser, DO Taking Active   montelukast (SINGULAIR) 10 MG tablet [id number] Yes Take 1 tablet (10 mg total) by mouth daily. Audrey Hauser, DO Taking Active   oxycodone (OXY-IR) 5 MG capsule [id number] Yes Take 5 mg by mouth every 4 (four) hours as needed. 1-2 tablets every 6 hours as needed [provider] Taking Active   polyethylene glycol (MIRALAX / GLYCOLAX) 17 g packet [id number] Yes Take 17 g by mouth daily as needed. [provider] Taking Active   PROAIR HFA 108 [phone] Base) MCG/ACT inhaler [id number] Yes INHALE 1 TO 2 PUFFS INTO THE LUNGS EVERY6 HOURS AS NEEDED FOR WHEEZING OR SHORTNESS OF BREATH Karamalegos, Devonne Doughty, DO Taking Active   QUEtiapine (SEROQUEL) 100 MG tablet [id number] Yes TAKE 1.5 TABLETS (150 MG TOTAL) BY MOUTHAT BEDTIME Audrey Hauser, DO Taking Active              Patient Active Problem List   Diagnosis Date Noted   Abdominal pain, generalized    Heartburn    Positive colorectal cancer screening using Cologuard test    Old tear of meniscus of right knee 11/09/2019   Mixed hyperlipidemia 10/06/2019   Essential hypertension 10/06/2019   Osteoarthritis of spine with radiculopathy, cervical region 10/06/2019   Chronic pain syndrome 10/06/2019   Major depressive disorder, recurrent, in partial remission ([place]) 07/09/2019   Psychophysiological insomnia 07/09/2019   GAD (generalized anxiety disorder) 07/09/2019   Primary osteoarthritis involving multiple joints 07/09/2019   Chronic pain of right knee 07/09/2019   Chronic bilateral low back pain without sciatica 07/09/2019   Centrilobular emphysema ([place]) 07/09/2019   Obesity 07/16/2017   Hypothyroidism 07/16/2017   Smoker 07/16/2017   Plantar fasciitis, bilateral 07/16/2017    Immunization History  Administered Date(s) Administered   Influenza,inj,Quad PF,6+ Mos 01/04/2017, 12/31/2018   Influenza-Unspecified 02/09/2020   PFIZER(Purple Top)SARS-COV-2 Vaccination 07/15/2019, 08/11/2019, 02/21/2020   PNEUMOCOCCAL CONJUGATE-20 10/23/2020  Pneumococcal Polysaccharide-23 12/11/2017    Conditions to be addressed/monitored: HLD, HTN, COPD, hypothyroidism, tobacco use  Care Plan : PharmD - Medication Assistance  Updates made by Rennis Petty, RPH-CPP since 12/20/2020 12:00 AM     Problem: Disease Progression      Long-Range Goal: Disease Progression Prevented or Minimized   Start Date: 06/23/2020  Expected End Date: 09/21/2020  This Visit's Progress: On track  Recent Progress: On track  Priority: High  Note:   Current Barriers:  Unable to independently afford treatment regimen Reports difficulty with affording copayment for Breztri inhaler through [place] [facility] Patient APPROVED for patient assistance for Breztri through [employer] through 04/28/2021  Pharmacist  Clinical Goal(s):  Over the next 90 days, patient will verbalize ability to afford treatment regimen through collaboration with PharmD and provider.   Interventions: 1:1 collaboration with Audrey Hauser, DO regarding development and update of comprehensive plan of care as evidenced by provider attestation and co-signature Inter-disciplinary care team collaboration (see longitudinal plan of care) Note patient previously interested in referral to [place] Worker for [employer] [employer], but reports declined when received call from Education officer, museum as was feeling better, but will reach out if feel like needs re-referral in future  Chronic Obstructive Pulmonary Disease: Current treatment: Breztri inhaler - 2 puffs into lungs twice daily Albuterol nebulizer solution/Proair inhaler as needed Confirms using maintenance, Breztri inhaler, as directed and using rescue, Proair, inhaler as needed Discuss rational for/remind to rinse mouth out after use of Breztri   Tobacco Abuse: Reports has set a quit date for her [birth year], [birth year] Motivation: "I want to breathe more than I want to smoke"; bad taste of cigarettes Triggers: wanting to hold cigarette; being around others who are smoking Strategies: holding pen in hand; use of hard candy, nicotine replacement (lozenges) Reports helps that her neighbor is currently working on quitting as well Again encouraged patient to contact [place] [facility]uit[facility] [phone])   Hypothyroidism: Controlled; current treatment: levothyroxine 239mg once daily Lab Results  Component Value Date   TSH 3.37 10/16/2020  Reports taking levothyroxine consistently in the morning on an empty stomach, at least 30 minutes before food Reports has been more nervous and cold chills sometimes recently and wonders if this could be related to levothyroxine dose or when she drinks coffee.  Reports that she is going to monitor. Advise patient to schedule follow up appointment with PCP if  symptoms continue/new symptoms   Hypertension: Current treatment: None Reports last checked BP: 8/5: 127/78, HR 101 8/1: 128/76, HR 80 Counsel on BP monitoring technique Counsel patient to monitor home BP, keep log of results, bring record with her to medical appointments and contact office if readings consistently >140/90 or for any symptoms   Hyperlipidemia Uncontrolled; current treatment: atorvastatin 40 mg daily Counseled patient on rational for/importance of adherence to atorvastatin/LDL lowering   Medication Adherence: Encourage patient to continue to use weekly pillbox for scheduled medications as adherence aid  Patient Goals/Self-Care Activities Over the next 90 days, patient will:  - take medications as prescribed - focus on medication adherence by using weekly pillbox - collaborate with provider on medication access solutions  Follow Up Plan: Will keep telephone follow up appointment with care management team member as previously scheduled for: 10/3 at 11:15 am     [facility] [place] [address]Phone: [phone]: [phone]  Follow Up:  Patient  agrees to Care Plan and Follow-up.  Wallace Cullens, PharmD, [place], CPP Clinical Pharmacist [facility] [phone]

## 2020-12-20 NOTE — Patient Instructions (Signed)
Visit Information  PATIENT GOALS:  Goals Addressed             This Visit's Progress    Pharmacy Goals       Please check your home blood pressure, keep a log of the results and bring this with you to your medical appointments  Please use your weekly pillbox for organizing your medications  Please consider calling the [facility] Quitline for their help with quitting smoking.  The [place] Quitline phone number is: [phone]  Feel free to call me with any questions or concerns. I look forward to our next call!   Wallace Cullens, PharmD, [place] [phone]        The patient verbalized understanding of instructions, educational materials, and care plan provided today and declined offer to receive copy of patient instructions, educational materials, and care plan.   Telephone follow up appointment with care management team member scheduled for: 10/3 at 11:15 am

## 2020-12-29 DIAGNOSIS — J449 Chronic obstructive pulmonary disease, unspecified: Secondary | ICD-10-CM | POA: Diagnosis not present

## 2021-01-08 ENCOUNTER — Other Ambulatory Visit: Payer: Self-pay | Admitting: Family Medicine

## 2021-01-08 ENCOUNTER — Telehealth: Payer: Self-pay | Admitting: Gastroenterology

## 2021-01-08 DIAGNOSIS — J432 Centrilobular emphysema: Secondary | ICD-10-CM

## 2021-01-08 NOTE — Telephone Encounter (Signed)
Patient would like to get Endoscopy scheduled..(Patient had one scheduled on 05/18/20 but cancelled)..Clinical staff will follow up with patient.

## 2021-01-08 NOTE — Telephone Encounter (Signed)
Copied from [place] [phone]. Topic: Quick Communication - Rx Refill/Question >> Jan 08, 2021  1:32 PM Tessa Lerner A wrote: Medication: PROAIR HFA 108 (90 Base) MCG/ACT inhaler [id number]   Has the patient contacted their pharmacy? Yes.   (Agent: If no, request that the patient contact the pharmacy for the refill.) (Agent: If yes, when and what did the pharmacy advise?)  Preferred Pharmacy (with phone number or street name): [facility], [place]  Phone:  [phone] Fax:  [phone]   Agent: Please be advised that RX refills may take up to 3 business days. We ask that you follow-up with your pharmacy.

## 2021-01-09 ENCOUNTER — Other Ambulatory Visit: Payer: Self-pay | Admitting: Gastroenterology

## 2021-01-09 DIAGNOSIS — R195 Other fecal abnormalities: Secondary | ICD-10-CM

## 2021-01-09 MED ORDER — NA SULFATE-K SULFATE-MG SULF 17.5-3.13-1.6 GM/177ML PO SOLN: 1.0000 | Freq: Once | ORAL | 0 refills | Status: AC

## 2021-01-09 NOTE — Telephone Encounter (Signed)
I called pt and scheduled colonoscopy for Sept 28... I went over the 2 day prep with pt in depth and she did state she wrote the instructions down.... Pt expressed understanding and advised to call back if she has any further questions... Also confirmed pt is not currently taking a blood thinner

## 2021-01-17 DIAGNOSIS — J449 Chronic obstructive pulmonary disease, unspecified: Secondary | ICD-10-CM | POA: Diagnosis not present

## 2021-01-18 ENCOUNTER — Other Ambulatory Visit: Payer: Self-pay | Admitting: Family Medicine

## 2021-01-18 DIAGNOSIS — M159 Polyosteoarthritis, unspecified: Secondary | ICD-10-CM

## 2021-01-18 DIAGNOSIS — M8949 Other hypertrophic osteoarthropathy, multiple sites: Secondary | ICD-10-CM

## 2021-01-18 DIAGNOSIS — M4722 Other spondylosis with radiculopathy, cervical region: Secondary | ICD-10-CM

## 2021-01-18 DIAGNOSIS — G894 Chronic pain syndrome: Secondary | ICD-10-CM

## 2021-01-23 ENCOUNTER — Encounter: Payer: Self-pay | Admitting: Gastroenterology

## 2021-01-24 ENCOUNTER — Ambulatory Visit: Payer: Medicare Other | Admitting: Certified Registered Nurse Anesthetist

## 2021-01-24 ENCOUNTER — Encounter: Payer: Self-pay | Admitting: Gastroenterology

## 2021-01-24 ENCOUNTER — Encounter
Admission: RE | Disposition: A | Discharge status: Home / Self Care | Payer: Self-pay | Source: Home / Self Care | Attending: Gastroenterology

## 2021-01-24 ENCOUNTER — Ambulatory Visit
Admission: RE | Admit: 2021-01-24 | Discharge: 2021-01-24 | Disposition: A | Discharge status: Home / Self Care | Payer: Medicare Other | Attending: Gastroenterology | Admitting: Gastroenterology

## 2021-01-24 DIAGNOSIS — K5730 Diverticulosis of large intestine without perforation or abscess without bleeding: Secondary | ICD-10-CM | POA: Diagnosis not present

## 2021-01-24 DIAGNOSIS — M1990 Unspecified osteoarthritis, unspecified site: Secondary | ICD-10-CM | POA: Diagnosis not present

## 2021-01-24 DIAGNOSIS — I10 Essential (primary) hypertension: Secondary | ICD-10-CM | POA: Insufficient documentation

## 2021-01-24 DIAGNOSIS — D175 Benign lipomatous neoplasm of intra-abdominal organs: Secondary | ICD-10-CM | POA: Insufficient documentation

## 2021-01-24 DIAGNOSIS — E785 Hyperlipidemia, unspecified: Secondary | ICD-10-CM | POA: Insufficient documentation

## 2021-01-24 DIAGNOSIS — Z79899 Other long term (current) drug therapy: Secondary | ICD-10-CM | POA: Insufficient documentation

## 2021-01-24 DIAGNOSIS — F17210 Nicotine dependence, cigarettes, uncomplicated: Secondary | ICD-10-CM | POA: Insufficient documentation

## 2021-01-24 DIAGNOSIS — D179 Benign lipomatous neoplasm, unspecified: Secondary | ICD-10-CM | POA: Diagnosis not present

## 2021-01-24 DIAGNOSIS — J449 Chronic obstructive pulmonary disease, unspecified: Secondary | ICD-10-CM | POA: Insufficient documentation

## 2021-01-24 DIAGNOSIS — Z79890 Hormone replacement therapy (postmenopausal): Secondary | ICD-10-CM | POA: Diagnosis not present

## 2021-01-24 DIAGNOSIS — Z9981 Dependence on supplemental oxygen: Secondary | ICD-10-CM | POA: Insufficient documentation

## 2021-01-24 DIAGNOSIS — Z1211 Encounter for screening for malignant neoplasm of colon: Secondary | ICD-10-CM | POA: Diagnosis not present

## 2021-01-24 DIAGNOSIS — E039 Hypothyroidism, unspecified: Secondary | ICD-10-CM | POA: Diagnosis not present

## 2021-01-24 DIAGNOSIS — R195 Other fecal abnormalities: Secondary | ICD-10-CM | POA: Insufficient documentation

## 2021-01-24 DIAGNOSIS — D1779 Benign lipomatous neoplasm of other sites: Secondary | ICD-10-CM | POA: Diagnosis not present

## 2021-01-24 DIAGNOSIS — Z791 Long term (current) use of non-steroidal anti-inflammatories (NSAID): Secondary | ICD-10-CM | POA: Diagnosis not present

## 2021-01-24 DIAGNOSIS — K5790 Diverticulosis of intestine, part unspecified, without perforation or abscess without bleeding: Secondary | ICD-10-CM | POA: Diagnosis not present

## 2021-01-24 HISTORY — PX: COLONOSCOPY WITH PROPOFOL: SHX5780

## 2021-01-24 SURGERY — COLONOSCOPY WITH PROPOFOL
Anesthesia: General

## 2021-01-24 MED ORDER — LIDOCAINE HCL (CARDIAC) PF 100 MG/5ML IV SOSY
PREFILLED_SYRINGE | INTRAVENOUS | Status: DC | PRN
Start: 2021-01-24 — End: 2021-01-24
  Administered 2021-01-24: 50 mg via INTRAVENOUS

## 2021-01-24 MED ORDER — PROPOFOL 10 MG/ML IV BOLUS
INTRAVENOUS | Status: AC
  Filled 2021-01-24: qty 20

## 2021-01-24 MED ORDER — PROPOFOL 500 MG/50ML IV EMUL
INTRAVENOUS | Status: DC | PRN
  Administered 2021-01-24: 150 ug/kg/min via INTRAVENOUS

## 2021-01-24 MED ORDER — PROPOFOL 500 MG/50ML IV EMUL
INTRAVENOUS | Status: AC
  Filled 2021-01-24: qty 50

## 2021-01-24 MED ORDER — HYDRALAZINE HCL 20 MG/ML IJ SOLN
INTRAMUSCULAR | Status: DC | PRN
  Administered 2021-01-24: 2.5 mg via INTRAVENOUS

## 2021-01-24 MED ORDER — SODIUM CHLORIDE 0.9 % IV SOLN: INTRAVENOUS | Status: DC

## 2021-01-24 MED ORDER — PROPOFOL 10 MG/ML IV BOLUS
INTRAVENOUS | Status: DC | PRN
  Administered 2021-01-24: 50 mg via INTRAVENOUS

## 2021-01-24 NOTE — Transfer of Care (Signed)
  Immediate Anesthesia Transfer of Care Note  Patient: Audrey Campbell  Procedure(s) Performed: COLONOSCOPY WITH PROPOFOL  Patient Location: PACU  Anesthesia Type:General  Level of Consciousness: awake, alert  and oriented  Airway & Oxygen Therapy: Patient Spontanous Breathing and Patient connected to nasal cannula oxygen  Post-op Assessment: Report given to RN and Post -op Vital signs reviewed and stable  Post vital signs: Reviewed and stable  Last Vitals:  Vitals Value Taken Time  BP 157/83 01/24/21 0902  Temp 36.2 C 01/24/21 0901  Pulse 90 01/24/21 0902  Resp    SpO2 100 % 01/24/21 0902  Vitals shown include unvalidated device data.  Last Pain:  Vitals:   01/24/21 0901  TempSrc: Temporal  PainSc:          Complications: No notable events documented.

## 2021-01-24 NOTE — Anesthesia Postprocedure Evaluation (Signed)
Anesthesia Post Note  Patient: Audrey Campbell  Procedure(s) Performed: COLONOSCOPY WITH PROPOFOL  Patient location during evaluation: Endoscopy Anesthesia Type: General Level of consciousness: awake and alert and oriented Pain management: pain level controlled Vital Signs Assessment: post-procedure vital signs reviewed and stable Respiratory status: spontaneous breathing, nonlabored ventilation and respiratory function stable Cardiovascular status: blood pressure returned to baseline and stable Postop Assessment: no signs of nausea or vomiting Anesthetic complications: no   No notable events documented.   Last Vitals:  Vitals:   01/24/21 0901 01/24/21 0921  BP:  (!) 159/79  Pulse:    Resp: 16   Temp: (!) 36.2 C   SpO2:      Last Pain:  Vitals:   01/24/21 0921  TempSrc:   PainSc: 0-No pain                 Audrey Campbell

## 2021-01-24 NOTE — Anesthesia Preprocedure Evaluation (Signed)
Anesthesia Evaluation  Patient identified by MRN, date of birth, ID band Patient awake    Reviewed: Allergy & Precautions, NPO status , Patient's Chart, lab work & pertinent test results  History of Anesthesia Complications Negative for: history of anesthetic complications  Airway Mallampati: II  TM Distance: >3 FB Neck ROM: Full    Dental  (+) Poor Dentition   Pulmonary neg sleep apnea, COPD (intermittent O2 use),  oxygen dependent, Current Smoker and Patient abstained from smoking.,    breath sounds clear to auscultation- rhonchi (-) wheezing      Cardiovascular Exercise Tolerance: Good hypertension, (-) CAD, (-) Past MI, (-) Cardiac Stents and (-) CABG  Rhythm:Regular Rate:Normal - Systolic murmurs and - Diastolic murmurs    Neuro/Psych neg Seizures PSYCHIATRIC DISORDERS Anxiety Depression negative neurological ROS     GI/Hepatic negative GI ROS, Neg liver ROS,   Endo/Other  neg diabetesHypothyroidism   Renal/GU negative Renal ROS     Musculoskeletal  (+) Arthritis ,   Abdominal (+) - obese,   Peds  Hematology negative hematology ROS (+)   Anesthesia Other Findings Past Medical History: No date: Anxiety No date: Arthritis No date: COPD (chronic obstructive pulmonary disease) (HCC) No date: Hyperlipidemia No date: Hypertension No date: Hypothyroidism No date: On home oxygen therapy No date: Plantar fasciitis   Reproductive/Obstetrics                             Anesthesia Physical Anesthesia Plan  ASA: 3  Anesthesia Plan: General   Post-op Pain Management:    Induction: Intravenous  PONV Risk Score and Plan: 1 and Propofol infusion  Airway Management Planned: Natural Airway  Additional Equipment:   Intra-op Plan:   Post-operative Plan:   Informed Consent: I have reviewed the patients History and Physical, chart, labs and discussed the procedure including the risks,  benefits and alternatives for the proposed anesthesia with the patient or authorized representative who has indicated his/her understanding and acceptance.     Dental advisory given  Plan Discussed with: CRNA and Anesthesiologist  Anesthesia Plan Comments:         Anesthesia Quick Evaluation

## 2021-01-24 NOTE — H&P (Signed)
Vonda Antigua, MD [address] [place], [place], [place], [address] [address], [place], [place], [place], [address] Phone: [phone]  Fax: [phone]  Primary Care Physician:  [name], DO   Pre-Procedure History & Physical: HPI:  Audrey Campbell is a 65 y.o. female is here for a colonoscopy.   Past Medical History:  Diagnosis Date   Anxiety    Arthritis    COPD (chronic obstructive pulmonary disease) ([place])    Hyperlipidemia    Hypertension    Hypothyroidism    On home oxygen therapy    Plantar fasciitis     Past Surgical History:  Procedure Laterality Date   bulging disc in the neck     COLONOSCOPY WITH PROPOFOL N/A 12/29/2019   Procedure: COLONOSCOPY WITH PROPOFOL;  Surgeon: Virgel Manifold, MD;  Location: [facility] ENDOSCOPY;  Service: Endoscopy;  Laterality: N/A;   DILATION AND CURETTAGE OF UTERUS     ESOPHAGOGASTRODUODENOSCOPY (EGD) WITH PROPOFOL N/A 12/29/2019   Procedure: ESOPHAGOGASTRODUODENOSCOPY (EGD) WITH PROPOFOL;  Surgeon: Virgel Manifold, MD;  Location: [facility] ENDOSCOPY;  Service: Endoscopy;  Laterality: N/A;   FOOT SURGERY Bilateral    heel  fasciitis     RHINOPLASTY     SHOULDER SURGERY     TUBAL LIGATION      Prior to Admission medications   Medication Sig Start Date End Date Taking? Authorizing Provider  atorvastatin (LIPITOR) 40 MG tablet Take 1 tablet (40 mg total) by mouth daily. 10/06/19  Yes Karamalegos, Devonne Doughty, DO  gabapentin (NEURONTIN) 400 MG capsule TAKE 1 CAPSULE BY MOUTH 4 TIMES DAILY 01/18/21  Yes Parks Ranger, Devonne Doughty, DO  levothyroxine (SYNTHROID) 200 MCG tablet Take 1 tablet (200 mcg total) by mouth daily before breakfast. 12/05/20  Yes Karamalegos, Devonne Doughty, DO  meloxicam (MOBIC) 15 MG tablet Take 1 tablet (15 mg total) by mouth daily as needed for pain. 05/03/20  Yes Karamalegos, Alexander J, DO  montelukast (SINGULAIR) 10 MG tablet Take 1 tablet (10 mg total) by mouth daily. 05/22/20  Yes  Karamalegos, Devonne Doughty, DO  oxycodone (OXY-IR) 5 MG capsule Take 5 mg by mouth every 4 (four) hours as needed. 1-2 tablets every 6 hours as needed   Yes [provider]  PROAIR HFA 108 (90 Base) MCG/ACT inhaler INHALE 1 TO 2 PUFFS INTO THE LUNGS EVERY6 HOURS AS NEEDED FOR WHEEZING OR SHORTNESS OF BREATH 01/08/21  Yes Karamalegos, Devonne Doughty, DO  QUEtiapine (SEROQUEL) 100 MG tablet TAKE 1.5 TABLETS (150 MG TOTAL) BY MOUTHAT BEDTIME 08/14/20  Yes Karamalegos, Alexander J, DO  albuterol (ACCUNEB) 0.63 MG/3ML nebulizer solution Take 1 ampule by nebulization every 6 (six) hours as needed for wheezing.    [provider]  BREZTRI AEROSPHERE 160-9-4.8 MCG/ACT AERO INHALE 2 PUFFS BY MOUTH INTO THE LUNGS IN THE MORNING AND AT BEDTIME 06/06/20   Kathrine Haddock, NP  butalbital-acetaminophen-caffeine (FIORICET) [phone] MG tablet Take 1 tablet by mouth every 6 (six) hours as needed for headache. 12/06/20   Parks Ranger, Devonne Doughty, DO  diclofenac Sodium (VOLTAREN) 1 % GEL Apply topically daily as needed.    [provider]  fluticasone (FLONASE) 50 MCG/ACT nasal spray Place 2 sprays into both nostrils daily. Patient not taking: Reported on 01/24/2021 11/21/20   [name], DO  hydrOXYzine (VISTARIL) 25 MG capsule Take 1 capsule by mouth daily as needed. 11/24/19   [provider]  polyethylene glycol (MIRALAX / GLYCOLAX) 17 g packet Take 17 g by mouth daily as  needed.    [provider]    Allergies as of 01/09/2021 - Review Complete 11/21/2020  Allergen Reaction Noted   Sulfa antibiotics  06/23/2020   Penicillins Rash and Other (See Comments) 02/23/2013    Family History  Problem Relation Age of Onset   Diabetes Father    Stroke Father 43   Thyroid disease Father    Seizures Sister    Breast cancer Neg Hx     Social History   Socioeconomic History   Marital status: Widowed    Spouse name: Not on file   Number of children: Not on file    Years of education: high school   Highest education level: High school graduate  Occupational History   Not on file  Tobacco Use   Smoking status: Every Day    Packs/day: 0.50    Types: Cigarettes   Smokeless tobacco: Never  Vaping Use   Vaping Use: Never used  Substance and Sexual Activity   Alcohol use: No   Drug use: No   Sexual activity: Yes    Birth control/protection: None  Other Topics Concern   Not on file  Social History Narrative   Not on file   Social Determinants of Health   Financial Resource Strain: Not on file  Food Insecurity: Not on file  Transportation Needs: Not on file  Physical Activity: Not on file  Stress: Not on file  Social Connections: Not on file  Intimate Partner Violence: Not on file    Review of Systems: See HPI, otherwise negative ROS  Physical Exam: Constitutional: General:   Alert,  Well-developed, well-nourished, pleasant and cooperative in NAD BP (!) 162/97   Pulse 68   Temp (!) 96.9 F (36.1 C) (Temporal)   Resp 16   Ht 5' 4.5" (1.638 m)   Wt 73.5 kg   SpO2 96%   BMI 27.38 kg/m   Head: Normocephalic, atraumatic.   Eyes:  Sclera clear, no icterus.   Conjunctiva pink.   Mouth:  No deformity or lesions, oropharynx pink & moist.  Neck:  Supple, trachea midline  Respiratory: Normal respiratory effort  Gastrointestinal:  Soft, non-tender and non-distended without masses, hepatosplenomegaly or hernias noted.  No guarding or rebound tenderness.     Cardiac: No clubbing or edema.  No cyanosis. Normal posterior tibial pedal pulses noted.  Lymphatic:  No significant cervical adenopathy.  Psych:  Alert and cooperative. Normal mood and affect.  Musculoskeletal:   Symmetrical without gross deformities. 5/5 Lower extremity strength bilaterally.  Skin: Warm. Intact without significant lesions or rashes. No jaundice.  Neurologic:  Face symmetrical, tongue midline, Normal sensation to touch;  grossly normal  neurologically.  Psych:  Alert and oriented x3, Alert and cooperative. Normal mood and affect.  Impression/Plan: Audrey Campbell is here for a colonoscopy to be performed for positive cologuard test last year and poor prep on last exam.  Risks, benefits, limitations, and alternatives regarding  colonoscopy have been reviewed with the patient.  Questions have been answered.  All parties agreeable.   Virgel Manifold, MD  01/24/2021, 7:30 AM

## 2021-01-24 NOTE — Op Note (Signed)
[facility] Gastroenterology Patient Name: Audrey Campbell Procedure Date: 01/24/2021 8:21 AM MRN: [id number] Account #: [id number] Date of Birth: Jul 25, 1955 Admit Type: Outpatient Age: 65 Room: [facility] ENDO ROOM 3 Gender: Female Note Status: Finalized Instrument Name: Jasper Riling [id number] Procedure:             Colonoscopy Indications:           Positive Cologuard test, Poor prep on previous exam. Providers:             Lynette Noah B. Bonna Gains MD, MD Medicines:             Monitored Anesthesia Care Complications:         No immediate complications. Procedure:             Pre-Anesthesia Assessment:                        - Prior to the procedure, a History and Physical was                         performed, and patient medications, allergies and                         sensitivities were reviewed. The patient's tolerance                         of previous anesthesia was reviewed.                        - The risks and benefits of the procedure and the                         sedation options and risks were discussed with the                         patient. All questions were answered and informed                         consent was obtained.                        - Patient identification and proposed procedure were                         verified prior to the procedure by the physician, the                         nurse, the anesthetist and the technician. The                         procedure was verified in the pre-procedure area in                         the procedure room in the endoscopy suite.                        - ASA Grade Assessment: II - A patient with mild                         systemic disease.                        -  After reviewing the risks and benefits, the patient                         was deemed in satisfactory condition to undergo the                         procedure.                        After obtaining informed consent, the  colonoscope was                         passed under direct vision. Throughout the procedure,                         the patient's blood pressure, pulse, and oxygen                         saturations were monitored continuously. The                         Colonoscope was introduced through the anus and                         advanced to the the terminal ileum. The colonoscopy                         was performed with ease. The patient tolerated the                         procedure well. The quality of the bowel preparation                         was good. Findings:      The perianal and digital rectal examinations were normal.      There was a lipoma, in the ascending colon and in the cecum. Biopsies       were taken with a cold forceps for histology. Pillow sign was positive       and bright yellow material seen during tunnel biopsies were suggestive       of lipoma.      A few diverticula were found in the entire colon.      Multiple diverticula were found in the sigmoid colon.      The exam was otherwise without abnormality.      The rectum, sigmoid colon, descending colon, transverse colon, ascending       colon, cecum and ileum appeared normal.      The retroflexed view of the distal rectum and anal verge was normal and       showed no anal or rectal abnormalities. Impression:            - Lipoma in the ascending colon and in the cecum.                         Biopsied.                        - Diverticulosis in the entire examined colon.                        -  Diverticulosis in the sigmoid colon.                        - The examination was otherwise normal.                        - The rectum, sigmoid colon, descending colon,                         transverse colon, ascending colon, cecum and terminal                         ileum are normal.                        - The distal rectum and anal verge are normal on                         retroflexion  view. Recommendation:        - Discharge patient to home.                        - Resume previous diet.                        - Continue present medications.                        - Repeat colonoscopy date to be determined after                         pending pathology results are reviewed.                        - Return to primary care physician as previously                         scheduled.                        - The findings and recommendations were discussed with                         the patient.                        - The findings and recommendations were discussed with                         the patient's family.                        - High fiber diet. Procedure Code(s):     --- Professional ---                        [phone], Colonoscopy, flexible; with biopsy, single or                         multiple Diagnosis Code(s):     --- Professional ---  D17.5, Benign lipomatous neoplasm of intra-abdominal                         organs                        R19.5, Other fecal abnormalities CPT copyright 2019 American Medical Association. All rights reserved. The codes documented in this report are preliminary and upon coder review may  be revised to meet current compliance requirements.  Vonda Antigua, MD Margretta Sidle B. Bonna Gains MD, MD 01/24/2021 9:03:02 AM This report has been signed electronically. Number of Addenda: 0 Note Initiated On: 01/24/2021 8:21 AM Scope Withdrawal Time: 0 hours 18 minutes 8 seconds  Total Procedure Duration: 0 hours 21 minutes 36 seconds  Estimated Blood Loss:  Estimated blood loss: none.      [facility]

## 2021-01-25 ENCOUNTER — Encounter: Payer: Self-pay | Admitting: Gastroenterology

## 2021-01-25 LAB — SURGICAL PATHOLOGY

## 2021-01-29 ENCOUNTER — Telehealth: Payer: Self-pay | Admitting: Pharmacist

## 2021-01-29 ENCOUNTER — Telehealth: Payer: Medicare Other

## 2021-01-29 NOTE — Telephone Encounter (Signed)
  Chronic Care Management   Outreach Note  01/29/2021 Name: SHAKORA NORDQUIST MRN: [id number] DOB: 1956-03-08  Referred by: [name], DO Reason for referral : No chief complaint on file.   Was unable to reach patient via telephone today and have left HIPAA compliant voicemail asking patient to return my call.    Follow Up Plan: Will collaborate with Care Guide to outreach to schedule follow up with me  Harlow Asa, PharmD, [place] [facility] [phone]

## 2021-01-31 NOTE — Telephone Encounter (Signed)
Pt declined rescheduling states that she is doing well and doesn't feel she needs anything at this time

## 2021-02-08 DIAGNOSIS — M722 Plantar fascial fibromatosis: Secondary | ICD-10-CM | POA: Diagnosis not present

## 2021-02-08 DIAGNOSIS — M25569 Pain in unspecified knee: Secondary | ICD-10-CM | POA: Diagnosis not present

## 2021-02-08 DIAGNOSIS — Z79899 Other long term (current) drug therapy: Secondary | ICD-10-CM | POA: Diagnosis not present

## 2021-02-08 DIAGNOSIS — Z79891 Long term (current) use of opiate analgesic: Secondary | ICD-10-CM | POA: Diagnosis not present

## 2021-02-08 DIAGNOSIS — M542 Cervicalgia: Secondary | ICD-10-CM | POA: Diagnosis not present

## 2021-02-08 DIAGNOSIS — G894 Chronic pain syndrome: Secondary | ICD-10-CM | POA: Diagnosis not present

## 2021-02-08 DIAGNOSIS — M25519 Pain in unspecified shoulder: Secondary | ICD-10-CM | POA: Diagnosis not present

## 2021-02-08 DIAGNOSIS — M545 Low back pain, unspecified: Secondary | ICD-10-CM | POA: Diagnosis not present

## 2021-02-12 ENCOUNTER — Ambulatory Visit (INDEPENDENT_AMBULATORY_CARE_PROVIDER_SITE_OTHER): Payer: Medicare Other | Admitting: Pharmacist

## 2021-02-12 ENCOUNTER — Other Ambulatory Visit: Payer: Self-pay | Admitting: Family Medicine

## 2021-02-12 DIAGNOSIS — J432 Centrilobular emphysema: Secondary | ICD-10-CM

## 2021-02-12 DIAGNOSIS — F17200 Nicotine dependence, unspecified, uncomplicated: Secondary | ICD-10-CM

## 2021-02-12 MED ORDER — VENTOLIN HFA 108 (90 BASE) MCG/ACT IN AERS: 1.0000 | INHALATION_SPRAY | Freq: Four times a day (QID) | RESPIRATORY_TRACT | 5 refills | Status: DC | PRN

## 2021-02-12 NOTE — Chronic Care Management (AMB) (Signed)
Chronic Care Management Pharmacy Note  02/12/2021 Name:  Audrey Campbell MRN:  [id number] DOB:  Nov 14, 1955   Subjective: Audrey Campbell is an 65 y.o. year old female who is a primary patient of [name], DO.  The CCM team was consulted for assistance with disease management and care coordination needs.    Engaged with patient by telephone for follow up visit in response to provider referral for pharmacy case management and/or care coordination services.   Consent to Services:  The patient was given information about Chronic Care Management services, agreed to services, and gave verbal consent prior to initiation of services.  Please see initial visit note for detailed documentation.   Patient Care Team: [name], DO as PCP - General (Family Medicine) Rico Junker, RN as Registered Nurse Theodore Demark, RN as Registered Nurse Curley Spice Virl Diamond, RPH-CPP as Pharmacist   Hospital visits: Patient admitted to [facility] on 9/28 as scheduled for colonoscopy  Objective:  Lab Results  Component Value Date   CREATININE 0.93 10/16/2020   CREATININE 0.95 10/06/2019   CREATININE 0.90 12/09/2017       Component Value Date/Time   CHOL 201 (H) 10/16/2020 0858   TRIG 130 10/16/2020 0858   HDL 47 (L) 10/16/2020 0858   CHOLHDL 4.3 10/16/2020 0858   LDLCALC 129 (H) 10/16/2020 0858    Hepatic Function Latest Ref Rng & Units 10/16/2020 10/06/2019  Total Protein 6.1 - 8.1 g/dL 7.1 7.0  AST 10 - 35 U/L 17 14  ALT 6 - 29 U/L 9 10  Total Bilirubin 0.2 - 1.2 mg/dL 0.6 0.4    Lab Results  Component Value Date/Time   TSH 3.37 10/16/2020 08:58 AM   TSH 4.01 10/06/2019 10:02 AM   Social History   Tobacco Use  Smoking Status Every Day   Packs/day: 0.50   Types: Cigarettes  Smokeless Tobacco Never   BP Readings from Last 3 Encounters:  01/24/21 (!) 159/79  11/21/20 115/72  10/23/20 135/76   Pulse Readings from Last  3 Encounters:  01/24/21 68  11/21/20 81  10/23/20 85   Wt Readings from Last 3 Encounters:  01/24/21 162 lb (73.5 kg)  11/21/20 166 lb 6.4 oz (75.5 kg)  10/23/20 166 lb 6.4 oz (75.5 kg)    Assessment: Review of patient past medical history, allergies, medications, health status, including review of consultants reports, laboratory and other test data, was performed as part of comprehensive evaluation and provision of chronic care management services.   SDOH:  (Social Determinants of Health) assessments and interventions performed:    CCM Care Plan  Allergies  Allergen Reactions   Sulfa Antibiotics     Reports had reaction ~20 years ago   Penicillins Rash and Other (See Comments)    Has patient had a PCN reaction causing immediate rash, facial/tongue/throat swelling, SOB or lightheadedness with hypotension: No Has patient had a PCN reaction causing severe rash involving mucus membranes or skin necrosis: No Has patient had a PCN reaction that required hospitalization: No Has patient had a PCN reaction occurring within the last 10 years: Yes If all of the above answers are "NO", then may proceed with Cephalosporin use.     Medications Reviewed Today     Reviewed by Frederich Balding, RN (Registered Nurse) on 01/24/21 at [phone]  Med List Status: <None>   Medication Order Taking? Sig Documenting Provider Last Dose Status Informant  albuterol (ACCUNEB) 0.63 MG/3ML nebulizer  solution [id number]  Take 1 ampule by nebulization every 6 (six) hours as needed for wheezing. [provider]  Active   atorvastatin (LIPITOR) 40 MG tablet [id number] Yes Take 1 tablet (40 mg total) by mouth daily. [name], DO Past Week Active   BREZTRI AEROSPHERE 160-9-4.8 MCG/ACT AERO [id number]  INHALE 2 PUFFS BY MOUTH INTO THE LUNGS IN THE MORNING AND AT BEDTIME Kathrine Haddock, NP  Active   butalbital-acetaminophen-caffeine (FIORICET) 50-325-40 MG tablet [id number]  Take 1 tablet by mouth  every 6 (six) hours as needed for headache. [name], DO  Active   diclofenac Sodium (VOLTAREN) 1 % GEL [id number]  Apply topically daily as needed. [provider]  Active   fluticasone (FLONASE) 50 MCG/ACT nasal spray [id number] No Place 2 sprays into both nostrils daily.  Patient not taking: Reported on 01/24/2021   [name], DO Completed Course Active   gabapentin (NEURONTIN) 400 MG capsule [id number] Yes TAKE 1 CAPSULE BY MOUTH 4 TIMES DAILY [name], DO 01/23/2021 Active   hydrOXYzine (VISTARIL) 25 MG capsule [id number]  Take 1 capsule by mouth daily as needed. [provider]  Active   levothyroxine (SYNTHROID) 200 MCG tablet [id number] Yes Take 1 tablet (200 mcg total) by mouth daily before breakfast. [name], DO 01/23/2021 Active   meloxicam (MOBIC) 15 MG tablet [id number] Yes Take 1 tablet (15 mg total) by mouth daily as needed for pain. [name], DO Past Month Active   montelukast (SINGULAIR) 10 MG tablet [id number] Yes Take 1 tablet (10 mg total) by mouth daily. [name], DO 01/23/2021 Active   oxycodone (OXY-IR) 5 MG capsule [id number] Yes Take 5 mg by mouth every 4 (four) hours as needed. 1-2 tablets every 6 hours as needed [provider] 01/24/2021 0600 Active   polyethylene glycol (MIRALAX / GLYCOLAX) 17 g packet [id number]  Take 17 g by mouth daily as needed. [provider]  Active   PROAIR HFA [phone] Base) MCG/ACT inhaler [id number] Yes INHALE 1 TO 2 PUFFS INTO THE LUNGS EVERY6 HOURS AS NEEDED FOR WHEEZING OR SHORTNESS OF BREATH [name], DO 01/23/2021 Active   QUEtiapine (SEROQUEL) 100 MG tablet [id number] Yes TAKE 1.5 TABLETS (150 MG TOTAL) BY MOUTHAT BEDTIME [name], DO 01/23/2021 Active             Patient Active Problem List   Diagnosis Date Noted   Lipoma of colon    Abdominal pain, generalized    Heartburn    Positive  colorectal cancer screening using Cologuard test    Old tear of meniscus of right knee 11/09/2019   Mixed hyperlipidemia 10/06/2019   Essential hypertension 10/06/2019   Osteoarthritis of spine with radiculopathy, cervical region 10/06/2019   Chronic pain syndrome 10/06/2019   Major depressive disorder, recurrent, in partial remission ([place]) 07/09/2019   Psychophysiological insomnia 07/09/2019   GAD (generalized anxiety disorder) 07/09/2019   Primary osteoarthritis involving multiple joints 07/09/2019   Chronic pain of right knee 07/09/2019   Chronic bilateral low back pain without sciatica 07/09/2019   Centrilobular emphysema ([place]) 07/09/2019   Obesity 07/16/2017   Hypothyroidism 07/16/2017   Smoker 07/16/2017   Plantar fasciitis, bilateral 07/16/2017    Immunization History  Administered Date(s) Administered   Influenza,inj,Quad PF,6+ Mos 01/04/2017, 12/31/2018   Influenza-Unspecified 02/09/2020   PFIZER(Purple Top)SARS-COV-2 Vaccination 07/15/2019, 08/11/2019, 02/21/2020   PNEUMOCOCCAL CONJUGATE-20 10/23/2020   Pneumococcal Polysaccharide-23 12/11/2017    Conditions to be addressed/monitored: HLD,  HTN, COPD, hypothyroidism, tobacco use  Care Plan : PharmD - Medication Assistance  Updates made by Rennis Petty, RPH-CPP since 02/12/2021 12:00 AM     Problem: Disease Progression      Long-Range Goal: Disease Progression Prevented or Minimized   Start Date: 06/23/2020  Expected End Date: 09/21/2020  This Visit's Progress: On track  Recent Progress: On track  Priority: High  Note:   Current Barriers:  Unable to independently afford treatment regimen Reports difficulty with affording copayment for Breztri inhaler through [place] [facility] Patient APPROVED for patient assistance for Breztri through [place] through 04/28/2021  Pharmacist Clinical Goal(s):  Over the next 90 days, patient will verbalize ability to afford treatment regimen through  collaboration with PharmD and provider.   Interventions: 1:1 collaboration with [name], DO regarding development and update of comprehensive plan of care as evidenced by provider attestation and co-signature Inter-disciplinary care team collaboration (see longitudinal plan of care)  Chronic Obstructive Pulmonary Disease: Current treatment: Breztri inhaler - 2 puffs into lungs twice daily Albuterol nebulizer solution/albuterol inhaler as needed Confirms using maintenance, Breztri inhaler, as directed and using rescue, albuterol, inhaler as needed Confirms rinsing mouth out after use of Breztri  Requests assistance from [employer] Pharmacist with [employer] Rx at pharmacy. Reports per Center, Proair brand inhaler no longer available and needs permission to switch Rx from Proair to alternative albuterol (Rx sent as DAW 1) Print production planner and speak with [place]. Advise Okay to switch Proair inhaler to generic albuterol equivalent. Note generic Proair covered as tier 2 option under patient's Part D plan  Tobacco Abuse: Reports unsuccessful quit attempt in September Currently smoking: 10 cigarettes/day Current medication: planning to start nicotine patch 14 mg/day Previous therapies tried: gum, lozenge - did not like the taste Motivation: "I want to breathe more than I want to smoke"; bad taste of cigarettes Triggers: wanting to hold cigarette; being around others who are smoking Strategies: holding pen in hand; use of hard candy Have encouraged patient to contact [place] Quitline [phone])   Medication Assistance: Reports has sufficient supply of Breztri to last until end of calendar year Schedule appointment to start paperwork for 2023 patient assistance renewal application  Hyperlipidemia Uncontrolled; current treatment: atorvastatin 40 mg daily Counseled patient on rational for/importance of adherence to atorvastatin/LDL lowering    Medication Adherence: Encouraged patient to continue to use weekly pillbox for scheduled medications as adherence aid  Patient Goals/Self-Care Activities Over the next 90 days, patient will:  - take medications as prescribed - focus on medication adherence by using weekly pillbox - collaborate with provider on medication access solutions  Follow Up Plan: Will keep telephone follow up appointment with care management team member as previously scheduled for: 11/9 at 10:45 am     Patient's preferred pharmacy is:  [facility], [address] [place] [address] Phone: [phone] Fax: [phone]   Follow Up:  Patient agrees to Care Plan and Follow-up.  Wallace Cullens, PharmD, [place], CPP Clinical Pharmacist [facility] [phone]

## 2021-02-12 NOTE — Patient Instructions (Signed)
Visit Information  PATIENT GOALS:  Goals Addressed             This Visit's Progress    Pharmacy Goals       Please check your home blood pressure, keep a log of the results and bring this with you to your medical appointments  Please use your weekly pillbox for organizing your medications  Please consider calling the [place] Quitline for their help with quitting smoking.  The [place] Quitline phone number is: [phone]  Feel free to call me with any questions or concerns. I look forward to our next call!  Wallace Cullens, PharmD, [place] [phone]        The patient verbalized understanding of instructions, educational materials, and care plan provided today and declined offer to receive copy of patient instructions, educational materials, and care plan.   Telephone follow up appointment with care management team member scheduled for: 11/9 at 10:45 am

## 2021-02-16 ENCOUNTER — Other Ambulatory Visit: Payer: Self-pay | Admitting: Family Medicine

## 2021-02-16 DIAGNOSIS — J302 Other seasonal allergic rhinitis: Secondary | ICD-10-CM

## 2021-02-16 DIAGNOSIS — J449 Chronic obstructive pulmonary disease, unspecified: Secondary | ICD-10-CM | POA: Diagnosis not present

## 2021-02-17 NOTE — Telephone Encounter (Signed)
Requested Prescriptions  Pending Prescriptions Disp Refills  . montelukast (SINGULAIR) 10 MG tablet [Pharmacy Med Name: MONTELUKAST SODIUM 10 MG TAB] 90 tablet 1    Sig: TAKE 1 TABLET BY MOUTH DAILY     Pulmonology:  Leukotriene Inhibitors Passed - 02/16/2021  5:13 PM      Passed - Valid encounter within last 12 months    Recent Outpatient Visits          2 months ago Centrilobular emphysema ([place])   [place], DO   3 months ago Annual physical exam   [place], DO   5 months ago COVID-19 virus infection   [place], DO   9 months ago Centrilobular emphysema [facility])   [facility] [name], DO   10 months ago Centrilobular emphysema [facility])   [place], DO      Future Appointments            In 3 days  [facility], [place]   In 3 days Virgel Manifold, MD [facility] [place]   In 3 months [place], Devonne Doughty, DO [facility], [facility]

## 2021-02-20 ENCOUNTER — Ambulatory Visit (INDEPENDENT_AMBULATORY_CARE_PROVIDER_SITE_OTHER): Payer: Medicare Other | Admitting: Gastroenterology

## 2021-02-20 ENCOUNTER — Other Ambulatory Visit: Payer: Self-pay | Admitting: Family Medicine

## 2021-02-20 ENCOUNTER — Other Ambulatory Visit: Payer: Self-pay

## 2021-02-20 ENCOUNTER — Ambulatory Visit: Payer: Self-pay | Admitting: *Deleted

## 2021-02-20 ENCOUNTER — Encounter: Payer: Self-pay | Admitting: Gastroenterology

## 2021-02-20 ENCOUNTER — Ambulatory Visit (INDEPENDENT_AMBULATORY_CARE_PROVIDER_SITE_OTHER): Payer: Medicare Other

## 2021-02-20 VITALS — BP 152/80 | HR 73 | Temp 97.9°F | Wt 164.0 lb

## 2021-02-20 VITALS — Ht 64.5 in | Wt 160.0 lb

## 2021-02-20 DIAGNOSIS — Z0000 Encounter for general adult medical examination without abnormal findings: Secondary | ICD-10-CM | POA: Diagnosis not present

## 2021-02-20 DIAGNOSIS — J302 Other seasonal allergic rhinitis: Secondary | ICD-10-CM

## 2021-02-20 DIAGNOSIS — K5900 Constipation, unspecified: Secondary | ICD-10-CM | POA: Diagnosis not present

## 2021-02-20 MED ORDER — MONTELUKAST SODIUM 10 MG PO TABS
10.0000 mg | ORAL_TABLET | Freq: Every day | ORAL | 3 refills | Status: DC
Start: 2021-02-20 — End: 2021-02-26

## 2021-02-20 MED ORDER — MONTELUKAST SODIUM 10 MG PO TABS: 10.0000 mg | ORAL_TABLET | Freq: Every day | ORAL | 3 refills | Status: DC

## 2021-02-20 NOTE — Telephone Encounter (Signed)
Called received from [place]  to request singulair 10 mg Rx to be sent to pharmacy . Patient is out of medication and has been receiving #30 pills each month and not ordered #90. Requesting to resubmit medication request, due to not too soon for refill. Sending request for refill of singulair 10 mg.

## 2021-02-20 NOTE — Telephone Encounter (Signed)
Patient says out of med montelukast (SINGULAIR) 10 MG tablet. She is asking for shorty supply until refill comes in

## 2021-02-20 NOTE — Progress Notes (Signed)
I connected with Audrey Campbell today by telephone and verified that I am speaking with the correct person using two identifiers. Location patient: home Location provider: work Persons participating in the virtual visit: Audrey Campbell, Patron LPN.   I discussed the limitations, risks, security and privacy concerns of performing an evaluation and management service by telephone and the availability of in person appointments. I also discussed with the patient that there may be a patient responsible charge related to this service. The patient expressed understanding and verbally consented to this telephonic visit.    Interactive audio and video telecommunications were attempted between this provider and patient, however failed, due to patient having technical difficulties OR patient did not have access to video capability.  We continued and completed visit with audio only.     Vital signs may be patient reported or missing.  Subjective:   Audrey Campbell is a 65 y.o. female who presents for an Initial Medicare Annual Wellness Visit.  Review of Systems     Cardiac Risk Factors include: advanced age (>98men, >94 women);dyslipidemia;hypertension;sedentary lifestyle;smoking/ tobacco exposure     Objective:    Today's Vitals   02/20/21 1056  Weight: 160 lb (72.6 kg)  Height: 5' 4.5" (1.638 m)   Body mass index is 27.04 kg/m.  Advanced Directives 02/20/2021 01/24/2021 12/29/2019 12/10/2017 12/10/2017  Does Patient Have a Medical Advance Directive? No No No No No  Would patient like information on creating a medical advance directive? - - No - Patient declined No - Patient declined No - Patient declined    Current Medications (verified) Outpatient Encounter Medications as of 02/20/2021  Medication Sig   albuterol (ACCUNEB) 0.63 MG/3ML nebulizer solution Take 1 ampule by nebulization every 6 (six) hours as needed for wheezing.   atorvastatin (LIPITOR) 40 MG tablet Take 1  tablet (40 mg total) by mouth daily.   BREZTRI AEROSPHERE 160-9-4.8 MCG/ACT AERO INHALE 2 PUFFS BY MOUTH INTO THE LUNGS IN THE MORNING AND AT BEDTIME   butalbital-acetaminophen-caffeine (FIORICET) 50-325-40 MG tablet Take 1 tablet by mouth every 6 (six) hours as needed for headache.   diclofenac Sodium (VOLTAREN) 1 % GEL Apply topically daily as needed.   gabapentin (NEURONTIN) 400 MG capsule TAKE 1 CAPSULE BY MOUTH 4 TIMES DAILY   hydrOXYzine (VISTARIL) 25 MG capsule Take 1 capsule by mouth daily as needed.   levothyroxine (SYNTHROID) 200 MCG tablet Take 1 tablet (200 mcg total) by mouth daily before breakfast.   meloxicam (MOBIC) 15 MG tablet Take 1 tablet (15 mg total) by mouth daily as needed for pain.   montelukast (SINGULAIR) 10 MG tablet Take 1 tablet (10 mg total) by mouth daily.   oxycodone (OXY-IR) 5 MG capsule Take 5 mg by mouth every 4 (four) hours as needed. 1-2 tablets every 6 hours as needed   polyethylene glycol (MIRALAX / GLYCOLAX) 17 g packet Take 17 g by mouth daily as needed.   QUEtiapine (SEROQUEL) 100 MG tablet TAKE 1.5 TABLETS (150 MG TOTAL) BY MOUTHAT BEDTIME   fluticasone (FLONASE) 50 MCG/ACT nasal spray Place 2 sprays into both nostrils daily. (Patient not taking: No sig reported)   nicotine (NICODERM CQ - DOSED IN MG/24 HOURS) 14 mg/24hr patch Place 14 mg onto the skin daily. (Patient not taking: Reported on 02/20/2021)   VENTOLIN HFA 108 (90 Base) MCG/ACT inhaler Inhale 1-2 puffs into the lungs every 6 (six) hours as needed for wheezing or shortness of breath. (Patient not taking: Reported on 02/20/2021)  No facility-administered encounter medications on file as of 02/20/2021.    Allergies (verified) Sulfa antibiotics and Penicillins   History: Past Medical History:  Diagnosis Date   Anxiety    Arthritis    COPD (chronic obstructive pulmonary disease) ([place])    Hyperlipidemia    Hypertension    Hypothyroidism    On home oxygen therapy    Plantar fasciitis     Past Surgical History:  Procedure Laterality Date   bulging disc in the neck     COLONOSCOPY WITH PROPOFOL N/A 12/29/2019   Procedure: COLONOSCOPY WITH PROPOFOL;  Surgeon: Virgel Manifold, MD;  Location: [facility] ENDOSCOPY;  Service: Endoscopy;  Laterality: N/A;   COLONOSCOPY WITH PROPOFOL N/A 01/24/2021   Procedure: COLONOSCOPY WITH PROPOFOL;  Surgeon: Virgel Manifold, MD;  Location: [facility] ENDOSCOPY;  Service: Endoscopy;  Laterality: N/A;   DILATION AND CURETTAGE OF UTERUS     ESOPHAGOGASTRODUODENOSCOPY (EGD) WITH PROPOFOL N/A 12/29/2019   Procedure: ESOPHAGOGASTRODUODENOSCOPY (EGD) WITH PROPOFOL;  Surgeon: Virgel Manifold, MD;  Location: [facility] ENDOSCOPY;  Service: Endoscopy;  Laterality: N/A;   FOOT SURGERY Bilateral    heel  fasciitis     RHINOPLASTY     SHOULDER SURGERY     TUBAL LIGATION     Family History  Problem Relation Age of Onset   Diabetes Father    Stroke Father 83   Thyroid disease Father    Seizures Sister    Breast cancer Neg Hx    Social History   Socioeconomic History   Marital status: Widowed    Spouse name: Not on file   Number of children: Not on file   Years of education: high school   Highest education level: High school graduate  Occupational History   Not on file  Tobacco Use   Smoking status: Every Day    Packs/day: 0.50    Types: Cigarettes   Smokeless tobacco: Never  Vaping Use   Vaping Use: Never used  Substance and Sexual Activity   Alcohol use: No   Drug use: No   Sexual activity: Yes    Birth control/protection: None  Other Topics Concern   Not on file  Social History Narrative   Not on file   Social Determinants of Health   Financial Resource Strain: Low Risk    Difficulty of Paying Living Expenses: Not hard at all  Food Insecurity: No Food Insecurity   Worried About Charity fundraiser in the Last Year: Never true   [place] in the Last Year: Never true  Transportation Needs: No Transportation Needs   Lack of  Transportation (Medical): No   Lack of Transportation (Non-Medical): No  Physical Activity: Inactive   Days of Exercise per Week: 0 days   Minutes of Exercise per Session: 0 min  Stress: No Stress Concern Present   Feeling of Stress : Not at all  Social Connections: Not on file    Tobacco Counseling Ready to quit: Yes Counseling given: Not Answered   Clinical Intake:  Pre-visit preparation completed: Yes  Pain : No/denies pain     Nutritional Status: BMI 25 -29 Overweight Nutritional Risks: None Diabetes: No  How often do you need to have someone help you when you read instructions, pamphlets, or other written materials from your doctor or pharmacy?: 1 - Never What is the last grade level you completed in school?: GED  Diabetic? no  Interpreter Needed?: No  Information entered by :: NAllen LPN  Activities of Daily Living In your present state of health, do you have any difficulty performing the following activities: 02/20/2021  Hearing? N  Vision? N  Difficulty concentrating or making decisions? N  Walking or climbing stairs? Y  Comment due to COPD  Dressing or bathing? N  Doing errands, shopping? N  Preparing Food and eating ? N  Using the Toilet? N  In the past six months, have you accidently leaked urine? Y  Comment with cough sometimes  Do you have problems with loss of bowel control? N  Managing your Medications? N  Managing your Finances? N  Housekeeping or managing your Housekeeping? N  Some recent data might be hidden    Patient Care Team: [name], DO as PCP - General (Family Medicine) Rico Junker, RN as Registered Nurse Theodore Demark, RN as Registered Nurse Curley Spice, Virl Diamond, RPH-CPP as Pharmacist  Indicate any recent Medical Services you may have received from other than [facility] providers in the past year (date may be approximate).     Assessment:   This is a routine wellness examination for Audrey Campbell.  Hearing/Vision  screen Vision Screening - Comments:: Regular eye exams, Dr. Matilde Sprang  Dietary issues and exercise activities discussed: Current Exercise Habits: The patient does not participate in regular exercise at present   Goals Addressed             This Visit's Progress    Patient Stated       02/20/2021, quit smoking for good       Depression Screen PHQ 2/9 Scores 02/20/2021 11/21/2020 10/23/2020 05/22/2020 04/06/2020 04/06/2020 02/11/2020  PHQ - 2 Score 0 0 1 2 0 0 2  PHQ- 9 Score - 0 5 3 - - 9    Fall Risk Fall Risk  02/20/2021 11/21/2020 05/22/2020 04/06/2020 02/11/2020  Falls in the past year? 0 0 0 0 0  Number falls in past yr: - 0 0 0 0  Injury with Fall? - 0 0 0 0  Risk for fall due to : Medication side effect - - - -  Follow up Falls evaluation completed;Education provided;Falls prevention discussed Falls evaluation completed - Falls evaluation completed Falls evaluation completed    FALL RISK PREVENTION PERTAINING TO THE HOME:  Any stairs in or around the home? No  If so, are there any without handrails? N/a Home free of loose throw rugs in walkways, pet beds, electrical cords, etc? Yes  Adequate lighting in your home to reduce risk of falls? Yes   ASSISTIVE DEVICES UTILIZED TO PREVENT FALLS:  Life alert? No  Use of a cane, walker or w/c? No  Grab bars in the bathroom? Yes  Shower chair or bench in shower? No  Elevated toilet seat or a handicapped toilet? No   TIMED UP AND GO:  Was the test performed? No .      Cognitive Function:     6CIT Screen 02/20/2021  What Year? 0 points  What month? 0 points  What time? 0 points  Count back from 20 0 points  Months in reverse 0 points  Repeat phrase 0 points  Total Score 0    Immunizations Immunization History  Administered Date(s) Administered   Influenza, High Dose Seasonal PF 02/01/2021   Influenza,inj,Quad PF,6+ Mos 01/04/2017, 12/31/2018   Influenza-Unspecified 02/09/2020   Moderna Covid-19 Vaccine Bivalent  Booster 34yrs & up 02/01/2021   PFIZER(Purple Top)SARS-COV-2 Vaccination 07/15/2019, 08/11/2019, 02/21/2020   PNEUMOCOCCAL CONJUGATE-20 10/23/2020   Pneumococcal Polysaccharide-23 12/11/2017  TDAP status: Due, Education has been provided regarding the importance of this vaccine. Advised may receive this vaccine at local pharmacy or Health Dept. Aware to provide a copy of the vaccination record if obtained from local pharmacy or Health Dept. Verbalized acceptance and understanding.  Flu Vaccine status: Up to date  Pneumococcal vaccine status: Up to date  Covid-19 vaccine status: Completed vaccines  Qualifies for Shingles Vaccine? Yes   Zostavax completed No   Shingrix Completed?: No.    Education has been provided regarding the importance of this vaccine. Patient has been advised to call insurance company to determine out of pocket expense if they have not yet received this vaccine. Advised may also receive vaccine at local pharmacy or Health Dept. Verbalized acceptance and understanding.  Screening Tests Health Maintenance  Topic Date Due   TETANUS/TDAP  Never done   Zoster Vaccines- Shingrix (1 of 2) Never done   DEXA SCAN  02/20/2022 (Originally 01/04/2020)   MAMMOGRAM  03/27/2022   Fecal DNA (Cologuard)  12/03/2022   Pneumonia Vaccine 51+ Years old  Completed   INFLUENZA VACCINE  Completed   COVID-19 Vaccine  Completed   Hepatitis C Screening  Completed   HPV VACCINES  Aged Out    Health Maintenance  Health Maintenance Due  Topic Date Due   TETANUS/TDAP  Never done   Zoster Vaccines- Shingrix (1 of 2) Never done    Colorectal cancer screening: Type of screening: Colonoscopy. Completed 01/24/2021. Repeat every 10 years  Mammogram status: Completed 03/27/2020. Repeat every year  Bone Density status: decline  Lung Cancer Screening: (Low Dose CT Chest recommended if Age 75-80 years, 30 pack-year currently smoking OR have quit w/in 15years.) does not qualify.   Lung  Cancer Screening Referral: no  Additional Screening:  Hepatitis C Screening: does qualify; Completed 08/28/2016  Vision Screening: Recommended annual ophthalmology exams for early detection of glaucoma and other disorders of the eye. Is the patient up to date with their annual eye exam?  Yes  Who is the provider or what is the name of the office in which the patient attends annual eye exams? Dr. Matilde Sprang If pt is not established with a provider, would they like to be referred to a provider to establish care? No .   Dental Screening: Recommended annual dental exams for proper oral hygiene  Community Resource Referral / Chronic Care Management: CRR required this visit?  No   CCM required this visit?  No      Plan:     I have personally reviewed and noted the following in the patient's chart:   Medical and social history Use of alcohol, tobacco or illicit drugs  Current medications and supplements including opioid prescriptions. Patient is currently taking opioid prescriptions. Information provided to patient regarding non-opioid alternatives. Patient advised to discuss non-opioid treatment plan with their provider. Functional ability and status Nutritional status Physical activity Advanced directives List of other physicians Hospitalizations, surgeries, and ER visits in previous 12 months Vitals Screenings to include cognitive, depression, and falls Referrals and appointments  In addition, I have reviewed and discussed with patient certain preventive protocols, quality metrics, and best practice recommendations. A written personalized care plan for preventive services as well as general preventive health recommendations were provided to patient.     Kellie Simmering, LPN   [id number]   Nurse Notes:

## 2021-02-20 NOTE — Telephone Encounter (Signed)
Fixed quantity 90 day

## 2021-02-20 NOTE — Patient Instructions (Signed)
Audrey Campbell , Thank you for taking time to come for your Medicare Wellness Visit. I appreciate your ongoing commitment to your health goals. Please review the following plan we discussed and let me know if I can assist you in the future.   Screening recommendations/referrals: Colonoscopy: completed 01/24/2021 Mammogram: completed 03/27/2020 Bone Density: declined Recommended yearly ophthalmology/optometry visit for glaucoma screening and checkup Recommended yearly dental visit for hygiene and checkup  Vaccinations: Influenza vaccine: completed 02/01/2021 Pneumococcal vaccine: completed 10/23/2020 Tdap vaccine: due Shingles vaccine: discussed   Covid-19: 02/01/2021, 02/21/2020, 08/11/2019, 07/15/2019  Advanced directives: Advance directive discussed with you today.   Conditions/risks identified: smoking  Next appointment: Follow up in one year for your annual wellness visit    Preventive Care 38 Years and Older, Female Preventive care refers to lifestyle choices and visits with your health care provider that can promote health and wellness. What does preventive care include? A yearly physical exam. This is also called an annual well check. Dental exams once or twice a year. Routine eye exams. Ask your health care provider how often you should have your eyes checked. Personal lifestyle choices, including: Daily care of your teeth and gums. Regular physical activity. Eating a healthy diet. Avoiding tobacco and drug use. Limiting alcohol use. Practicing safe sex. Taking low-dose aspirin every day. Taking vitamin and mineral supplements as recommended by your health care provider. What happens during an annual well check? The services and screenings done by your health care provider during your annual well check will depend on your age, overall health, lifestyle risk factors, and family history of disease. Counseling  Your health care provider may ask you questions about  your: Alcohol use. Tobacco use. Drug use. Emotional well-being. Home and relationship well-being. Sexual activity. Eating habits. History of falls. Memory and ability to understand (cognition). Work and work Statistician. Reproductive health. Screening  You may have the following tests or measurements: Height, weight, and BMI. Blood pressure. Lipid and cholesterol levels. These may be checked every 5 years, or more frequently if you are over 35 years old. Skin check. Lung cancer screening. You may have this screening every year starting at age 74 if you have a 30-pack-year history of smoking and currently smoke or have quit within the past 15 years. Fecal occult blood test (FOBT) of the stool. You may have this test every year starting at age 54. Flexible sigmoidoscopy or colonoscopy. You may have a sigmoidoscopy every 5 years or a colonoscopy every 10 years starting at age 75. Hepatitis C blood test. Hepatitis B blood test. Sexually transmitted disease (STD) testing. Diabetes screening. This is done by checking your blood sugar (glucose) after you have not eaten for a while (fasting). You may have this done every 1-3 years. Bone density scan. This is done to screen for osteoporosis. You may have this done starting at age 44. Mammogram. This may be done every 1-2 years. Talk to your health care provider about how often you should have regular mammograms. Talk with your health care provider about your test results, treatment options, and if necessary, the need for more tests. Vaccines  Your health care provider may recommend certain vaccines, such as: Influenza vaccine. This is recommended every year. Tetanus, diphtheria, and acellular pertussis (Tdap, Td) vaccine. You may need a Td booster every 10 years. Zoster vaccine. You may need this after age 14. Pneumococcal 13-valent conjugate (PCV13) vaccine. One dose is recommended after age 30. Pneumococcal polysaccharide (PPSV23) vaccine.  One dose is recommended  after age 55. Talk to your health care provider about which screenings and vaccines you need and how often you need them. This information is not intended to replace advice given to you by your health care provider. Make sure you discuss any questions you have with your health care provider. Document Released: 05/12/2015 Document Revised: 01/03/2016 Document Reviewed: 02/14/2015 Elsevier Interactive Patient Education  2017 [place] Prevention in the Home Falls can cause injuries. They can happen to people of all ages. There are many things you can do to make your home safe and to help prevent falls. What can I do on the outside of my home? Regularly fix the edges of walkways and driveways and fix any cracks. Remove anything that might make you trip as you walk through a door, such as a raised step or threshold. Trim any bushes or trees on the path to your home. Use bright outdoor lighting. Clear any walking paths of anything that might make someone trip, such as rocks or tools. Regularly check to see if handrails are loose or broken. Make sure that both sides of any steps have handrails. Any raised decks and porches should have guardrails on the edges. Have any leaves, snow, or ice cleared regularly. Use sand or salt on walking paths during winter. Clean up any spills in your garage right away. This includes oil or grease spills. What can I do in the bathroom? Use night lights. Install grab bars by the toilet and in the tub and shower. Do not use towel bars as grab bars. Use non-skid mats or decals in the tub or shower. If you need to sit down in the shower, use a plastic, non-slip stool. Keep the floor dry. Clean up any water that spills on the floor as soon as it happens. Remove soap buildup in the tub or shower regularly. Attach bath mats securely with double-sided non-slip rug tape. Do not have throw rugs and other things on the floor that can make  you trip. What can I do in the bedroom? Use night lights. Make sure that you have a light by your bed that is easy to reach. Do not use any sheets or blankets that are too big for your bed. They should not hang down onto the floor. Have a firm chair that has side arms. You can use this for support while you get dressed. Do not have throw rugs and other things on the floor that can make you trip. What can I do in the kitchen? Clean up any spills right away. Avoid walking on wet floors. Keep items that you use a lot in easy-to-reach places. If you need to reach something above you, use a strong step stool that has a grab bar. Keep electrical cords out of the way. Do not use floor polish or wax that makes floors slippery. If you must use wax, use non-skid floor wax. Do not have throw rugs and other things on the floor that can make you trip. What can I do with my stairs? Do not leave any items on the stairs. Make sure that there are handrails on both sides of the stairs and use them. Fix handrails that are broken or loose. Make sure that handrails are as long as the stairways. Check any carpeting to make sure that it is firmly attached to the stairs. Fix any carpet that is loose or worn. Avoid having throw rugs at the top or bottom of the stairs. If you do  have throw rugs, attach them to the floor with carpet tape. Make sure that you have a light switch at the top of the stairs and the bottom of the stairs. If you do not have them, ask someone to add them for you. What else can I do to help prevent falls? Wear shoes that: Do not have high heels. Have rubber bottoms. Are comfortable and fit you well. Are closed at the toe. Do not wear sandals. If you use a stepladder: Make sure that it is fully opened. Do not climb a closed stepladder. Make sure that both sides of the stepladder are locked into place. Ask someone to hold it for you, if possible. Clearly mark and make sure that you can  see: Any grab bars or handrails. First and last steps. Where the edge of each step is. Use tools that help you move around (mobility aids) if they are needed. These include: Canes. Walkers. Scooters. Crutches. Turn on the lights when you go into a dark area. Replace any light bulbs as soon as they burn out. Set up your furniture so you have a clear path. Avoid moving your furniture around. If any of your floors are uneven, fix them. If there are any pets around you, be aware of where they are. Review your medicines with your doctor. Some medicines can make you feel dizzy. This can increase your chance of falling. Ask your doctor what other things that you can do to help prevent falls. This information is not intended to replace advice given to you by your health care provider. Make sure you discuss any questions you have with your health care provider. Document Released: 02/09/2009 Document Revised: 09/21/2015 Document Reviewed: 05/20/2014 Elsevier Interactive Patient Education  2017 [employer].

## 2021-02-20 NOTE — Telephone Encounter (Signed)
Out of medication. Patient receives #30 instead of #90. Requested Prescriptions  Pending Prescriptions Disp Refills  . montelukast (SINGULAIR) 10 MG tablet [Pharmacy Med Name: MONTELUKAST SODIUM 10 MG TAB] 30 tablet 0    Sig: TAKE 1 TABLET BY MOUTH DAILY     Pulmonology:  Leukotriene Inhibitors Passed - 02/20/2021 12:20 PM      Passed - Valid encounter within last 12 months    Recent Outpatient Visits          3 months ago Centrilobular emphysema ([place])   [place], DO   4 months ago Annual physical exam   [place], DO   5 months ago COVID-19 virus infection   [place], DO   9 months ago Centrilobular emphysema [facility])   [facility] [name], DO   10 months ago Centrilobular emphysema [facility])   [facility] [name], DO      Future Appointments            Today Virgel Manifold, MD [facility] [place]   In 3 months Parks Ranger, Devonne Doughty, DO [facility], [place]   In 1 year  [facility], [facility]

## 2021-02-20 NOTE — Telephone Encounter (Signed)
Reason for Disposition  Pharmacy with prescription refill question and triager answers question  Answer Assessment - Initial Assessment Questions 1. DRUG NAME: "What medicine do you need to have refilled?"     singular 2. REFILLS REMAINING: "How many refills are remaining?" (Note: The label on the medicine or pill bottle will show how many refills are remaining. If there are no refills remaining, then a renewal may be needed.)     None patient receives #30 at a time and not ordered #90 3. EXPIRATION DATE: "What is the expiration date?" (Note: The label states when the prescription will expire, and thus can no longer be refilled.)     na 4. PRESCRIBING HCP: "Who prescribed it?" Reason: If prescribed by specialist, call should be referred to that group.     PCP 5. SYMPTOMS: "Do you have any symptoms?"     No . Out of medication 6. PREGNANCY: "Is there any chance that you are pregnant?" "When was your last menstrual period?"     na  Protocols used: Medication Refill and Renewal Call-A-AH

## 2021-02-21 NOTE — Progress Notes (Signed)
Audrey Antigua, MD [address]  [place]  [place], [place] [address]  Main: [phone]  Fax: [phone]   Primary Care Physician: [name], DO   Chief complaint: Constipation  HPI: Audrey Campbell is a 65 y.o. female here for follow-up of constipation.  Patient reports doing well and is not having any further constipation.  Reports 1 formed bowel movement daily at this time.  No blood in stool.  Is trying to eat a high-fiber diet.  No abdominal pain.  No nausea or vomiting.  Good appetite.  Patient was previously Cologuard positive and underwent initial colonoscopy for this in September 2021.  This showed a poor prep.  She did not get a repeat colonoscopy done until September 2022.  This showed diverticulosis, lipoma in the ascending colon and cecum.  Otherwise normal exam.  She is also undergone EGD in September 2021  ROS: All ROS reviewed and negative except as per HPI   Past Medical History:  Diagnosis Date   Anxiety    Arthritis    COPD (chronic obstructive pulmonary disease) ([place])    Hyperlipidemia    Hypertension    Hypothyroidism    On home oxygen therapy    Plantar fasciitis     Past Surgical History:  Procedure Laterality Date   bulging disc in the neck     COLONOSCOPY WITH PROPOFOL N/A 12/29/2019   Procedure: COLONOSCOPY WITH PROPOFOL;  Surgeon: Virgel Manifold, MD;  Location: [facility] ENDOSCOPY;  Service: Endoscopy;  Laterality: N/A;   COLONOSCOPY WITH PROPOFOL N/A 01/24/2021   Procedure: COLONOSCOPY WITH PROPOFOL;  Surgeon: Virgel Manifold, MD;  Location: [facility] ENDOSCOPY;  Service: Endoscopy;  Laterality: N/A;   DILATION AND CURETTAGE OF UTERUS     ESOPHAGOGASTRODUODENOSCOPY (EGD) WITH PROPOFOL N/A 12/29/2019   Procedure: ESOPHAGOGASTRODUODENOSCOPY (EGD) WITH PROPOFOL;  Surgeon: Virgel Manifold, MD;  Location: [facility] ENDOSCOPY;  Service: Endoscopy;  Laterality: N/A;   FOOT SURGERY Bilateral    heel  fasciitis      RHINOPLASTY     SHOULDER SURGERY     TUBAL LIGATION      Prior to Admission medications   Medication Sig Start Date End Date Taking? Authorizing Provider  albuterol (ACCUNEB) 0.63 MG/3ML nebulizer solution Take 1 ampule by nebulization every 6 (six) hours as needed for wheezing.   Yes [provider]  atorvastatin (LIPITOR) 40 MG tablet Take 1 tablet (40 mg total) by mouth daily. 10/06/19  Yes Karamalegos, Devonne Doughty, DO  BREZTRI AEROSPHERE 160-9-4.8 MCG/ACT AERO INHALE 2 PUFFS BY MOUTH INTO THE LUNGS IN THE MORNING AND AT BEDTIME 06/06/20  Yes Kathrine Haddock, NP  butalbital-acetaminophen-caffeine (FIORICET) [phone] MG tablet Take 1 tablet by mouth every 6 (six) hours as needed for headache. 12/06/20  Yes Karamalegos, Devonne Doughty, DO  diclofenac Sodium (VOLTAREN) 1 % GEL Apply topically daily as needed.   Yes [provider]  fluticasone (FLONASE) 50 MCG/ACT nasal spray Place 2 sprays into both nostrils daily. 11/21/20  Yes Karamalegos, Devonne Doughty, DO  gabapentin (NEURONTIN) 400 MG capsule TAKE 1 CAPSULE BY MOUTH 4 TIMES DAILY 01/18/21  Yes Karamalegos, Devonne Doughty, DO  hydrOXYzine (VISTARIL) 25 MG capsule Take 1 capsule by mouth daily as needed. 11/24/19  Yes [provider]  levothyroxine (SYNTHROID) 200 MCG tablet Take 1 tablet (200 mcg total) by mouth daily before breakfast. 12/05/20  Yes Karamalegos, Devonne Doughty, DO  meloxicam (MOBIC) 15 MG tablet Take 1 tablet (15 mg total) by mouth daily  as needed for pain. 05/03/20  Yes Karamalegos, Devonne Doughty, DO  nicotine (NICODERM CQ - DOSED IN MG/24 HOURS) 14 mg/24hr patch Place 14 mg onto the skin daily.   Yes [provider]  oxycodone (OXY-IR) 5 MG capsule Take 5 mg by mouth every 4 (four) hours as needed. 1-2 tablets every 6 hours as needed   Yes [provider]  polyethylene glycol (MIRALAX / GLYCOLAX) 17 g packet Take 17 g by mouth daily as needed.   Yes [provider]  QUEtiapine (SEROQUEL) 100 MG  tablet TAKE 1.5 TABLETS (150 MG TOTAL) BY MOUTHAT BEDTIME 08/14/20  Yes Karamalegos, Alexander J, DO  VENTOLIN HFA 108 (90 Base) MCG/ACT inhaler Inhale 1-2 puffs into the lungs every 6 (six) hours as needed for wheezing or shortness of breath. 02/12/21  Yes Karamalegos, Alexander J, DO  montelukast (SINGULAIR) 10 MG tablet Take 1 tablet (10 mg total) by mouth daily. 02/20/21   [name], DO    Family History  Problem Relation Age of Onset   Diabetes Father    Stroke Father 27   Thyroid disease Father    Seizures Sister    Breast cancer Neg Hx      Social History   Tobacco Use   Smoking status: Every Day    Packs/day: 0.50    Types: Cigarettes   Smokeless tobacco: Never  Vaping Use   Vaping Use: Never used  Substance Use Topics   Alcohol use: No   Drug use: No    Allergies as of 02/20/2021 - Review Complete 02/20/2021  Allergen Reaction Noted   Sulfa antibiotics  06/23/2020   Penicillins Rash and Other (See Comments) 02/23/2013    Physical Examination:  Constitutional: General:   Alert,  Well-developed, well-nourished, pleasant and cooperative in NAD BP (!) 152/80   Pulse 73   Temp 97.9 F (36.6 C) (Oral)   Wt 164 lb (74.4 kg)   BMI 27.72 kg/m   Respiratory: Normal respiratory effort  Gastrointestinal:  Soft, non-tender and non-distended without masses, hepatosplenomegaly or hernias noted.  No guarding or rebound tenderness.     Cardiac: No clubbing or edema.  No cyanosis. Normal posterior tibial pedal pulses noted.  Psych:  Alert and cooperative. Normal mood and affect.  Musculoskeletal:  Normal gait. Head normocephalic, atraumatic. Symmetrical without gross deformities. 5/5 Lower extremity strength bilaterally.  Skin: Warm. Intact without significant lesions or rashes. No jaundice.  Neck: Supple, trachea midline  Lymph: No cervical lymphadenopathy  Psych:  Alert and oriented x3, Alert and cooperative. Normal mood and  affect.  Labs: CMP     Component Value Date/Time   NA 139 10/16/2020 0858   K 4.5 10/16/2020 0858   CL 102 10/16/2020 0858   CO2 30 10/16/2020 0858   GLUCOSE 92 10/16/2020 0858   BUN 9 10/16/2020 0858   CREATININE 0.93 10/16/2020 0858   CALCIUM 9.8 10/16/2020 0858   PROT 7.1 10/16/2020 0858   AST 17 10/16/2020 0858   ALT 9 10/16/2020 0858   BILITOT 0.6 10/16/2020 0858   GFRNONAA 64 10/16/2020 0858   GFRAA 75 10/16/2020 0858   Lab Results  Component Value Date   WBC 7.2 10/16/2020   HGB 12.3 10/16/2020   HCT 39.5 10/16/2020   MCV 84.0 10/16/2020   PLT 311 10/16/2020    Imaging Studies:   Assessment and Plan:   EVOLETT SOMARRIBA is a 65 y.o. y/o female here for follow-up of constipation  Constipation has resolved at  this time and patient is reporting 1 formed bowel movement a day  She states she takes MiraLAX and this helps If constipation returns, patient advised to call us back  We discussed options of future colonoscopies for the patient in detail with her.  Based on literature in uptodate "While some clinicians may be concerned that a positive MT-sDNA test in the setting of a normal colonoscopy may represent a true rather than a false positive, the available data suggest that such patients do not have a higher risk of cancer than the general population [60]. While this suggests that returning to the routine screening schedule is reasonable for patients with an apparent false-positive MT-sDNA test, larger studies with longer follow-up are needed to confirm these findings."  Her Cologuard test was likely false positive.  We discussed option of repeat colonoscopy in 10 years, versus an earlier exam such as 3 to 5 years given her previously positive Cologuard test.  Patient prefers to have another colonoscopy in 3 to 5 years.  Recall will be placed in her chart based on our discussion with her today.  Does not need a repeat exam at this time given that most recent  colonoscopy did show a good prep and did not have any precancerous lesions   Dr Audrey Campbell

## 2021-02-23 ENCOUNTER — Other Ambulatory Visit: Payer: Self-pay | Admitting: Family Medicine

## 2021-02-23 DIAGNOSIS — F5104 Psychophysiologic insomnia: Secondary | ICD-10-CM

## 2021-02-23 NOTE — Telephone Encounter (Signed)
Requested medication (s) are due for refill today Yes  Requested medication (s) are on the active medication list  yes  Future visit scheduled yes for 05/25/21  Note to clinic-Medication is not delegated. Routing to physician for review.    Requested Prescriptions  Pending Prescriptions Disp Refills   QUEtiapine (SEROQUEL) 100 MG tablet [Pharmacy Med Name: QUETIAPINE FUMARATE 100 MG TAB] 135 tablet 1    Sig: TAKE 1.5 TABLETS (150 MG TOTAL) BY MOUTHAT BEDTIME     Not Delegated - Psychiatry:  Antipsychotics - Second Generation (Atypical) - quetiapine Failed - 02/23/2021  9:22 AM      Failed - This refill cannot be delegated      Failed - Last BP in normal range    BP Readings from Last 1 Encounters:  02/20/21 (!) 152/80          Passed - ALT in normal range and within 180 days    ALT  Date Value Ref Range Status  10/16/2020 9 6 - 29 U/L Final          Passed - AST in normal range and within 180 days    AST  Date Value Ref Range Status  10/16/2020 17 10 - 35 U/L Final          Passed - Completed PHQ-2 or PHQ-9 in the last 360 days      Passed - Valid encounter within last 6 months    Recent Outpatient Visits           3 months ago Centrilobular emphysema ([place])   [place], DO   4 months ago Annual physical exam   [place], DO   5 months ago COVID-19 virus infection   [place], DO   9 months ago Centrilobular emphysema [facility])   [facility] [name], DO   10 months ago Centrilobular emphysema [facility])   [facility] Parks Ranger, Devonne Doughty, DO       Future Appointments             In 3 months Parks Ranger, Devonne Doughty, DO [facility], [facility]   In 1 year  [facility], [facility]

## 2021-02-26 ENCOUNTER — Other Ambulatory Visit: Payer: Self-pay | Admitting: Family Medicine

## 2021-02-26 ENCOUNTER — Other Ambulatory Visit: Payer: Self-pay

## 2021-02-26 DIAGNOSIS — Z1231 Encounter for screening mammogram for malignant neoplasm of breast: Secondary | ICD-10-CM

## 2021-02-26 DIAGNOSIS — J302 Other seasonal allergic rhinitis: Secondary | ICD-10-CM

## 2021-02-26 DIAGNOSIS — J432 Centrilobular emphysema: Secondary | ICD-10-CM | POA: Diagnosis not present

## 2021-02-26 MED ORDER — MONTELUKAST SODIUM 10 MG PO TABS: 10.0000 mg | ORAL_TABLET | Freq: Every day | ORAL | 3 refills | Status: DC

## 2021-03-05 DIAGNOSIS — J449 Chronic obstructive pulmonary disease, unspecified: Secondary | ICD-10-CM | POA: Diagnosis not present

## 2021-03-07 ENCOUNTER — Ambulatory Visit (INDEPENDENT_AMBULATORY_CARE_PROVIDER_SITE_OTHER): Payer: Medicare Other | Admitting: Pharmacist

## 2021-03-07 DIAGNOSIS — E039 Hypothyroidism, unspecified: Secondary | ICD-10-CM

## 2021-03-07 DIAGNOSIS — J432 Centrilobular emphysema: Secondary | ICD-10-CM

## 2021-03-07 DIAGNOSIS — F17200 Nicotine dependence, unspecified, uncomplicated: Secondary | ICD-10-CM

## 2021-03-07 NOTE — Patient Instructions (Signed)
Visit Information  Please check your home blood pressure, keep a log of the results and bring this with you to your medical appointments  Please use your weekly pillbox for organizing your medications  Please consider calling the [place] Quitline for their help with quitting smoking.  The [place] Quitline phone number is: [phone]  Feel free to call me with any questions or concerns. I look forward to our next call!  Wallace Cullens, PharmD, [place] [phone]  The patient verbalized understanding of instructions, educational materials, and care plan provided today and declined offer to receive copy of patient instructions, educational materials, and care plan.   Telephone follow up appointment with care management team member scheduled for: 12/14 at 1:15 pm

## 2021-03-07 NOTE — Chronic Care Management (AMB) (Signed)
Chronic Care Management Pharmacy Note  03/07/2021 Name:  Audrey Campbell MRN:  [id number] DOB:  23-Apr-1956   Subjective: Audrey Campbell is an 65 y.o. year old female who is a primary patient of [name], DO.  The CCM team was consulted for assistance with disease management and care coordination needs.    Engaged with patient by telephone for follow up visit in response to provider referral for pharmacy case management and/or care coordination services.   Consent to Services:  The patient was given information about Chronic Care Management services, agreed to services, and gave verbal consent prior to initiation of services.  Please see initial visit note for detailed documentation.   Patient Care Team: [name], DO as PCP - General (Family Medicine) Rico Junker, RN as Registered Nurse Theodore Demark, RN as Registered Nurse Curley Spice Virl Diamond, RPH-CPP as Pharmacist  Recent consult visits: Office Visit with [facility] on 10/25 for follow up  Objective:  Lab Results  Component Value Date   CREATININE 0.93 10/16/2020   CREATININE 0.95 10/06/2019   CREATININE 0.90 12/09/2017    Lab Results  Component Value Date   HGBA1C 5.6 10/16/2020   Last diabetic Eye exam: No results found for: HMDIABEYEEXA  Last diabetic Foot exam: No results found for: HMDIABFOOTEX      Component Value Date/Time   CHOL 201 (H) 10/16/2020 0858   TRIG 130 10/16/2020 0858   HDL 47 (L) 10/16/2020 0858   CHOLHDL 4.3 10/16/2020 0858   LDLCALC 129 (H) 10/16/2020 0858    Hepatic Function Latest Ref Rng & Units 10/16/2020 10/06/2019  Total Protein 6.1 - 8.1 g/dL 7.1 7.0  AST 10 - 35 U/L 17 14  ALT 6 - 29 U/L 9 10  Total Bilirubin 0.2 - 1.2 mg/dL 0.6 0.4    Lab Results  Component Value Date/Time   TSH 3.37 10/16/2020 08:58 AM   TSH 4.01 10/06/2019 10:02 AM    Social History   Tobacco Use  Smoking Status Every Day   Packs/day: 0.50   Types:  Cigarettes  Smokeless Tobacco Never   BP Readings from Last 3 Encounters:  02/20/21 (!) 152/80  01/24/21 (!) 159/79  11/21/20 115/72   Pulse Readings from Last 3 Encounters:  02/20/21 73  01/24/21 68  11/21/20 81   Wt Readings from Last 3 Encounters:  02/20/21 164 lb (74.4 kg)  02/20/21 160 lb (72.6 kg)  01/24/21 162 lb (73.5 kg)    Assessment: Review of patient past medical history, allergies, medications, health status, including review of consultants reports, laboratory and other test data, was performed as part of comprehensive evaluation and provision of chronic care management services.   SDOH:  (Social Determinants of Health) assessments and interventions performed:    CCM Care Plan  Allergies  Allergen Reactions   Sulfa Antibiotics     Reports had reaction ~20 years ago   Penicillins Rash and Other (See Comments)    Has patient had a PCN reaction causing immediate rash, facial/tongue/throat swelling, SOB or lightheadedness with hypotension: No Has patient had a PCN reaction causing severe rash involving mucus membranes or skin necrosis: No Has patient had a PCN reaction that required hospitalization: No Has patient had a PCN reaction occurring within the last 10 years: Yes If all of the above answers are "NO", then may proceed with Cephalosporin use.     Medications Reviewed Today     Reviewed by Lurlean Nanny,  CMA Estate agent) on 02/20/21 at [place]  Med List Status: <None>   Medication Order Taking? Sig Documenting Provider Last Dose Status Informant  albuterol (ACCUNEB) 0.63 MG/3ML nebulizer solution [id number] Yes Take 1 ampule by nebulization every 6 (six) hours as needed for wheezing. [provider] Taking Active   atorvastatin (LIPITOR) 40 MG tablet [id number] Yes Take 1 tablet (40 mg total) by mouth daily. [name], DO Taking Active   BREZTRI AEROSPHERE 160-9-4.8 MCG/ACT AERO [id number] Yes INHALE 2 PUFFS BY  MOUTH INTO THE LUNGS IN THE MORNING AND AT BEDTIME Kathrine Haddock, NP Taking Active   butalbital-acetaminophen-caffeine (FIORICET) 50-325-40 MG tablet [id number] Yes Take 1 tablet by mouth every 6 (six) hours as needed for headache. [name], DO Taking Active   diclofenac Sodium (VOLTAREN) 1 % GEL [id number] Yes Apply topically daily as needed. [provider] Taking Active   fluticasone (FLONASE) 50 MCG/ACT nasal spray [id number] Yes Place 2 sprays into both nostrils daily. [name], DO Taking Active   gabapentin (NEURONTIN) 400 MG capsule [id number] Yes TAKE 1 CAPSULE BY MOUTH 4 TIMES DAILY Karamalegos, Devonne Doughty, DO Taking Active   hydrOXYzine (VISTARIL) 25 MG capsule [id number] Yes Take 1 capsule by mouth daily as needed. [provider] Taking Active   levothyroxine (SYNTHROID) 200 MCG tablet [id number] Yes Take 1 tablet (200 mcg total) by mouth daily before breakfast. [name], DO Taking Active   meloxicam (MOBIC) 15 MG tablet [id number] Yes Take 1 tablet (15 mg total) by mouth daily as needed for pain. [name], DO Taking Active   montelukast (SINGULAIR) 10 MG tablet [id number] Yes Take 1 tablet (10 mg total) by mouth daily. Karamalegos, Devonne Doughty, DO Taking Active   nicotine (NICODERM CQ - DOSED IN MG/24 HOURS) 14 mg/24hr patch [id number] Yes Place 14 mg onto the skin daily. [provider] Taking Active   oxycodone (OXY-IR) 5 MG capsule [id number] Yes Take 5 mg by mouth every 4 (four) hours as needed. 1-2 tablets every 6 hours as needed [provider] Taking Active   polyethylene glycol (MIRALAX / GLYCOLAX) 17 g packet [id number] Yes Take 17 g by mouth daily as needed. [provider] Taking Active   QUEtiapine (SEROQUEL) 100 MG tablet [id number] Yes TAKE 1.5 TABLETS (150 MG TOTAL) BY MOUTHAT BEDTIME Karamalegos, Devonne Doughty, DO Taking Active   VENTOLIN HFA 108 (90 Base) MCG/ACT inhaler  [id number] Yes Inhale 1-2 puffs into the lungs every 6 (six) hours as needed for wheezing or shortness of breath. [name], DO Taking Active             Patient Active Problem List   Diagnosis Date Noted   Lipoma of colon    Abdominal pain, generalized    Heartburn    Positive colorectal cancer screening using Cologuard test    Old tear of meniscus of right knee 11/09/2019   Mixed hyperlipidemia 10/06/2019   Essential hypertension 10/06/2019   Osteoarthritis of spine with radiculopathy, cervical region 10/06/2019   Chronic pain syndrome 10/06/2019   Major depressive disorder, recurrent, in partial remission ([place]) 07/09/2019   Psychophysiological insomnia 07/09/2019   GAD (generalized anxiety disorder) 07/09/2019   Primary osteoarthritis involving multiple joints 07/09/2019   Chronic pain of right knee 07/09/2019   Chronic bilateral low back pain without sciatica 07/09/2019   Centrilobular emphysema ([place]) 07/09/2019   Obesity 07/16/2017   Hypothyroidism 07/16/2017   Smoker 07/16/2017   Plantar fasciitis, bilateral 07/16/2017  Immunization History  Administered Date(s) Administered   Influenza, High Dose Seasonal PF 02/01/2021   Influenza,inj,Quad PF,6+ Mos 01/04/2017, 12/31/2018   Influenza-Unspecified 02/09/2020   Moderna Covid-19 Vaccine Bivalent Booster 24yrs & up 02/01/2021   PFIZER(Purple Top)SARS-COV-2 Vaccination 07/15/2019, 08/11/2019, 02/21/2020   PNEUMOCOCCAL CONJUGATE-20 10/23/2020   Pneumococcal Polysaccharide-23 12/11/2017    Conditions to be addressed/monitored: HLD, HTN, COPD, hypothyroidism, tobacco use  Care Plan : PharmD - Medication Assistance  Updates made by Rennis Petty, RPH-CPP since 03/07/2021 12:00 AM     Problem: Disease Progression      Long-Range Goal: Disease Progression Prevented or Minimized   Start Date: 06/23/2020  Expected End Date: 09/21/2020  This Visit's Progress: On track  Recent Progress: On track   Priority: High  Note:   Current Barriers:  Unable to independently afford treatment regimen Reports difficulty with affording copayment for Breztri inhaler through [place] [facility] Patient APPROVED for patient assistance for Breztri through [employer] through 04/28/2021  Pharmacist Clinical Goal(s):  Over the next 90 days, patient will verbalize ability to afford treatment regimen through collaboration with PharmD and provider.   Interventions: 1:1 collaboration with [name], DO regarding development and update of comprehensive plan of care as evidenced by provider attestation and co-signature Inter-disciplinary care team collaboration (see longitudinal plan of care)  Chronic Obstructive Pulmonary Disease: Current treatment: Breztri inhaler - 2 puffs into lungs twice daily Albuterol nebulizer solution/albuterol inhaler as needed Today patient reports she is having a problem with her albuterol inhaler -she has >80 puffs remaining, but nothing coming out.  Counsel patient on how to clean inhaler. Patient cleans and dries actuator only (not canister) as directed during our call and reports device now working correctly Identify patient currently using albuterol inhaler ~3 times/day. Find patient has NOT been using maintenance, Breztri, inhaler on a scheduled basis. Counsel patient to start using Breztri inhaler - 2 puffs each morning and 2 puffs every evening as directed, not as needed. Remind patient to rinse out mouth after each use  Tobacco Abuse: Wants to quit, but not ready right now Currently smoking: ~10 cigarettes/day Current medication: has, but not currently using: nicotine patch 14 mg/day Previous therapies tried: gum, lozenge - did not like the taste Motivation: "I want to breathe more than I want to smoke"; bad taste of cigarettes Triggers: wanting to hold cigarette; being around others who are smoking Strategies: holding pen in hand; use of hard  candy Working with [employer] [phone]), but got off track Enocurage patient to engage with [place] Quitline again  Medication Assistance: Reports has sufficient supply of Breztri to last until end of calendar year Collaborate with [place] for assistance to patient with renewal of Rx to patient assistance program for 2023 calendar year Note patient not required to complete an application for 2725 calendar year, but [facility] CPhT will obtain prescriber portion from PCP and submit to company  Hyperlipidemia Uncontrolled; current treatment: atorvastatin 40 mg daily Have counseled patient on rational for/importance of adherence to atorvastatin/LDL lowering   Medication Adherence: Encouraged patient to continue to use weekly pillbox for scheduled medications as adherence aid  Patient Goals/Self-Care Activities Over the next 90 days, patient will:  - take medications as prescribed - focus on medication adherence by using weekly pillbox - collaborate with provider on medication access solutions  Follow Up Plan: Will keep telephone follow up appointment with care management team member as previously scheduled for: 12/14 at 1:15 pm  Patient's preferred pharmacy is:  [place] [address] [place] [address] Phone: [phone] Fax: [phone]   Follow Up:  Patient agrees to Care Plan and Follow-up.  Wallace Cullens, PharmD, [place], CPP Clinical Pharmacist [facility] [phone]

## 2021-03-19 ENCOUNTER — Telehealth: Payer: Self-pay | Admitting: Pharmacy Technician

## 2021-03-19 DIAGNOSIS — J449 Chronic obstructive pulmonary disease, unspecified: Secondary | ICD-10-CM | POA: Diagnosis not present

## 2021-03-19 DIAGNOSIS — Z596 Low income: Secondary | ICD-10-CM

## 2021-03-19 NOTE — Progress Notes (Signed)
[facility])                                            [place] Team    03/19/2021  AELLA RONDA 02-05-1956 [id number]  FOR 2023 RE ENROLLMENT                                      Medication Assistance Referral  Referral From: [facility] Embedded RPh Dorthula Perfect   Medication/Company: Judithann Sauger / [employer] Patient application portion:  N/A already enrolled for 3614 Provider application portion: Faxed  to Dr. Parks Ranger Provider address/fax verified via: Office website     Luisa Louk P. Victor Langenbach, [place]  [phone]

## 2021-03-20 ENCOUNTER — Telehealth: Payer: Self-pay | Admitting: Pharmacy Technician

## 2021-03-20 DIAGNOSIS — Z596 Low income: Secondary | ICD-10-CM

## 2021-03-20 NOTE — Progress Notes (Signed)
[facility])                                            [place] Team    03/20/2021  ARIAM MOL 1956-02-06 [id number]  Received provider portion(s) of patient assistance application(s) for [facility]. Faxed completed application and required documents into [employer].    Nickalas Mccarrick P. Solymar Grace, [place]  [phone]

## 2021-03-28 ENCOUNTER — Other Ambulatory Visit: Payer: Self-pay

## 2021-03-28 ENCOUNTER — Ambulatory Visit
Admission: RE | Admit: 2021-03-28 | Discharge: 2021-03-28 | Disposition: A | Discharge status: Home / Self Care | Payer: Medicare Other | Source: Ambulatory Visit | Attending: Family Medicine | Admitting: Family Medicine

## 2021-03-28 DIAGNOSIS — J432 Centrilobular emphysema: Secondary | ICD-10-CM

## 2021-03-28 DIAGNOSIS — Z1231 Encounter for screening mammogram for malignant neoplasm of breast: Secondary | ICD-10-CM | POA: Diagnosis not present

## 2021-03-28 DIAGNOSIS — E039 Hypothyroidism, unspecified: Secondary | ICD-10-CM | POA: Diagnosis not present

## 2021-04-11 ENCOUNTER — Ambulatory Visit (INDEPENDENT_AMBULATORY_CARE_PROVIDER_SITE_OTHER): Payer: Medicare Other | Admitting: Pharmacist

## 2021-04-11 DIAGNOSIS — E039 Hypothyroidism, unspecified: Secondary | ICD-10-CM

## 2021-04-11 DIAGNOSIS — F17200 Nicotine dependence, unspecified, uncomplicated: Secondary | ICD-10-CM

## 2021-04-11 DIAGNOSIS — J432 Centrilobular emphysema: Secondary | ICD-10-CM

## 2021-04-11 NOTE — Patient Instructions (Signed)
Visit Information  Thank you for taking time to visit with me today. Please don't hesitate to contact me if I can be of assistance to you before our next scheduled telephone appointment.  Following are the goals we discussed today:   Goals Addressed             This Visit's Progress    Pharmacy Goals       Please check your home blood pressure, keep a log of the results and bring this with you to your medical appointments  Please use your weekly pillbox for organizing your medications  Please consider calling the [place] Quitline for their help with quitting smoking.  The [place] Quitline phone number is: [phone]  Feel free to call me with any questions or concerns. I look forward to our next call!   Wallace Cullens, PharmD, [place] [phone]         Our next appointment is by telephone on 06/18/2021 at 1 pm  Please call the care guide team at [phone] if you need to cancel or reschedule your appointment.    The patient verbalized understanding of instructions, educational materials, and care plan provided today and declined offer to receive copy of patient instructions, educational materials, and care plan.

## 2021-04-11 NOTE — Chronic Care Management (AMB) (Signed)
Chronic Care Management CCM Pharmacy Note  04/11/2021 Name:  Audrey Campbell MRN:  [id number] DOB:  [birth year]   Subjective: Audrey Campbell is an 65 y.o. year old female who is a primary patient of [name], DO.  The CCM team was consulted for assistance with disease management and care coordination needs.    Engaged with patient by telephone for follow up visit for pharmacy case management and/or care coordination services.   Objective:  Medications Reviewed Today     Reviewed by Lurlean Nanny, CMA (Certified Medical Assistant) on 02/20/21 at 1457  Med List Status: <None>   Medication Order Taking? Sig Documenting Provider Last Dose Status Informant  albuterol (ACCUNEB) 0.63 MG/3ML nebulizer solution [id number] Yes Take 1 ampule by nebulization every 6 (six) hours as needed for wheezing. [provider] Taking Active   atorvastatin (LIPITOR) 40 MG tablet [id number] Yes Take 1 tablet (40 mg total) by mouth daily. [name], DO Taking Active   BREZTRI AEROSPHERE 160-9-4.8 MCG/ACT AERO [id number] Yes INHALE 2 PUFFS BY MOUTH INTO THE LUNGS IN THE MORNING AND AT BEDTIME Kathrine Haddock, NP Taking Active   butalbital-acetaminophen-caffeine (FIORICET) 50-325-40 MG tablet [id number] Yes Take 1 tablet by mouth every 6 (six) hours as needed for headache. [name], DO Taking Active   diclofenac Sodium (VOLTAREN) 1 % GEL [id number] Yes Apply topically daily as needed. [provider] Taking Active   fluticasone (FLONASE) 50 MCG/ACT nasal spray [id number] Yes Place 2 sprays into both nostrils daily. [name], DO Taking Active   gabapentin (NEURONTIN) 400 MG capsule [id number] Yes TAKE 1 CAPSULE BY MOUTH 4 TIMES DAILY Karamalegos, Devonne Doughty, DO Taking Active   hydrOXYzine (VISTARIL) 25 MG capsule [id number] Yes Take 1 capsule by mouth daily as needed. [provider] Taking Active   levothyroxine  (SYNTHROID) 200 MCG tablet [id number] Yes Take 1 tablet (200 mcg total) by mouth daily before breakfast. [name], DO Taking Active   meloxicam (MOBIC) 15 MG tablet [id number] Yes Take 1 tablet (15 mg total) by mouth daily as needed for pain. [name], DO Taking Active   montelukast (SINGULAIR) 10 MG tablet [id number] Yes Take 1 tablet (10 mg total) by mouth daily. Karamalegos, Devonne Doughty, DO Taking Active   nicotine (NICODERM CQ - DOSED IN MG/24 HOURS) 14 mg/24hr patch [id number] Yes Place 14 mg onto the skin daily. [provider] Taking Active   oxycodone (OXY-IR) 5 MG capsule [id number] Yes Take 5 mg by mouth every 4 (four) hours as needed. 1-2 tablets every 6 hours as needed [provider] Taking Active   polyethylene glycol (MIRALAX / GLYCOLAX) 17 g packet [id number] Yes Take 17 g by mouth daily as needed. [provider] Taking Active   QUEtiapine (SEROQUEL) 100 MG tablet [id number] Yes TAKE 1.5 TABLETS (150 MG TOTAL) BY MOUTHAT BEDTIME Karamalegos, Devonne Doughty, DO Taking Active   VENTOLIN HFA 108 (90 Base) MCG/ACT inhaler [id number] Yes Inhale 1-2 puffs into the lungs every 6 (six) hours as needed for wheezing or shortness of breath. [name], DO Taking Active             Pertinent Labs:   Lab Results  Component Value Date   CHOL 201 (H) 10/16/2020   HDL 47 (L) 10/16/2020   LDLCALC 129 (H) 10/16/2020   TRIG 130 10/16/2020   CHOLHDL 4.3 10/16/2020   Lab Results  Component Value Date   CREATININE 0.93 10/16/2020  BUN 9 10/16/2020   NA 139 10/16/2020   K 4.5 10/16/2020   CL 102 10/16/2020   CO2 30 10/16/2020   Lab Results  Component Value Date   TSH 3.37 10/16/2020    SDOH:  (Social Determinants of Health) assessments and interventions performed:    [place]  Review of patient past medical history, allergies, medications, health status, including review of consultants reports, laboratory and  other test data, was performed as part of comprehensive evaluation and provision of chronic care management services.   Care Plan : PharmD - Medication Assistance  Updates made by Rennis Petty, RPH-CPP since 04/11/2021 12:00 AM     Problem: Disease Progression      Long-Range Goal: Disease Progression Prevented or Minimized   Start Date: 06/23/2020  Expected End Date: 09/21/2020  Recent Progress: On track  Priority: High  Note:   Current Barriers:  Unable to independently afford treatment regimen Reports difficulty with affording copayment for Breztri inhaler through [place] [facility] Patient APPROVED for patient assistance for Breztri through [employer] through 04/28/2021  Pharmacist Clinical Goal(s):  Over the next 90 days, patient will verbalize ability to afford treatment regimen through collaboration with PharmD and provider.   Interventions: 1:1 collaboration with [name], DO regarding development and update of comprehensive plan of care as evidenced by provider attestation and co-signature Inter-disciplinary care team collaboration (see longitudinal plan of care)  Chronic Obstructive Pulmonary Disease: Current treatment: Breztri inhaler - 2 puffs into lungs twice daily Albuterol nebulizer solution/albuterol inhaler as needed Denies having further trouble with her albuterol inhaler; working well Confirms using Breztri inhaler - 2 puffs each morning and 2 puffs every evening as directed.  Remind patient to rinse out mouth after each use  Hypothyroidism: Controlled based on latest lab value; current treatment: levothyroxine 267mcg once daily Today counsel on importance of restarting taking levothyroxine consistently, in the morning on an empty stomach (at least 30 minutes before food)  Tobacco Abuse: Wants to quit, but not ready right now Prefers to discuss again after the holidays Currently smoking: ~10 cigarettes/day Previous therapies  tried: gum, lozenge - did not like the taste Motivation: I want to breathe more than I want to smoke; bad taste of cigarettes Triggers: wanting to hold cigarette; being around others who are smoking Strategies: holding pen in hand; use of hard candy Working with [employer] [phone]), but got off track Enocurage patient to engage with [place] Quitline again  Medication Assistance: Reports has sufficient supply of Breztri to last until end of calendar year Collaborating with [place] for assistance to patient with renewal of Rx to patient assistance program for 2023 calendar year Note patient not required to complete an application for 2956 calendar year, but [facility] will obtain prescriber portion from PCP and submit to company.  From review of chart, note [facility] faxed provider portion to [employer] on 11/22   Hyperlipidemia Uncontrolled; current treatment: atorvastatin 40 mg daily Have counseled patient on rational for/importance of adherence to atorvastatin/LDL lowering   Medication Adherence: Encouraged patient to continue to use weekly pillbox for scheduled medications as adherence aid  Patient Goals/Self-Care Activities Over the next 90 days, patient will:  - take medications as prescribed - focus on medication adherence by using weekly pillbox - collaborate with provider on medication access solutions  Follow Up Plan: Will keep telephone follow up appointment with care management team member as previously scheduled for: 06/18/2021 at 1  pm      Wallace Cullens, PharmD, [place], CPP Clinical Pharmacist [facility] [phone]

## 2021-04-12 DIAGNOSIS — J449 Chronic obstructive pulmonary disease, unspecified: Secondary | ICD-10-CM | POA: Diagnosis not present

## 2021-04-18 ENCOUNTER — Telehealth: Payer: Self-pay | Admitting: Pharmacy Technician

## 2021-04-18 DIAGNOSIS — Z596 Low income: Secondary | ICD-10-CM

## 2021-04-18 DIAGNOSIS — J449 Chronic obstructive pulmonary disease, unspecified: Secondary | ICD-10-CM | POA: Diagnosis not present

## 2021-04-18 NOTE — Progress Notes (Signed)
[facility])                                            [place] Team    04/18/2021  Audrey Campbell June 29, 1955 [id number]   Care coordination call placed to [employer] in regard to [facility] application.  Spoke to [place] who informed patient was APPROVED 04/29/21-04/28/22. She informs medication should automatically be refilled when due in 2023.  Unsucessful outreach call placed to patient. HIPAA compliant voicemail left for patient to return call. Was calling to inform patient of her approval. In basket message sent to embedded PharmD to notify patient at her next CCM OV.  Kaydince Towles P. Ryelee Albee, [place]  [phone]

## 2021-04-19 ENCOUNTER — Other Ambulatory Visit: Payer: Self-pay | Admitting: Family Medicine

## 2021-04-19 DIAGNOSIS — M4722 Other spondylosis with radiculopathy, cervical region: Secondary | ICD-10-CM

## 2021-04-19 DIAGNOSIS — G894 Chronic pain syndrome: Secondary | ICD-10-CM

## 2021-04-19 DIAGNOSIS — M159 Polyosteoarthritis, unspecified: Secondary | ICD-10-CM

## 2021-04-28 DIAGNOSIS — J432 Centrilobular emphysema: Secondary | ICD-10-CM

## 2021-04-28 DIAGNOSIS — E039 Hypothyroidism, unspecified: Secondary | ICD-10-CM

## 2021-05-10 DIAGNOSIS — G894 Chronic pain syndrome: Secondary | ICD-10-CM | POA: Diagnosis not present

## 2021-05-10 DIAGNOSIS — M722 Plantar fascial fibromatosis: Secondary | ICD-10-CM | POA: Diagnosis not present

## 2021-05-10 DIAGNOSIS — M25519 Pain in unspecified shoulder: Secondary | ICD-10-CM | POA: Diagnosis not present

## 2021-05-10 DIAGNOSIS — M545 Low back pain, unspecified: Secondary | ICD-10-CM | POA: Diagnosis not present

## 2021-05-10 DIAGNOSIS — Z79891 Long term (current) use of opiate analgesic: Secondary | ICD-10-CM | POA: Diagnosis not present

## 2021-05-10 DIAGNOSIS — M25569 Pain in unspecified knee: Secondary | ICD-10-CM | POA: Diagnosis not present

## 2021-05-10 DIAGNOSIS — M542 Cervicalgia: Secondary | ICD-10-CM | POA: Diagnosis not present

## 2021-05-19 DIAGNOSIS — J449 Chronic obstructive pulmonary disease, unspecified: Secondary | ICD-10-CM | POA: Diagnosis not present

## 2021-05-25 ENCOUNTER — Ambulatory Visit: Payer: Medicare Other | Admitting: Family Medicine

## 2021-05-25 ENCOUNTER — Ambulatory Visit (INDEPENDENT_AMBULATORY_CARE_PROVIDER_SITE_OTHER): Payer: Medicare Other | Admitting: Family Medicine

## 2021-05-25 ENCOUNTER — Encounter: Payer: Self-pay | Admitting: Family Medicine

## 2021-05-25 ENCOUNTER — Other Ambulatory Visit: Payer: Self-pay

## 2021-05-25 ENCOUNTER — Other Ambulatory Visit: Payer: Self-pay | Admitting: Family Medicine

## 2021-05-25 VITALS — BP 128/68 | HR 72 | Temp 97.5°F | Wt 168.0 lb

## 2021-05-25 DIAGNOSIS — G894 Chronic pain syndrome: Secondary | ICD-10-CM

## 2021-05-25 DIAGNOSIS — F5104 Psychophysiologic insomnia: Secondary | ICD-10-CM

## 2021-05-25 DIAGNOSIS — J302 Other seasonal allergic rhinitis: Secondary | ICD-10-CM

## 2021-05-25 DIAGNOSIS — J432 Centrilobular emphysema: Secondary | ICD-10-CM

## 2021-05-25 DIAGNOSIS — M4722 Other spondylosis with radiculopathy, cervical region: Secondary | ICD-10-CM | POA: Diagnosis not present

## 2021-05-25 DIAGNOSIS — M150 Primary generalized (osteo)arthritis: Secondary | ICD-10-CM

## 2021-05-25 DIAGNOSIS — M159 Polyosteoarthritis, unspecified: Secondary | ICD-10-CM | POA: Diagnosis not present

## 2021-05-25 MED ORDER — PREDNISONE 20 MG PO TABS: ORAL_TABLET | ORAL | 0 refills | Status: DC

## 2021-05-25 MED ORDER — QUETIAPINE FUMARATE 100 MG PO TABS: ORAL_TABLET | ORAL | 1 refills | Status: DC

## 2021-05-25 MED ORDER — ALBUTEROL SULFATE HFA 108 (90 BASE) MCG/ACT IN AERS: 2.0000 | INHALATION_SPRAY | RESPIRATORY_TRACT | 2 refills | Status: DC | PRN

## 2021-05-25 MED ORDER — GABAPENTIN 400 MG PO CAPS: 400.0000 mg | ORAL_CAPSULE | Freq: Four times a day (QID) | ORAL | 3 refills | Status: DC

## 2021-05-25 MED ORDER — LEVOFLOXACIN 500 MG PO TABS: 500.0000 mg | ORAL_TABLET | Freq: Every day | ORAL | 0 refills | Status: DC

## 2021-05-25 MED ORDER — MONTELUKAST SODIUM 10 MG PO TABS: 10.0000 mg | ORAL_TABLET | Freq: Every day | ORAL | 3 refills | Status: DC

## 2021-05-25 NOTE — Patient Instructions (Addendum)
Thank you for coming to the office today.  Refilled all meds  For COPD flare up take Levaquin and Prednisone for 7 days IF WORSENING OR NOT IMPROVED.  Keep on current therapy otherwise  Continue to reduce smoking.  Next time blood work.  Left Ear - fluid behind, clear, no infection Keep on Flonase regularly Future may consider ENT specialist if not improving.  Please schedule a Follow-up Appointment to: Return in about 5 months (around 10/23/2021) for 5 month Annual Physical AM fasting lab AFTER.  If you have any other questions or concerns, please feel free to call the office or send a message through [place]. You may also schedule an earlier appointment if necessary.  Additionally, you may be receiving a survey about your experience at our office within a few days to 1 week by e-mail or mail. We value your feedback.  Nobie Putnam, DO [place]

## 2021-05-25 NOTE — Progress Notes (Signed)
Subjective:    Patient ID: Audrey Campbell, female    DOB: [birth year], 66 y.o.   MRN: [id number]  Audrey Campbell is a 66 y.o. female presenting on 05/25/2021 for COPD and Nicotine Dependence   HPI  COPD Exacerbation URI / Congestion / Cough Reports onset past 2 weeks + with worsening URI symptoms Sinus pressure and pain Today feels better but still worse cough at night. Home COVID test negative x 3 Admits increased productive mucus Admits still smoking, making it worse  COPD Tobacco Abuse On Breztri History of COVID 08/2020 Still smoking 0.5 ppd, has not been able to quit smoking  Recurrent Depression, Chronic / Anxiety / Insomnia Stable problems Continues on Quetiapine nightly Off Clonazepam  Depression screen [facility] 2/9 05/25/2021 02/20/2021 11/21/2020  Decreased Interest 1 0 0  Down, Depressed, Hopeless 0 0 0  PHQ - 2 Score 1 0 0  Altered sleeping 1 - 0  Tired, decreased energy 2 - 0  Change in appetite 0 - 0  Feeling bad or failure about yourself  1 - 0  Trouble concentrating 1 - 0  Moving slowly or fidgety/restless 0 - 0  Suicidal thoughts 0 - 0  PHQ-9 Score 6 - 0  Difficult doing work/chores Somewhat difficult - Not difficult at all    Social History   Tobacco Use   Smoking status: Every Day    Packs/day: 0.50    Types: Cigarettes   Smokeless tobacco: Never  Vaping Use   Vaping Use: Never used  Substance Use Topics   Alcohol use: No   Drug use: No    Review of Systems Per HPI unless specifically indicated above     Objective:    BP 128/68 (BP Location: Left Arm, Patient Position: Sitting, Cuff Size: Normal)    Pulse 72    Temp (!) 97.5 F (36.4 C) (Temporal)    Wt 168 lb (76.2 kg)    SpO2 98%    BMI 28.39 kg/m   Wt Readings from Last 3 Encounters:  05/25/21 168 lb (76.2 kg)  02/20/21 164 lb (74.4 kg)  02/20/21 160 lb (72.6 kg)    Physical Exam Vitals and nursing note reviewed.  Constitutional:      General: She is not in acute  distress.    Appearance: She is well-developed. She is not diaphoretic.     Comments: Well-appearing, comfortable, cooperative  HENT:     Head: Normocephalic and atraumatic.  Eyes:     General:        Right eye: No discharge.        Left eye: No discharge.     Conjunctiva/sclera: Conjunctivae normal.  Neck:     Thyroid: No thyromegaly.  Cardiovascular:     Rate and Rhythm: Normal rate and regular rhythm.     Heart sounds: Normal heart sounds. No murmur heard. Pulmonary:     Effort: Pulmonary effort is normal. No respiratory distress.     Breath sounds: Normal breath sounds. No wheezing, rhonchi or rales.  Musculoskeletal:        General: Normal range of motion.     Cervical back: Normal range of motion and neck supple.  Lymphadenopathy:     Cervical: No cervical adenopathy.  Skin:    General: Skin is warm and dry.     Findings: No erythema or rash.  Neurological:     Mental Status: She is alert and oriented to person, place, and time.  Psychiatric:  Behavior: Behavior normal.     Comments: Well groomed, good eye contact, normal speech and thoughts   Results for orders placed or performed during the hospital encounter of 01/24/21  Surgical pathology  Result Value Ref Range   SURGICAL PATHOLOGY      SURGICAL PATHOLOGY CASE: [id number] PATIENT: [name] Surgical Pathology Report     Specimen Submitted: A. Colon lipoma, cecum; cbx B. Colon lipoma, ascending; cbx  Clinical History: Positive colorectal cancer screening R19.5.  Lipoma x2, diverticulosis      DIAGNOSIS: A. COLON LIPOMA, CECUM; COLD BIOPSY: - COLONIC MUCOSA WITH SUBMUCOSAL ADIPOSE TISSUE, COMPATIBLE WITH LIPOMA IN THE PROPER CLINICAL CONTEXT. - NEGATIVE FOR DYSPLASIA AND MALIGNANCY.  B. COLON LIPOMA, ASCENDING; COLD BIOPSY: - COLONIC MUCOSA WITH SUBMUCOSAL ADIPOSE TISSUE, COMPATIBLE WITH LIPOMA IN THE PROPER CLINICAL CONTEXT. - NEGATIVE FOR DYSPLASIA AND MALIGNANCY.   GROSS  DESCRIPTION: A. Labeled: Cecum lipoma cbxs Received: Formalin Collection time: 8:41 AM on 01/24/2021 Placed into formalin time: 8:41 AM on 01/24/2021 Tissue fragment(s): 1 Size: 0.3 x 0.3 x 0.1 cm Description: Yellow soft tissue fragment Entirely submitted in 1 cassette.  B. Labeled: Ascending colon li poma cbxs Received: Formalin Collection time: 8:47 AM on 01/24/2021 Placed into formalin time: 8:47 AM on 01/24/2021 Tissue fragment(s): 2 Size: Ranges from 0.4-0.5 cm Description: Yellow soft tissue fragments Entirely submitted in 1 cassette.  [facility] 01/24/2021  Final Diagnosis performed by Betsy Pries, MD.   Electronically signed 01/25/2021 9:11:55AM The electronic signature indicates that the named Attending Pathologist has evaluated the specimen Technical component performed at [facility], [address] [place], [place] [address] Lab: [phone] Dir: Rush Farmer, MD, MMM  Professional component performed at [facility], [facility], [place], [place], [place] [address] Lab: [phone] Dir: Dellia Nims. Rubinas, MD       Assessment & Plan:   Problem List Items Addressed This Visit     Psychophysiological insomnia   Relevant Medications   QUEtiapine (SEROQUEL) 100 MG tablet   Primary osteoarthritis involving multiple joints   Relevant Medications   gabapentin (NEURONTIN) 400 MG capsule   predniSONE (DELTASONE) 20 MG tablet   Osteoarthritis of spine with radiculopathy, cervical region   Relevant Medications   gabapentin (NEURONTIN) 400 MG capsule   QUEtiapine (SEROQUEL) 100 MG tablet   predniSONE (DELTASONE) 20 MG tablet   Chronic pain syndrome   Relevant Medications   gabapentin (NEURONTIN) 400 MG capsule   predniSONE (DELTASONE) 20 MG tablet   Centrilobular emphysema (HCC) - Primary   Relevant Medications   montelukast (SINGULAIR) 10 MG tablet   albuterol (VENTOLIN HFA) 108 (90 Base) MCG/ACT inhaler   levofloxacin (LEVAQUIN) 500 MG tablet    predniSONE (DELTASONE) 20 MG tablet   Other Visit Diagnoses     Seasonal allergies       Relevant Medications   montelukast (SINGULAIR) 10 MG tablet       Refilled all meds  For COPD flare up take Levaquin and Prednisone for 7 days IF WORSENING OR NOT IMPROVED.  Keep on current therapy otherwise  Continue to reduce smoking.  Next time blood work.  Left Ear - fluid behind, clear, no infection Keep on Flonase regularly Future may consider ENT specialist if not improving.    Meds ordered this encounter  Medications   gabapentin (NEURONTIN) 400 MG capsule    Sig: Take 1 capsule (400 mg total) by mouth 4 (four) times daily.    Dispense:  360 capsule    Refill:  3  Add refills   montelukast (SINGULAIR) 10 MG tablet    Sig: Take 1 tablet (10 mg total) by mouth daily.    Dispense:  90 tablet    Refill:  3    Quantity should be 90 days, if you can update with this new rx. Thank you!   albuterol (VENTOLIN HFA) 108 (90 Base) MCG/ACT inhaler    Sig: Inhale 2 puffs into the lungs every 4 (four) hours as needed for wheezing or shortness of breath.    Dispense:  8.5 g    Refill:  2   QUEtiapine (SEROQUEL) 100 MG tablet    Sig: TAKE 1.5 TABLETS (150 MG TOTAL) BY MOUTHAT BEDTIME    Dispense:  135 tablet    Refill:  1   levofloxacin (LEVAQUIN) 500 MG tablet    Sig: Take 1 tablet (500 mg total) by mouth daily. For 7 days    Dispense:  7 tablet    Refill:  0   predniSONE (DELTASONE) 20 MG tablet    Sig: Take daily with food. Start with 60mg  (3 pills) x 2 days, then reduce to 40mg  (2 pills) x 2 days, then 20mg  (1 pill) x 3 days    Dispense:  13 tablet    Refill:  0      Follow up plan: Return in about 5 months (around 10/23/2021) for 5 month Annual Physical AM fasting lab AFTER.   Nobie Putnam, [facility] 05/25/2021, 2:48 PM

## 2021-05-25 NOTE — Telephone Encounter (Signed)
Requested Prescriptions  Pending Prescriptions Disp Refills   albuterol (VENTOLIN HFA) 108 (90 Base) MCG/ACT inhaler [Pharmacy Med Name: ALBUTEROL SULFATE HFA 108 (90 BASE)] 8.5 g 1    Sig: INHALE 1 TO 2 PUFFS BY MOUTH INTO THE LUNGS EVERY 6 HOURS AS NEEDED FOR WHEEZING OR SHORTNESS OF BREATH.     Pulmonology:  Beta Agonists Failed - 05/25/2021 10:25 AM      Failed - One inhaler should last at least one month. If the patient is requesting refills earlier, contact the patient to check for uncontrolled symptoms.      Passed - Valid encounter within last 12 months    Recent Outpatient Visits          6 months ago Centrilobular emphysema ([place])   [place], DO   7 months ago Annual physical exam   [place], DO   8 months ago COVID-19 virus infection   [place], DO   1 year ago Centrilobular emphysema [facility])   [facility] [name], DO   1 year ago Centrilobular emphysema [facility])   [facility], Devonne Doughty, DO      Future Appointments            Today [facility] Devonne Doughty, [facility], [place]   In 9 months  [facility], [place]

## 2021-06-18 ENCOUNTER — Ambulatory Visit (INDEPENDENT_AMBULATORY_CARE_PROVIDER_SITE_OTHER): Payer: Medicare Other | Admitting: Pharmacist

## 2021-06-18 DIAGNOSIS — F17200 Nicotine dependence, unspecified, uncomplicated: Secondary | ICD-10-CM

## 2021-06-18 DIAGNOSIS — J432 Centrilobular emphysema: Secondary | ICD-10-CM

## 2021-06-18 DIAGNOSIS — E039 Hypothyroidism, unspecified: Secondary | ICD-10-CM

## 2021-06-18 NOTE — Chronic Care Management (AMB) (Signed)
Chronic Care Management CCM Pharmacy Note  06/18/2021 Name:  Audrey Campbell MRN:  [id number] DOB:  10-04-1955   Subjective: Audrey Campbell is an 66 y.o. year old female who is a primary patient of [name], DO.  The CCM team was consulted for assistance with disease management and care coordination needs.    Engaged with patient by telephone for follow up visit for pharmacy case management and/or care coordination services.   Objective:  Medications Reviewed Today     Reviewed by Rennis Petty, RPH-CPP (Pharmacist) on 06/18/21 at 1323  Med List Status: <None>   Medication Order Taking? Sig Documenting Provider Last Dose Status Informant  albuterol (ACCUNEB) 0.63 MG/3ML nebulizer solution [id number] Yes Take 1 ampule by nebulization every 6 (six) hours as needed for wheezing. [provider] Taking Active   albuterol (VENTOLIN HFA) 108 (90 Base) MCG/ACT inhaler [id number] Yes Inhale 2 puffs into the lungs every 4 (four) hours as needed for wheezing or shortness of breath. [name], DO Taking Active   atorvastatin (LIPITOR) 40 MG tablet [id number] Yes Take 1 tablet (40 mg total) by mouth daily. [name], DO Taking Active   BREZTRI AEROSPHERE 160-9-4.8 MCG/ACT AERO [id number] Yes INHALE 2 PUFFS BY MOUTH INTO THE LUNGS IN THE MORNING AND AT BEDTIME Kathrine Haddock, NP Taking Active   butalbital-acetaminophen-caffeine (FIORICET) 50-325-40 MG tablet [id number] No Take 1 tablet by mouth every 6 (six) hours as needed for headache.  Patient not taking: Reported on 06/18/2021   [name], DO Not Taking Active   diclofenac Sodium (VOLTAREN) 1 % GEL [id number] Yes Apply topically daily as needed. [provider] Taking Active   fluticasone (FLONASE) 50 MCG/ACT nasal spray [id number] Yes Place 2 sprays into both nostrils daily. [name], DO Taking Active   gabapentin (NEURONTIN) 400 MG capsule  [id number] Yes Take 1 capsule (400 mg total) by mouth 4 (four) times daily. [name], DO Taking Active   hydrOXYzine (VISTARIL) 25 MG capsule [id number] Yes Take 1 capsule by mouth daily as needed. [provider] Taking Active   levothyroxine (SYNTHROID) 200 MCG tablet [id number]  Take 1 tablet (200 mcg total) by mouth daily before breakfast. [name], DO  Active   meloxicam (MOBIC) 15 MG tablet [id number] Yes Take 1 tablet (15 mg total) by mouth daily as needed for pain. [name], DO Taking Active   montelukast (SINGULAIR) 10 MG tablet [id number] Yes Take 1 tablet (10 mg total) by mouth daily. Karamalegos, Devonne Doughty, DO Taking Active   nicotine (NICODERM CQ - DOSED IN MG/24 HOURS) 14 mg/24hr patch [id number] Yes Place 14 mg onto the skin daily. [provider] Taking Active   oxycodone (OXY-IR) 5 MG capsule [id number] Yes Take 5 mg by mouth every 4 (four) hours as needed. 1-2 tablets every 6 hours as needed [provider] Taking Active   polyethylene glycol (MIRALAX / GLYCOLAX) 17 g packet [id number] Yes Take 17 g by mouth daily as needed. [provider] Taking Active   QUEtiapine (SEROQUEL) 100 MG tablet [id number] Yes TAKE 1.5 TABLETS (150 MG TOTAL) BY MOUTHAT BEDTIME [name], DO Taking Active             Pertinent Labs:   Lab Results  Component Value Date   CHOL 201 (H) 10/16/2020   HDL 47 (L) 10/16/2020   LDLCALC 129 (H) 10/16/2020   TRIG 130 10/16/2020   CHOLHDL 4.3 10/16/2020  Lab Results  Component Value Date   CREATININE 0.93 10/16/2020   BUN 9 10/16/2020   NA 139 10/16/2020   K 4.5 10/16/2020   CL 102 10/16/2020   CO2 30 10/16/2020   Lab Results  Component Value Date   TSH 3.37 10/16/2020    SDOH:  (Social Determinants of Health) assessments and interventions performed:    [place]  Review of patient past medical history, allergies, medications, health status,  including review of consultants reports, laboratory and other test data, was performed as part of comprehensive evaluation and provision of chronic care management services.   Care Plan : PharmD - Medication Assistance  Updates made by Rennis Petty, RPH-CPP since 06/18/2021 12:00 AM     Problem: Disease Progression      Long-Range Goal: Disease Progression Prevented or Minimized   Start Date: 06/23/2020  Expected End Date: 09/21/2020  This Visit's Progress: On track  Recent Progress: On track  Priority: High  Note:   Current Barriers:  Unable to independently afford treatment regimen Reports difficulty with affording copayment for Breztri inhaler through [place] 1 Patient APPROVED for patient assistance for Breztri through [employer] through 04/28/2022  Pharmacist Clinical Goal(s):  Over the next 90 days, patient will verbalize ability to afford treatment regimen through collaboration with PharmD and provider.   Interventions: 1:1 collaboration with [name], DO regarding development and update of comprehensive plan of care as evidenced by provider attestation and co-signature Inter-disciplinary care team collaboration (see longitudinal plan of care) Perform chart review. Patient seen for Office Visit with PCP on 05/25/2021. Provider advised: For COPD flare up take Levaquin and Prednisone for 7 days IF WORSENING OR NOT IMPROVED Today patient reports she did start and complete courses of both Levaquin and prednisone as prescribed by PCP and symptoms significantly improved Comprehensive medication review performed; medication list updated in electronic medical record Counsel patient on increased risk of dizziness/sedation with CNS depressant medications including oxycodone, hydroxyzine and Fioricet Patient verbalizes understanding. Reports uses oxycodone, hydroxyzine and Fioricet each as needed Confirms uses caution to avoid risk of falls Note  oxycodone managed for her by [facility] for plantar fasciitis  Medication Assistance: Collaborated with Marietta Simcox for assistance to patient with renewal of Rx to patient assistance program for 2023 calendar year Received a message from [place] Simcox advising patient [place] 04/29/21-04/28/22 for re-enrollment in [place] patient assistance from [employer].  Today patient reports received 3 new inhalers from program last month  Chronic Obstructive Pulmonary Disease: Current treatment: Breztri inhaler - 2 puffs into lungs twice daily Albuterol nebulizer solution/albuterol inhaler as needed Confirms using Breztri inhaler - 2 puffs each morning and 2 puffs every evening as directed.  Remind patient to rinse out mouth after each use. Confirms uses albuterol as needed as directed  Hypothyroidism: Controlled based on latest lab value; current treatment: levothyroxine 28mcg once daily Today reports feels like taking levothyroxine first thing in the morning is upsetting her stomach. States did not take it today Counsel patient on importance of taking consistently and on an empty stomach - Advise as alternative to taking first thing in morning, may consistently administer at night 3 to 4 hours after the last meal/food Patient states will start taking levothyroxine this way  Tobacco Abuse: Congratulate patient as she reports has quit smoking for 4 days Reports has noticed that she is coughing less since stopped Encourage patient to continue to abstain from  smoking Current treatment: nicotine 14 mg/day patch Previous therapies tried: gum, lozenge - did not like the taste Motivation: I want to breathe more than I want to smoke; bad taste of cigarettes Triggers: wanting to hold cigarette; being around others who are smoking Encourage patient to consider re-trying nicotine gum or lozenge if needed to help control cravings Counsel patient on administration Encourage patient to  consider working with [place] [facility] [phone]) again   Hyperlipidemia Uncontrolled; current treatment: atorvastatin 40 mg daily Have counseled patient on rational for/importance of adherence to atorvastatin/LDL lowering   Medication Adherence: Have encouraged patient to continue to use weekly pillbox for scheduled medications as adherence aid  Patient Goals/Self-Care Activities Over the next 90 days, patient will:  - take medications as prescribed - focus on medication adherence by using weekly pillbox - collaborate with provider on medication access solutions  Follow Up Plan: Will keep telephone follow up appointment with care management team member as previously scheduled for: 08/20/2021 at 1:00 PM      Wallace Cullens, PharmD, Para [place], [place] [phone]

## 2021-06-18 NOTE — Patient Instructions (Signed)
Visit Information  Thank you for taking time to visit with me today. Please don't hesitate to contact me if I can be of assistance to you before our next scheduled telephone appointment.  Following are the goals we discussed today:   Goals Addressed             This Visit's Progress    Pharmacy Goals       Please check your home blood pressure, keep a log of the results and bring this with you to your medical appointments  Please use your weekly pillbox for organizing your medications  Please consider calling the [place] [facility] again.  The [place] [facility] phone number is: [phone]  Feel free to call me with any questions or concerns. I look forward to our next call!  Wallace Cullens, PharmD, [place] [phone]         Our next appointment is by telephone on 08/20/2021 at 1:00 PM  Please call the care guide team at [phone] if you need to cancel or reschedule your appointment.    The patient verbalized understanding of instructions, educational materials, and care plan provided today and declined offer to receive copy of patient instructions, educational materials, and care plan.

## 2021-06-19 DIAGNOSIS — J449 Chronic obstructive pulmonary disease, unspecified: Secondary | ICD-10-CM | POA: Diagnosis not present

## 2021-06-25 DIAGNOSIS — J449 Chronic obstructive pulmonary disease, unspecified: Secondary | ICD-10-CM | POA: Diagnosis not present

## 2021-06-26 DIAGNOSIS — J432 Centrilobular emphysema: Secondary | ICD-10-CM | POA: Diagnosis not present

## 2021-06-26 DIAGNOSIS — E039 Hypothyroidism, unspecified: Secondary | ICD-10-CM | POA: Diagnosis not present

## 2021-07-17 DIAGNOSIS — J449 Chronic obstructive pulmonary disease, unspecified: Secondary | ICD-10-CM | POA: Diagnosis not present

## 2021-07-20 DIAGNOSIS — J449 Chronic obstructive pulmonary disease, unspecified: Secondary | ICD-10-CM | POA: Diagnosis not present

## 2021-07-24 DIAGNOSIS — H35363 Drusen (degenerative) of macula, bilateral: Secondary | ICD-10-CM | POA: Diagnosis not present

## 2021-07-24 DIAGNOSIS — H524 Presbyopia: Secondary | ICD-10-CM | POA: Diagnosis not present

## 2021-07-24 DIAGNOSIS — H10233 Serous conjunctivitis, except viral, bilateral: Secondary | ICD-10-CM | POA: Diagnosis not present

## 2021-07-24 DIAGNOSIS — H04123 Dry eye syndrome of bilateral lacrimal glands: Secondary | ICD-10-CM | POA: Diagnosis not present

## 2021-07-24 DIAGNOSIS — H5203 Hypermetropia, bilateral: Secondary | ICD-10-CM | POA: Diagnosis not present

## 2021-07-24 DIAGNOSIS — H52223 Regular astigmatism, bilateral: Secondary | ICD-10-CM | POA: Diagnosis not present

## 2021-07-24 DIAGNOSIS — H2513 Age-related nuclear cataract, bilateral: Secondary | ICD-10-CM | POA: Diagnosis not present

## 2021-08-07 DIAGNOSIS — H04123 Dry eye syndrome of bilateral lacrimal glands: Secondary | ICD-10-CM | POA: Diagnosis not present

## 2021-08-07 DIAGNOSIS — H10233 Serous conjunctivitis, except viral, bilateral: Secondary | ICD-10-CM | POA: Diagnosis not present

## 2021-08-07 DIAGNOSIS — H2513 Age-related nuclear cataract, bilateral: Secondary | ICD-10-CM | POA: Diagnosis not present

## 2021-08-07 DIAGNOSIS — H35363 Drusen (degenerative) of macula, bilateral: Secondary | ICD-10-CM | POA: Diagnosis not present

## 2021-08-07 DIAGNOSIS — H524 Presbyopia: Secondary | ICD-10-CM | POA: Diagnosis not present

## 2021-08-07 DIAGNOSIS — H5203 Hypermetropia, bilateral: Secondary | ICD-10-CM | POA: Diagnosis not present

## 2021-08-07 DIAGNOSIS — H52223 Regular astigmatism, bilateral: Secondary | ICD-10-CM | POA: Diagnosis not present

## 2021-08-10 ENCOUNTER — Ambulatory Visit: Payer: Medicare Other | Admitting: Family Medicine

## 2021-08-16 ENCOUNTER — Other Ambulatory Visit: Payer: Self-pay | Admitting: Family Medicine

## 2021-08-16 DIAGNOSIS — M25519 Pain in unspecified shoulder: Secondary | ICD-10-CM | POA: Diagnosis not present

## 2021-08-16 DIAGNOSIS — M545 Low back pain, unspecified: Secondary | ICD-10-CM | POA: Diagnosis not present

## 2021-08-16 DIAGNOSIS — M722 Plantar fascial fibromatosis: Secondary | ICD-10-CM | POA: Diagnosis not present

## 2021-08-16 DIAGNOSIS — Z79891 Long term (current) use of opiate analgesic: Secondary | ICD-10-CM | POA: Diagnosis not present

## 2021-08-16 DIAGNOSIS — G8929 Other chronic pain: Secondary | ICD-10-CM

## 2021-08-16 DIAGNOSIS — M542 Cervicalgia: Secondary | ICD-10-CM | POA: Diagnosis not present

## 2021-08-16 DIAGNOSIS — G894 Chronic pain syndrome: Secondary | ICD-10-CM | POA: Diagnosis not present

## 2021-08-16 DIAGNOSIS — M25569 Pain in unspecified knee: Secondary | ICD-10-CM | POA: Diagnosis not present

## 2021-08-16 NOTE — Telephone Encounter (Signed)
Medication Refill - Medication: butalbital-acetaminophen-caffeine (FIORICET) 50-325-40 MG tablet ? ?Has the patient contacted their pharmacy? Yes.   ?[facility] is calling to ask for this medication. ?It is now considered a controled substance. ? ?Preferred Pharmacy (with phone number or street name): [place], [place] ?Has the patient been seen for an appointment in the last year OR does the patient have an upcoming appointment? Yes.   ? ?Agent: Please be advised that RX refills may take up to 3 business days. We ask that you follow-up with your pharmacy.  ?

## 2021-08-17 DIAGNOSIS — J449 Chronic obstructive pulmonary disease, unspecified: Secondary | ICD-10-CM | POA: Diagnosis not present

## 2021-08-17 MED ORDER — BUTALBITAL-APAP-CAFFEINE 50-325-40 MG PO TABS: 1.0000 | ORAL_TABLET | Freq: Four times a day (QID) | ORAL | 0 refills | Status: DC | PRN

## 2021-08-17 NOTE — Telephone Encounter (Signed)
Requested medication (s) are due for refill today: yes ? ?Requested medication (s) are on the active medication list: yes ? ?Last refill:  12/06/20 #30 with 3 ? ?Future visit scheduled: 09/25/21 ? ?Notes to clinic:  This medication can not be delegated, please assess.  ? ? ? ?  ? ?Requested Prescriptions  ?Pending Prescriptions Disp Refills  ? butalbital-acetaminophen-caffeine (FIORICET) 50-325-40 MG tablet 30 tablet 3  ?  Sig: Take 1 tablet by mouth every 6 (six) hours as needed for headache.  ?  ? Not Delegated - Analgesics:  Non-Opioid Analgesic Combinations 2 Failed - 08/16/2021  3:21 PM  ?  ?  Failed - This refill cannot be delegated  ?  ?  Passed - Cr in normal range and within 360 days  ?  Creat  ?Date Value Ref Range Status  ?10/16/2020 0.93 0.50 - 0.99 mg/dL Final  ?  Comment:  ?  For patients >51 years of age, the reference limit ?for Creatinine is approximately 13% higher for people ?identified as African-American. ?. ?  ?  ?  ?  ?  Passed - eGFR is 10 or above and within 360 days  ?  GFR, Est African American  ?Date Value Ref Range Status  ?10/16/2020 75 > OR = 60 mL/min/1.43m Final  ? ?GFR, Est Non African American  ?Date Value Ref Range Status  ?10/16/2020 64 > OR = 60 mL/min/1.764mFinal  ?  ?  ?  ?  Passed - Patient is not pregnant  ?  ?  Passed - Valid encounter within last 12 months  ?  Recent Outpatient Visits   ? ?      ? 2 months ago [facility] ? [place]  ? [facility] ? [place]  ? 9 months ago Annual physical exam  ? [place]  ? 11 months ago COVID-19 virus infection  ? [place]  ? 1 year [facility] ? [place]  ? ?  ?  ?Future Appointments   ? ?        ? In 1 month Karamalegos,  AlDevonne DoughtyDO [facility] [place]? In 6 months  [facility]? ?  ? ? ?  ?  ?  ? ? ?

## 2021-08-20 ENCOUNTER — Telehealth: Payer: Self-pay | Admitting: *Deleted

## 2021-08-20 ENCOUNTER — Telehealth: Payer: Medicare Other

## 2021-08-20 NOTE — Chronic Care Management (AMB) (Signed)
?  Care Management  ? ?Note ? ?08/20/2021 ?Name: Audrey Campbell MRN: [id number] DOB: 09/01/1955 ? ?Audrey Campbell is a 66 y.o. year old female who is a primary care patient of [name], DO and is actively engaged with the care management team. I reached out to Maryellen Pile by phone today to assist with re-scheduling a follow up visit with the Pharmacist ? ?Follow up plan: ?Unsuccessful telephone outreach attempt made. A HIPAA compliant phone message was left for the patient providing contact information and requesting a return call.  ? ?Lavoy Bernards, CCMA ?Care Guide, Embedded Care Coordination ?[place]  Care Management  ?Direct Dial: [phone] ? ? ?

## 2021-08-22 DIAGNOSIS — J449 Chronic obstructive pulmonary disease, unspecified: Secondary | ICD-10-CM | POA: Diagnosis not present

## 2021-08-29 NOTE — Chronic Care Management (AMB) (Signed)
?  Chronic Care Management ?Note ? ?08/29/2021 ?Name: TRINATY BUNDRICK MRN: [id number] DOB: 08-13-1955 ? ?MINNA DUMIRE is a 66 y.o. year old female who is a primary care patient of [name], DO and is actively engaged with the care management team. I reached out to Maryellen Pile by phone today to assist with re-scheduling a follow up visit with the Pharmacist ? ?Follow up plan: ?Patient declines further follow up and engagement by the care management team. Appropriate care team members and provider have been notified via electronic communication.  ? ?Taejon Irani, CCMA ?Care Guide, Embedded Care Coordination ?[place]  Care Management  ?Direct Dial: [phone] ? ? ?

## 2021-09-05 DIAGNOSIS — H524 Presbyopia: Secondary | ICD-10-CM | POA: Diagnosis not present

## 2021-09-05 DIAGNOSIS — H2513 Age-related nuclear cataract, bilateral: Secondary | ICD-10-CM | POA: Diagnosis not present

## 2021-09-05 DIAGNOSIS — H5203 Hypermetropia, bilateral: Secondary | ICD-10-CM | POA: Diagnosis not present

## 2021-09-05 DIAGNOSIS — H52223 Regular astigmatism, bilateral: Secondary | ICD-10-CM | POA: Diagnosis not present

## 2021-09-05 DIAGNOSIS — H35363 Drusen (degenerative) of macula, bilateral: Secondary | ICD-10-CM | POA: Diagnosis not present

## 2021-09-05 DIAGNOSIS — H10233 Serous conjunctivitis, except viral, bilateral: Secondary | ICD-10-CM | POA: Diagnosis not present

## 2021-09-05 DIAGNOSIS — H02885 Meibomian gland dysfunction left lower eyelid: Secondary | ICD-10-CM | POA: Diagnosis not present

## 2021-09-05 DIAGNOSIS — H04123 Dry eye syndrome of bilateral lacrimal glands: Secondary | ICD-10-CM | POA: Diagnosis not present

## 2021-09-05 DIAGNOSIS — H02882 Meibomian gland dysfunction right lower eyelid: Secondary | ICD-10-CM | POA: Diagnosis not present

## 2021-09-13 DIAGNOSIS — H5203 Hypermetropia, bilateral: Secondary | ICD-10-CM | POA: Diagnosis not present

## 2021-09-13 DIAGNOSIS — H2513 Age-related nuclear cataract, bilateral: Secondary | ICD-10-CM | POA: Diagnosis not present

## 2021-09-13 DIAGNOSIS — H10233 Serous conjunctivitis, except viral, bilateral: Secondary | ICD-10-CM | POA: Diagnosis not present

## 2021-09-13 DIAGNOSIS — H524 Presbyopia: Secondary | ICD-10-CM | POA: Diagnosis not present

## 2021-09-13 DIAGNOSIS — H35363 Drusen (degenerative) of macula, bilateral: Secondary | ICD-10-CM | POA: Diagnosis not present

## 2021-09-13 DIAGNOSIS — H04123 Dry eye syndrome of bilateral lacrimal glands: Secondary | ICD-10-CM | POA: Diagnosis not present

## 2021-09-13 DIAGNOSIS — H52223 Regular astigmatism, bilateral: Secondary | ICD-10-CM | POA: Diagnosis not present

## 2021-09-13 DIAGNOSIS — H02882 Meibomian gland dysfunction right lower eyelid: Secondary | ICD-10-CM | POA: Diagnosis not present

## 2021-09-13 DIAGNOSIS — H02885 Meibomian gland dysfunction left lower eyelid: Secondary | ICD-10-CM | POA: Diagnosis not present

## 2021-09-16 DIAGNOSIS — J449 Chronic obstructive pulmonary disease, unspecified: Secondary | ICD-10-CM | POA: Diagnosis not present

## 2021-09-19 ENCOUNTER — Other Ambulatory Visit: Payer: Self-pay | Admitting: Family Medicine

## 2021-09-19 DIAGNOSIS — J432 Centrilobular emphysema: Secondary | ICD-10-CM

## 2021-09-20 ENCOUNTER — Other Ambulatory Visit: Payer: Self-pay | Admitting: Family Medicine

## 2021-09-20 ENCOUNTER — Ambulatory Visit: Payer: Medicare Other | Admitting: Family Medicine

## 2021-09-20 DIAGNOSIS — J449 Chronic obstructive pulmonary disease, unspecified: Secondary | ICD-10-CM | POA: Diagnosis not present

## 2021-09-20 DIAGNOSIS — J432 Centrilobular emphysema: Secondary | ICD-10-CM

## 2021-09-20 NOTE — Telephone Encounter (Signed)
Last doc. Refill 08/20/21. Requested Prescriptions  Refused Prescriptions Disp Refills  . albuterol (VENTOLIN HFA) 108 (90 Base) MCG/ACT inhaler [Pharmacy Med Name: ALBUTEROL SULFATE HFA 108 (90 BASE)] 8.5 g 2    Sig: INHALE 2 PUFFS INTO THE LUNGS EVERY 4 HOURS AS NEEDED FOR WHEEZING OR SHORTNESS OF BREATH     Pulmonology:  Beta Agonists 2 Passed - 09/19/2021 10:49 AM      Passed - Last BP in normal range    BP Readings from Last 1 Encounters:  05/25/21 128/68         Passed - Last Heart Rate in normal range    Pulse Readings from Last 1 Encounters:  05/25/21 72         Passed - Valid encounter within last 12 months    Recent Outpatient Visits          3 months ago Centrilobular emphysema ([place])   [facility] [name], DO   10 months ago Centrilobular emphysema [facility])   [facility] [name], DO   11 months ago Annual physical exam   [facility] [name], DO   1 year ago COVID-19 virus infection   [place], DO   1 year ago Centrilobular emphysema [facility])   [facility], Devonne Doughty, DO      Future Appointments            Tomorrow [facility], Devonne Doughty, [facility], [place]   In 5 days [facility], [place]   In 5 months  [facility], [facility]

## 2021-09-21 ENCOUNTER — Ambulatory Visit (INDEPENDENT_AMBULATORY_CARE_PROVIDER_SITE_OTHER): Payer: Medicare Other | Admitting: Family Medicine

## 2021-09-21 ENCOUNTER — Encounter: Payer: Self-pay | Admitting: Family Medicine

## 2021-09-21 VITALS — BP 126/72 | HR 80 | Ht 64.5 in | Wt 174.0 lb

## 2021-09-21 DIAGNOSIS — H0100B Unspecified blepharitis left eye, upper and lower eyelids: Secondary | ICD-10-CM

## 2021-09-21 DIAGNOSIS — H0100A Unspecified blepharitis right eye, upper and lower eyelids: Secondary | ICD-10-CM | POA: Diagnosis not present

## 2021-09-21 MED ORDER — DOXYCYCLINE HYCLATE 100 MG PO TABS: 100.0000 mg | ORAL_TABLET | Freq: Two times a day (BID) | ORAL | 0 refills | Status: DC

## 2021-09-21 NOTE — Patient Instructions (Addendum)
Thank you for coming to the office today.  Eyes improving  Did the refills  If not improving still redness around eyes may use antibiotic  Start taking Doxycycline antibiotic '100mg'$  twice daily for 10 days. Take with full glass of water and stay upright for at least 30 min after taking, may be seated or standing, but should NOT lay down. This is just a safety precaution, if this medicine does not go all the way down throat well it could cause some burning discomfort to throat and esophagus.   Please schedule a Follow-up Appointment to: Return if symptoms worsen or fail to improve.  If you have any other questions or concerns, please feel free to call the office or send a message through [place]. You may also schedule an earlier appointment if necessary.  Additionally, you may be receiving a survey about your experience at our office within a few days to 1 week by e-mail or mail. We value your feedback.  Nobie Putnam, DO [place]

## 2021-09-21 NOTE — Progress Notes (Signed)
Subjective:    Patient ID: Audrey Campbell, female    DOB: December 01, 1955, 66 y.o.   MRN: [id number]  Audrey Campbell is a 66 y.o. female presenting on 09/21/2021 for Eye Problem and COPD   HPI  Blepharitis Right Eye Swelling / Redness She had few weeks of bilateral eye problem with itching, burning, running drainage She saw [facility] Dr Jomarie Longs for this problem a few times, given topical antibiotic drops and then switched to antibiotic ointment for eyes and also an eye wash. Difficulty with improvement mostly had redness around eyelid and swelling localized. Recent improvement 2-3 days now since stopped the topical eye drops. Denies fever chills nausea vomiting loss of vision eye pain      09/21/2021    8:45 AM 05/25/2021    2:36 PM 02/20/2021   11:02 AM  Depression screen PHQ 2/9  Decreased Interest 1 1 0  Down, Depressed, Hopeless 0 0 0  PHQ - 2 Score 1 1 0  Altered sleeping 1 1   Tired, decreased energy 2 2   Change in appetite 0 0   Feeling bad or failure about yourself  1 1   Trouble concentrating 1 1   Moving slowly or fidgety/restless 0 0   Suicidal thoughts 0 0   PHQ-9 Score 6 6   Difficult doing work/chores Not difficult at all Somewhat difficult     Social History   Tobacco Use   Smoking status: Every Day    Packs/day: 0.50    Types: Cigarettes   Smokeless tobacco: Never  Vaping Use   Vaping Use: Never used  Substance Use Topics   Alcohol use: No   Drug use: No    Review of Systems Per HPI unless specifically indicated above     Objective:    BP 126/72   Pulse 80   Ht 5' 4.5" (1.638 m)   Wt 174 lb (78.9 kg)   SpO2 98%   BMI 29.41 kg/m   Wt Readings from Last 3 Encounters:  09/21/21 174 lb (78.9 kg)  05/25/21 168 lb (76.2 kg)  02/20/21 164 lb (74.4 kg)    Physical Exam Vitals and nursing note reviewed.  Constitutional:      General: She is not in acute distress.    Appearance: Normal appearance. She is well-developed. She is not  diaphoretic.     Comments: Well-appearing, comfortable, cooperative  HENT:     Head: Normocephalic and atraumatic.  Eyes:     General:        Right eye: No discharge.        Left eye: No discharge.     Extraocular Movements: Extraocular movements intact.     Conjunctiva/sclera: Conjunctivae normal.     Pupils: Pupils are equal, round, and reactive to light.     Comments: Eyelids both sides very residual minimal erythema, no edema today much improved  Cardiovascular:     Rate and Rhythm: Normal rate.  Pulmonary:     Effort: Pulmonary effort is normal.  Skin:    General: Skin is warm and dry.     Findings: No erythema or rash.  Neurological:     Mental Status: She is alert and oriented to person, place, and time.  Psychiatric:        Mood and Affect: Mood normal.        Behavior: Behavior normal.        Thought Content: Thought content normal.  Comments: Well groomed, good eye contact, normal speech and thoughts   Results for orders placed or performed during the hospital encounter of 01/24/21  Surgical pathology  Result Value Ref Range   SURGICAL PATHOLOGY      SURGICAL PATHOLOGY CASE: [id number] PATIENT: Audrey Campbell Surgical Pathology Report     Specimen Submitted: A. Colon lipoma, cecum; cbx B. Colon lipoma, ascending; cbx  Clinical History: Positive colorectal cancer screening R19.5.  Lipoma x2, diverticulosis      DIAGNOSIS: A. COLON LIPOMA, CECUM; COLD BIOPSY: - COLONIC MUCOSA WITH SUBMUCOSAL ADIPOSE TISSUE, COMPATIBLE WITH LIPOMA IN THE PROPER CLINICAL CONTEXT. - NEGATIVE FOR DYSPLASIA AND MALIGNANCY.  B. COLON LIPOMA, ASCENDING; COLD BIOPSY: - COLONIC MUCOSA WITH SUBMUCOSAL ADIPOSE TISSUE, COMPATIBLE WITH LIPOMA IN THE PROPER CLINICAL CONTEXT. - NEGATIVE FOR DYSPLASIA AND MALIGNANCY.   GROSS DESCRIPTION: A. Labeled: Cecum lipoma cbxs Received: Formalin Collection time: 8:41 AM on 01/24/2021 Placed into formalin time: 8:41 AM on  01/24/2021 Tissue fragment(s): 1 Size: 0.3 x 0.3 x 0.1 cm Description: Yellow soft tissue fragment Entirely submitted in 1 cassette.  B. Labeled: Ascending colon li poma cbxs Received: Formalin Collection time: 8:47 AM on 01/24/2021 Placed into formalin time: 8:47 AM on 01/24/2021 Tissue fragment(s): 2 Size: Ranges from 0.4-0.5 cm Description: Yellow soft tissue fragments Entirely submitted in 1 cassette.  [facility] 01/24/2021  Final Diagnosis performed by Betsy Pries, MD.   Electronically signed 01/25/2021 9:11:55AM The electronic signature indicates that the named Attending Pathologist has evaluated the specimen Technical component performed at [facility], [address], [place], [place] [address] Lab: [phone] Dir: Rush Farmer, MD, MMM  Professional component performed at [facility], [place], [place], [place] [address] Lab: [phone] Dir: Dellia Nims. Rubinas, MD       Assessment & Plan:   Problem List Items Addressed This Visit   None Visit Diagnoses     Blepharitis of upper and lower eyelids of both eyes, unspecified type    -  Primary   Relevant Medications   doxycycline (VIBRA-TABS) 100 MG tablet       Improving bilateral R>L Blepharitis Already evaluated and treated by Dr Matilde Sprang topical antibiotic ointment / moisturizing drops  Discussed initially seemed to not respond to topical and then may have had allergic reaction  Now significantly improved  Will add oral antibiotic Doxycycline as a back up plan if not resolving still. Dosing instructions given. If resolves can hold antibiotic  Use warm ./ cool compresses  F/u if need   Meds ordered this encounter  Medications   doxycycline (VIBRA-TABS) 100 MG tablet    Sig: Take 1 tablet (100 mg total) by mouth 2 (two) times daily. For 10 days. Take with full glass of water, stay upright 30 min after taking.    Dispense:  20 tablet    Refill:  0      Follow up plan: Return if  symptoms worsen or fail to improve.   Nobie Putnam, [facility] 09/21/2021, 8:57 AM

## 2021-09-25 ENCOUNTER — Ambulatory Visit (INDEPENDENT_AMBULATORY_CARE_PROVIDER_SITE_OTHER): Payer: Medicare Other | Admitting: Family Medicine

## 2021-09-25 ENCOUNTER — Encounter: Payer: Self-pay | Admitting: Family Medicine

## 2021-09-25 ENCOUNTER — Other Ambulatory Visit: Payer: Self-pay | Admitting: Family Medicine

## 2021-09-25 VITALS — BP 130/80 | HR 84 | Ht 64.5 in | Wt 171.8 lb

## 2021-09-25 DIAGNOSIS — Z0000 Encounter for general adult medical examination without abnormal findings: Secondary | ICD-10-CM | POA: Diagnosis not present

## 2021-09-25 DIAGNOSIS — E782 Mixed hyperlipidemia: Secondary | ICD-10-CM

## 2021-09-25 DIAGNOSIS — I10 Essential (primary) hypertension: Secondary | ICD-10-CM | POA: Diagnosis not present

## 2021-09-25 DIAGNOSIS — F5104 Psychophysiologic insomnia: Secondary | ICD-10-CM

## 2021-09-25 DIAGNOSIS — G894 Chronic pain syndrome: Secondary | ICD-10-CM | POA: Diagnosis not present

## 2021-09-25 DIAGNOSIS — E039 Hypothyroidism, unspecified: Secondary | ICD-10-CM

## 2021-09-25 DIAGNOSIS — J432 Centrilobular emphysema: Secondary | ICD-10-CM | POA: Diagnosis not present

## 2021-09-25 DIAGNOSIS — R7309 Other abnormal glucose: Secondary | ICD-10-CM | POA: Diagnosis not present

## 2021-09-25 DIAGNOSIS — M150 Primary generalized (osteo)arthritis: Secondary | ICD-10-CM

## 2021-09-25 DIAGNOSIS — F3341 Major depressive disorder, recurrent, in partial remission: Secondary | ICD-10-CM

## 2021-09-25 DIAGNOSIS — M159 Polyosteoarthritis, unspecified: Secondary | ICD-10-CM

## 2021-09-25 DIAGNOSIS — F17210 Nicotine dependence, cigarettes, uncomplicated: Secondary | ICD-10-CM

## 2021-09-25 MED ORDER — VARENICLINE TARTRATE 0.5 MG X 11 & 1 MG X 42 PO TBPK: ORAL_TABLET | ORAL | 0 refills | Status: DC

## 2021-09-25 NOTE — Assessment & Plan Note (Signed)
Partial remission Doing well On Quetiapine

## 2021-09-25 NOTE — Assessment & Plan Note (Signed)
Chronic problem Multiple joints with pain On Meloxicam 7.'5mg'$  BID, advised switch to '15mg'$  daily PRN, caution overuse NSAID Use Tylenol PRN

## 2021-09-25 NOTE — Progress Notes (Signed)
Subjective:    Patient ID: Audrey Campbell, female    DOB: [birth year], 66 y.o.   MRN: [id number]  Audrey Campbell is a 66 y.o. female presenting on 09/25/2021 for Annual Exam   HPI  Here for Annual Physical and Due for fasting labs.   Centrilobular Emphysema  COPD Tobacco Abuse On Breztri History of COVID 08/2020 Still smoking 0.5 ppd, has not been able to quit smoking Interested in repeat Chantix   Recurrent Depression, Chronic / Anxiety / Insomnia Stable problems Continues on Quetiapine nightly Off Clonazepam  HTN On Amlodipine '10mg'$  daily  Osteoarthritis multiple joints  Health Maintenance: Future Shingles vaccine at pharmacy.     09/21/2021    8:45 AM 05/25/2021    2:36 PM 02/20/2021   11:02 AM  Depression screen PHQ 2/9  Decreased Interest 1 1 0  Down, Depressed, Hopeless 0 0 0  PHQ - 2 Score 1 1 0  Altered sleeping 1 1   Tired, decreased energy 2 2   Change in appetite 0 0   Feeling bad or failure about yourself  1 1   Trouble concentrating 1 1   Moving slowly or fidgety/restless 0 0   Suicidal thoughts 0 0   PHQ-9 Score 6 6   Difficult doing work/chores Not difficult at all Somewhat difficult     Past Medical History:  Diagnosis Date   Anxiety    Arthritis    COPD (chronic obstructive pulmonary disease) (HCC)    Hyperlipidemia    Hypertension    Hypothyroidism    On home oxygen therapy    Plantar fasciitis    Past Surgical History:  Procedure Laterality Date   bulging disc in the neck     COLONOSCOPY WITH PROPOFOL N/A 12/29/2019   Procedure: COLONOSCOPY WITH PROPOFOL;  Surgeon: Virgel Manifold, MD;  Location: [facility] ENDOSCOPY;  Service: Endoscopy;  Laterality: N/A;   COLONOSCOPY WITH PROPOFOL N/A 01/24/2021   Procedure: COLONOSCOPY WITH PROPOFOL;  Surgeon: Virgel Manifold, MD;  Location: [facility] ENDOSCOPY;  Service: Endoscopy;  Laterality: N/A;   DILATION AND CURETTAGE OF UTERUS     ESOPHAGOGASTRODUODENOSCOPY (EGD) WITH PROPOFOL N/A  12/29/2019   Procedure: ESOPHAGOGASTRODUODENOSCOPY (EGD) WITH PROPOFOL;  Surgeon: Virgel Manifold, MD;  Location: [facility] ENDOSCOPY;  Service: Endoscopy;  Laterality: N/A;   FOOT SURGERY Bilateral    heel  fasciitis     RHINOPLASTY     SHOULDER SURGERY     TUBAL LIGATION     Social History   Socioeconomic History   Marital status: Widowed    Spouse name: Not on file   Number of children: Not on file   Years of education: high school   Highest education level: High school graduate  Occupational History   Not on file  Tobacco Use   Smoking status: Every Day    Packs/day: 0.50    Types: Cigarettes   Smokeless tobacco: Never  Vaping Use   Vaping Use: Never used  Substance and Sexual Activity   Alcohol use: No   Drug use: No   Sexual activity: Yes    Birth control/protection: None  Other Topics Concern   Not on file  Social History Narrative   Not on file   Social Determinants of Health   Financial Resource Strain: Low Risk    Difficulty of Paying Living Expenses: Not hard at all  Food Insecurity: No Food Insecurity   Worried About [place] in the Last Year: Never true  Ran Out of Food in the Last Year: Never true  Transportation Needs: No Transportation Needs   Lack of Transportation (Medical): No   Lack of Transportation (Non-Medical): No  Physical Activity: Inactive   Days of Exercise per Week: 0 days   Minutes of Exercise per Session: 0 min  Stress: No Stress Concern Present   Feeling of Stress : Not at all  Social Connections: Not on file  Intimate Partner Violence: Not on file   Family History  Problem Relation Age of Onset   Diabetes Father    Stroke Father 59   Thyroid disease Father    Seizures Sister    Breast cancer Neg Hx    Current Outpatient Medications on File Prior to Visit  Medication Sig   albuterol (ACCUNEB) 0.63 MG/3ML nebulizer solution Take 1 ampule by nebulization every 6 (six) hours as needed for wheezing.   albuterol  (VENTOLIN HFA) 108 (90 Base) MCG/ACT inhaler INHALE 2 PUFFS INTO THE LUNGS EVERY 4 HOURS AS NEEDED FOR WHEEZING OR SHORTNESS OF BREATH   BREZTRI AEROSPHERE 160-9-4.8 MCG/ACT AERO INHALE 2 PUFFS BY MOUTH INTO THE LUNGS IN THE MORNING AND AT BEDTIME   butalbital-acetaminophen-caffeine (FIORICET) 50-325-40 MG tablet Take 1 tablet by mouth every 6 (six) hours as needed for headache.   diclofenac Sodium (VOLTAREN) 1 % GEL Apply topically daily as needed.   doxycycline (VIBRA-TABS) 100 MG tablet Take 1 tablet (100 mg total) by mouth 2 (two) times daily. For 10 days. Take with full glass of water, stay upright 30 min after taking.   fluticasone (FLONASE) 50 MCG/ACT nasal spray Place 2 sprays into both nostrils daily.   gabapentin (NEURONTIN) 400 MG capsule Take 1 capsule (400 mg total) by mouth 4 (four) times daily.   hydrOXYzine (VISTARIL) 25 MG capsule Take 1 capsule by mouth daily as needed.   levothyroxine (SYNTHROID) 200 MCG tablet Take 1 tablet (200 mcg total) by mouth daily before breakfast.   meloxicam (MOBIC) 15 MG tablet Take 1 tablet (15 mg total) by mouth daily as needed for pain.   montelukast (SINGULAIR) 10 MG tablet Take 1 tablet (10 mg total) by mouth daily.   neomycin-polymyxin b-dexamethasone (MAXITROL) 3.5-10000-0.1 SUSP SMARTSIG:In Eye(s)   nicotine (NICODERM CQ - DOSED IN MG/24 HOURS) 14 mg/24hr patch Place 14 mg onto the skin daily.   oxycodone (OXY-IR) 5 MG capsule Take 5 mg by mouth every 4 (four) hours as needed. 1-2 tablets every 6 hours as needed   polyethylene glycol (MIRALAX / GLYCOLAX) 17 g packet Take 17 g by mouth daily as needed.   QUEtiapine (SEROQUEL) 100 MG tablet TAKE 1.5 TABLETS (150 MG TOTAL) BY MOUTHAT BEDTIME   tobramycin-dexamethasone (TOBRADEX) ophthalmic solution Place 2 drops into both eyes every 6 (six) hours.   No current facility-administered medications on file prior to visit.    Review of Systems  Constitutional:  Negative for activity change, appetite  change, chills, diaphoresis, fatigue and fever.  HENT:  Negative for congestion and hearing loss.   Eyes:  Negative for visual disturbance.  Respiratory:  Negative for cough, chest tightness, shortness of breath and wheezing.   Cardiovascular:  Negative for chest pain, palpitations and leg swelling.  Gastrointestinal:  Negative for abdominal pain, constipation, diarrhea, nausea and vomiting.  Genitourinary:  Negative for dysuria, frequency and hematuria.  Musculoskeletal:  Negative for arthralgias and neck pain.  Skin:  Negative for rash.  Neurological:  Negative for dizziness, weakness, light-headedness, numbness and headaches.  Hematological:  Negative  for adenopathy.  Psychiatric/Behavioral:  Negative for behavioral problems, dysphoric mood and sleep disturbance.   Per HPI unless specifically indicated above      Objective:    BP 130/80   Pulse 84   Ht 5' 4.5" (1.638 m)   Wt 171 lb 12.8 oz (77.9 kg)   SpO2 96%   BMI 29.03 kg/m   Wt Readings from Last 3 Encounters:  09/25/21 171 lb 12.8 oz (77.9 kg)  09/21/21 174 lb (78.9 kg)  05/25/21 168 lb (76.2 kg)    Physical Exam Vitals and nursing note reviewed.  Constitutional:      General: She is not in acute distress.    Appearance: She is well-developed. She is not diaphoretic.     Comments: Well-appearing, comfortable, cooperative  HENT:     Head: Normocephalic and atraumatic.  Eyes:     General:        Right eye: No discharge.        Left eye: No discharge.     Conjunctiva/sclera: Conjunctivae normal.     Pupils: Pupils are equal, round, and reactive to light.  Neck:     Thyroid: No thyromegaly.  Cardiovascular:     Rate and Rhythm: Normal rate and regular rhythm.     Pulses: Normal pulses.     Heart sounds: Normal heart sounds. No murmur heard. Pulmonary:     Effort: Pulmonary effort is normal. No respiratory distress.     Breath sounds: Wheezing present. No rales.  Abdominal:     General: Bowel sounds are  normal. There is no distension.     Palpations: Abdomen is soft. There is no mass.     Tenderness: There is no abdominal tenderness.  Musculoskeletal:        General: No tenderness. Normal range of motion.     Cervical back: Normal range of motion and neck supple.     Right lower leg: No edema.     Left lower leg: No edema.     Comments: Upper / Lower Extremities: - Normal muscle tone, strength bilateral upper extremities 5/5, lower extremities 5/5  Lymphadenopathy:     Cervical: No cervical adenopathy.  Skin:    General: Skin is warm and dry.     Findings: No erythema or rash.  Neurological:     Mental Status: She is alert and oriented to person, place, and time.     Comments: Distal sensation intact to light touch all extremities  Psychiatric:        Mood and Affect: Mood normal.        Behavior: Behavior normal.        Thought Content: Thought content normal.     Comments: Well groomed, good eye contact, normal speech and thoughts   Results for orders placed or performed during the hospital encounter of 01/24/21  Surgical pathology  Result Value Ref Range   SURGICAL PATHOLOGY      SURGICAL PATHOLOGY CASE: [id number] PATIENT: [name] Surgical Pathology Report     Specimen Submitted: A. Colon lipoma, cecum; cbx B. Colon lipoma, ascending; cbx  Clinical History: Positive colorectal cancer screening R19.5.  Lipoma x2, diverticulosis      DIAGNOSIS: A. COLON LIPOMA, CECUM; COLD BIOPSY: - COLONIC MUCOSA WITH SUBMUCOSAL ADIPOSE TISSUE, COMPATIBLE WITH LIPOMA IN THE PROPER CLINICAL CONTEXT. - NEGATIVE FOR DYSPLASIA AND MALIGNANCY.  B. COLON LIPOMA, ASCENDING; COLD BIOPSY: - COLONIC MUCOSA WITH SUBMUCOSAL ADIPOSE TISSUE, COMPATIBLE WITH LIPOMA IN THE PROPER CLINICAL CONTEXT. - NEGATIVE  FOR DYSPLASIA AND MALIGNANCY.   GROSS DESCRIPTION: A. Labeled: Cecum lipoma cbxs Received: Formalin Collection time: 8:41 AM on 01/24/2021 Placed into formalin time:  8:41 AM on 01/24/2021 Tissue fragment(s): 1 Size: 0.3 x 0.3 x 0.1 cm Description: Yellow soft tissue fragment Entirely submitted in 1 cassette.  B. Labeled: Ascending colon li poma cbxs Received: Formalin Collection time: 8:47 AM on 01/24/2021 Placed into formalin time: 8:47 AM on 01/24/2021 Tissue fragment(s): 2 Size: Ranges from 0.4-0.5 cm Description: Yellow soft tissue fragments Entirely submitted in 1 cassette.  [facility] 01/24/2021  Final Diagnosis performed by Betsy Pries, MD.   Electronically signed 01/25/2021 9:11:55AM The electronic signature indicates that the named Attending Pathologist has evaluated the specimen Technical component performed at [facility], [address] [place], [place] [address] Lab: [phone] Dir: Rush Farmer, MD, MMM  Professional component performed at [facility], [facility], [place], [place], [place] [address] Lab: [phone] Dir: Dellia Nims. Rubinas, MD       Assessment & Plan:   Problem List Items Addressed This Visit     Psychophysiological insomnia    Controlled on Quetiapine '100mg'$  nightly       Primary osteoarthritis involving multiple joints    Chronic problem Multiple joints with pain On Meloxicam 7.'5mg'$  BID, advised switch to '15mg'$  daily PRN, caution overuse NSAID Use Tylenol PRN       Mixed hyperlipidemia   Relevant Orders   COMPLETE METABOLIC PANEL WITH GFR   Lipid panel   Major depressive disorder, recurrent, in partial remission (HCC)    Partial remission Doing well On Quetiapine       Hypothyroidism    Stable Labs ordered today Previously controlled Refill Levothyroxine 272mg daily       Relevant Orders   Lipid panel   TSH   T4, free   Essential hypertension    Well-controlled HTN  No known complications    Plan:  1.  Continue current BP regimen Amlodipine '10mg'$  daily 2. Encourage improved lifestyle - low sodium diet, regular exercise 3. Continue monitor BP outside office, bring  readings to next visit, if persistently >140/90 or new symptoms notify office sooner       Relevant Orders   COMPLETE METABOLIC PANEL WITH GFR   CBC with Differential/Platelet   Chronic pain syndrome   Centrilobular emphysema (HAbie    Chronic problem Active smoker, tobacco - ready to quit now Failed Symbicort Spiriva  Plan Continue Breztri [place]buterol PRN Future consider imaging CXR / LDCT for lung cancer screen and evaluation May refer to Pulmonology if not improving, may benefit from PFTs  Quit smoking re order chantix       Other Visit Diagnoses     Annual physical exam    -  Primary   Relevant Orders   COMPLETE METABOLIC PANEL WITH GFR   CBC with Differential/Platelet   Lipid panel   Hemoglobin A1c   Abnormal glucose       Relevant Orders   Hemoglobin A1c       Updated Health Maintenance information Shingrix at pharmacy covered by medicare Due for labs, fasting orders today. Pending results. Encouraged improvement to lifestyle with diet and exercise Goal of weight loss  Interested in Chantix again for quit smoking. Last attempt 10/2020   No orders of the defined types were placed in this encounter.    Follow up plan: Return in about 6 months (around 03/28/2022) for 6 month follow-up COPD, HTN, updates.  ANobie Putnam DO [place] [place]  [facility] 09/25/2021, 8:50 AM

## 2021-09-25 NOTE — Assessment & Plan Note (Signed)
Chronic problem Active smoker, tobacco - ready to quit now Failed Symbicort Spiriva  Plan Continue Breztri [place] Albuterol PRN Future consider imaging CXR / LDCT for lung cancer screen and evaluation May refer to Pulmonology if not improving, may benefit from PFTs  Quit smoking re order chantix

## 2021-09-25 NOTE — Assessment & Plan Note (Signed)
Controlled on Quetiapine 100mg nightly 

## 2021-09-25 NOTE — Patient Instructions (Addendum)
Thank you for coming to the office today.  Recommend pharmacy for a Shingles vaccine - 2 doses, 2-6 months apart, covered by the insurance. - Can feel side effect Flu Like  Medicines look correct.  Labs today, stay tuned for results.  [place] QUITLINE 1 [id number] NOW  Varenicline - Chantix Dosing: Duration Dosage  Initial 3 days  0.'5mg'$  daily  Days 4-7 0.'5mg'$  twice daily  Day 8 through the end of therapy  '1mg'$  twice daily   Prescribing Instructions: Use of "Chantix - Starting Month Pak" and "Chantix - Continuing Month Pak" provide easy to use blister packaging.  Black Box Warning: Mood Change -"Serious neuropsychiatric events have been reported." Stop Drug and contact health care provider if patient. "agitation, hostility, depressed mood or changes in thinking or behavior that are not typical for the patient are observed, or if the patient develops suicidal ideation / behavior." Document potential mood change.  Patients can continue to smoke while initiating varenicline. A "target quit date" should be set on calendar approximately Day 8 to max Day 30 (of the treatment) - PREFERRED QUIT DATE - 1 to 2 weeks from start of medicine. Advise to take this medication WITH food.  Minimizing the nausea (typically mild and transient).  If nausea occurs, consider dose reduction or temporary cessation of treatment.   The treatment should be continued for 12 weeks.  An additional 12 weeks of therapy can be used at the prescribers discretion.   Chantix can change the way people react to alcohol (decreased tolerance, increased drunkenness, unusual or aggressive behavior, memory loss) Seizures occurred in patients that had no history of seizures and in patients that had a seizure disorder that had been well controlled  Discuss Cardiovascular Safety   Please schedule a Follow-up Appointment to: Return in about 6 months (around 03/28/2022) for 6 month follow-up COPD, HTN, updates.  If you have any other  questions or concerns, please feel free to call the office or send a message through [place]. You may also schedule an earlier appointment if necessary.  Additionally, you may be receiving a survey about your experience at our office within a few days to 1 week by e-mail or mail. We value your feedback.  Nobie Putnam, DO [place]

## 2021-09-25 NOTE — Assessment & Plan Note (Signed)
Stable Labs ordered today Previously controlled Refill Levothyroxine 236mg daily

## 2021-09-25 NOTE — Assessment & Plan Note (Signed)
Well-controlled HTN  No known complications    Plan:  1.  Continue current BP regimen Amlodipine '10mg'$  daily 2. Encourage improved lifestyle - low sodium diet, regular exercise 3. Continue monitor BP outside office, bring readings to next visit, if persistently >140/90 or new symptoms notify office sooner

## 2021-09-26 LAB — HEMOGLOBIN A1C
Hgb A1c MFr Bld: 5.3 % of total Hgb (ref <5.7)
Mean Plasma Glucose: 105 mg/dL
eAG (mmol/L): 5.8 mmol/L

## 2021-09-26 LAB — CBC WITH DIFFERENTIAL/PLATELET
Absolute Monocytes: 539 cells/uL (ref 200–950)
Basophils Absolute: 69 cells/uL (ref 0–200)
Basophils Relative: 0.9 %
Eosinophils Absolute: 208 cells/uL (ref 15–500)
Eosinophils Relative: 2.7 %
HCT: 38.1 % (ref 35.0–45.0)
Hemoglobin: 12.1 g/dL (ref 11.7–15.5)
Lymphs Abs: 2349 cells/uL (ref 850–3900)
MCH: 27.3 pg (ref 27.0–33.0)
MCHC: 31.8 g/dL — ABNORMAL LOW (ref 32.0–36.0)
MCV: 86 fL (ref 80.0–100.0)
MPV: 9.9 fL (ref 7.5–12.5)
Monocytes Relative: 7 %
Neutro Abs: 4535 cells/uL (ref 1500–7800)
Neutrophils Relative %: 58.9 %
Platelets: 287 10*3/uL (ref 140–400)
RBC: 4.43 10*6/uL (ref 3.80–5.10)
RDW: 14.5 % (ref 11.0–15.0)
Total Lymphocyte: 30.5 %
WBC: 7.7 10*3/uL (ref 3.8–10.8)

## 2021-09-26 LAB — LIPID PANEL
Cholesterol: 181 mg/dL (ref <200)
HDL: 55 mg/dL (ref >=50)
LDL Cholesterol (Calc): 107 mg/dL (calc) — ABNORMAL HIGH
Non-HDL Cholesterol (Calc): 126 mg/dL (calc) (ref <130)
Total CHOL/HDL Ratio: 3.3 (calc) (ref <5.0)
Triglycerides: 93 mg/dL (ref <150)

## 2021-09-26 LAB — COMPLETE METABOLIC PANEL WITH GFR
AG Ratio: 1.4 (calc) (ref 1.0–2.5)
ALT: 8 U/L (ref 6–29)
AST: 13 U/L (ref 10–35)
Albumin: 4 g/dL (ref 3.6–5.1)
Alkaline phosphatase (APISO): 85 U/L (ref 37–153)
BUN: 10 mg/dL (ref 7–25)
CO2: 27 mmol/L (ref 20–32)
Calcium: 9.6 mg/dL (ref 8.6–10.4)
Chloride: 100 mmol/L (ref 98–110)
Creat: 0.75 mg/dL (ref 0.50–1.05)
Globulin: 2.8 g/dL (calc) (ref 1.9–3.7)
Glucose, Bld: 93 mg/dL (ref 65–99)
Potassium: 4.3 mmol/L (ref 3.5–5.3)
Sodium: 137 mmol/L (ref 135–146)
Total Bilirubin: 0.4 mg/dL (ref 0.2–1.2)
Total Protein: 6.8 g/dL (ref 6.1–8.1)
eGFR: 88 mL/min/{1.73_m2} (ref >=60)

## 2021-09-26 LAB — T4, FREE: Free T4: 1.1 ng/dL (ref 0.8–1.8)

## 2021-09-26 LAB — TSH: TSH: 3.1 mIU/L (ref 0.40–4.50)

## 2021-10-17 DIAGNOSIS — J449 Chronic obstructive pulmonary disease, unspecified: Secondary | ICD-10-CM | POA: Diagnosis not present

## 2021-10-22 ENCOUNTER — Other Ambulatory Visit: Payer: Self-pay | Admitting: Family Medicine

## 2021-10-22 DIAGNOSIS — J432 Centrilobular emphysema: Secondary | ICD-10-CM

## 2021-10-23 NOTE — Telephone Encounter (Signed)
Requested medication (s) are due for refill today: ?  Requested medication (s) are on the active medication list: Yes  Last refill:  ?  Future visit scheduled: Yes  Notes to clinic:  See request.    Requested Prescriptions  Pending Prescriptions Disp Refills   albuterol (PROVENTIL) (2.5 MG/3ML) 0.083% nebulizer solution [Pharmacy Med Name: albuterol sulfate 2.5 mg/3 mL (0.083 %) solution for nebulization]  11    Sig: USE 1 VIAL IN NEBULIZER 4 TIMES DAILY - And As Needed     Pulmonology:  Beta Agonists 2 Passed - 10/22/2021 12:42 PM      Passed - Last BP in normal range    BP Readings from Last 1 Encounters:  09/25/21 130/80         Passed - Last Heart Rate in normal range    Pulse Readings from Last 1 Encounters:  09/25/21 84         Passed - Valid encounter within last 12 months    Recent Outpatient Visits           4 weeks ago Annual physical exam   [facility], Netta Neat, DO   1 month ago Blepharitis of upper and lower eyelids of both eyes, unspecified type   [facility], Netta Neat, DO   5 months ago Centrilobular emphysema [facility])   [facility] Smitty Cords, DO   11 months ago Centrilobular emphysema [facility])   [facility] Smitty Cords, DO   1 year ago Annual physical exam   [facility] Smitty Cords, DO       Future Appointments             In 4 months  [facility]   In 5 months Althea Charon, Netta Neat, DO [facility], [facility]

## 2021-10-24 DIAGNOSIS — H35363 Drusen (degenerative) of macula, bilateral: Secondary | ICD-10-CM | POA: Diagnosis not present

## 2021-10-24 DIAGNOSIS — H2513 Age-related nuclear cataract, bilateral: Secondary | ICD-10-CM | POA: Diagnosis not present

## 2021-10-24 DIAGNOSIS — J449 Chronic obstructive pulmonary disease, unspecified: Secondary | ICD-10-CM | POA: Diagnosis not present

## 2021-10-24 DIAGNOSIS — H04123 Dry eye syndrome of bilateral lacrimal glands: Secondary | ICD-10-CM | POA: Diagnosis not present

## 2021-10-24 DIAGNOSIS — H524 Presbyopia: Secondary | ICD-10-CM | POA: Diagnosis not present

## 2021-10-24 DIAGNOSIS — H5213 Myopia, bilateral: Secondary | ICD-10-CM | POA: Diagnosis not present

## 2021-10-24 DIAGNOSIS — H02882 Meibomian gland dysfunction right lower eyelid: Secondary | ICD-10-CM | POA: Diagnosis not present

## 2021-10-24 DIAGNOSIS — H02885 Meibomian gland dysfunction left lower eyelid: Secondary | ICD-10-CM | POA: Diagnosis not present

## 2021-10-24 DIAGNOSIS — H52223 Regular astigmatism, bilateral: Secondary | ICD-10-CM | POA: Diagnosis not present

## 2021-11-01 ENCOUNTER — Other Ambulatory Visit: Payer: Self-pay | Admitting: Family Medicine

## 2021-11-15 DIAGNOSIS — M25569 Pain in unspecified knee: Secondary | ICD-10-CM | POA: Diagnosis not present

## 2021-11-15 DIAGNOSIS — Z79891 Long term (current) use of opiate analgesic: Secondary | ICD-10-CM | POA: Diagnosis not present

## 2021-11-15 DIAGNOSIS — M545 Low back pain, unspecified: Secondary | ICD-10-CM | POA: Diagnosis not present

## 2021-11-15 DIAGNOSIS — G894 Chronic pain syndrome: Secondary | ICD-10-CM | POA: Diagnosis not present

## 2021-11-15 DIAGNOSIS — M722 Plantar fascial fibromatosis: Secondary | ICD-10-CM | POA: Diagnosis not present

## 2021-11-15 DIAGNOSIS — M542 Cervicalgia: Secondary | ICD-10-CM | POA: Diagnosis not present

## 2021-11-15 DIAGNOSIS — M25519 Pain in unspecified shoulder: Secondary | ICD-10-CM | POA: Diagnosis not present

## 2021-11-16 DIAGNOSIS — J449 Chronic obstructive pulmonary disease, unspecified: Secondary | ICD-10-CM | POA: Diagnosis not present

## 2021-12-17 DIAGNOSIS — J449 Chronic obstructive pulmonary disease, unspecified: Secondary | ICD-10-CM | POA: Diagnosis not present

## 2022-01-10 DIAGNOSIS — J449 Chronic obstructive pulmonary disease, unspecified: Secondary | ICD-10-CM | POA: Diagnosis not present

## 2022-01-17 DIAGNOSIS — J449 Chronic obstructive pulmonary disease, unspecified: Secondary | ICD-10-CM | POA: Diagnosis not present

## 2022-02-14 DIAGNOSIS — M545 Low back pain, unspecified: Secondary | ICD-10-CM | POA: Diagnosis not present

## 2022-02-14 DIAGNOSIS — M722 Plantar fascial fibromatosis: Secondary | ICD-10-CM | POA: Diagnosis not present

## 2022-02-14 DIAGNOSIS — M25519 Pain in unspecified shoulder: Secondary | ICD-10-CM | POA: Diagnosis not present

## 2022-02-14 DIAGNOSIS — Z79891 Long term (current) use of opiate analgesic: Secondary | ICD-10-CM | POA: Diagnosis not present

## 2022-02-14 DIAGNOSIS — G894 Chronic pain syndrome: Secondary | ICD-10-CM | POA: Diagnosis not present

## 2022-02-14 DIAGNOSIS — M25569 Pain in unspecified knee: Secondary | ICD-10-CM | POA: Diagnosis not present

## 2022-02-14 DIAGNOSIS — M542 Cervicalgia: Secondary | ICD-10-CM | POA: Diagnosis not present

## 2022-02-16 DIAGNOSIS — J449 Chronic obstructive pulmonary disease, unspecified: Secondary | ICD-10-CM | POA: Diagnosis not present

## 2022-02-18 ENCOUNTER — Other Ambulatory Visit: Payer: Self-pay | Admitting: Family Medicine

## 2022-02-18 DIAGNOSIS — Z1231 Encounter for screening mammogram for malignant neoplasm of breast: Secondary | ICD-10-CM

## 2022-02-20 ENCOUNTER — Other Ambulatory Visit: Payer: Self-pay | Admitting: Family Medicine

## 2022-02-20 DIAGNOSIS — F5104 Psychophysiologic insomnia: Secondary | ICD-10-CM

## 2022-02-21 NOTE — Telephone Encounter (Signed)
Requested medication (s) are due for refill today:   Provider to review  Requested medication (s) are on the active medication list:   Yes  Future visit scheduled:   Yes   Last ordered: 05/25/2021 #135, 1 refill  Non delegated refill    Requested Prescriptions  Pending Prescriptions Disp Refills   QUEtiapine (SEROQUEL) 100 MG tablet [Pharmacy Med Name: QUETIAPINE FUMARATE 100 MG TAB] 135 tablet 1    Sig: TAKE 1 AND 1/2 TABLETS BY MOUTH AT BEDTIME     Not Delegated - Psychiatry:  Antipsychotics - Second Generation (Atypical) - quetiapine Failed - 02/20/2022 10:36 AM      Failed - This refill cannot be delegated      Failed - Lipid Panel in normal range within the last 12 months    Cholesterol  Date Value Ref Range Status  09/25/2021 181 <200 mg/dL Final   LDL Cholesterol (Calc)  Date Value Ref Range Status  09/25/2021 107 (H) mg/dL (calc) Final    Comment:    Reference range: <100 . Desirable range <100 mg/dL for primary prevention;   <70 mg/dL for patients with CHD or diabetic patients  with > or = 2 CHD risk factors. Marland Kitchen LDL-C is now calculated using the Martin-Hopkins  calculation, which is a validated novel method providing  better accuracy than the Friedewald equation in the  estimation of LDL-C.  Cresenciano Genre et al. Annamaria Helling. 8882;800(34): 2061-2068  (http://education.[employer].com/faq/FAQ164)    HDL  Date Value Ref Range Status  09/25/2021 55 > OR = 50 mg/dL Final   Triglycerides  Date Value Ref Range Status  09/25/2021 93 <150 mg/dL Final         Failed - CMP within normal limits and completed in the last 12 months    No results found for: "ALBUMIN", "ALBMS", "POCALB", "ALBUMINELP" Alkaline phosphatase (APISO)  Date Value Ref Range Status  09/25/2021 85 37 - 153 U/L Final   ALT  Date Value Ref Range Status  09/25/2021 8 6 - 29 U/L Final   AST  Date Value Ref Range Status  09/25/2021 13 10 - 35 U/L Final   BUN  Date Value Ref Range Status   09/25/2021 10 7 - 25 mg/dL Final   Calcium  Date Value Ref Range Status  09/25/2021 9.6 8.6 - 10.4 mg/dL Final   CO2  Date Value Ref Range Status  09/25/2021 27 20 - 32 mmol/L Final   Bicarbonate  Date Value Ref Range Status  12/10/2017 29.6 (H) 20.0 - 28.0 mmol/L Final   Creat  Date Value Ref Range Status  09/25/2021 0.75 0.50 - 1.05 mg/dL Final   Glucose, Bld  Date Value Ref Range Status  09/25/2021 93 65 - 99 mg/dL Final    Comment:    .            Fasting reference interval .    Potassium  Date Value Ref Range Status  09/25/2021 4.3 3.5 - 5.3 mmol/L Final   Sodium  Date Value Ref Range Status  09/25/2021 137 135 - 146 mmol/L Final   Total Bilirubin  Date Value Ref Range Status  09/25/2021 0.4 0.2 - 1.2 mg/dL Final   Total Protein  Date Value Ref Range Status  09/25/2021 6.8 6.1 - 8.1 g/dL Final   GFR, Est African American  Date Value Ref Range Status  10/16/2020 75 > OR = 60 mL/min/1.51m Final   eGFR  Date Value Ref Range Status  09/25/2021 88 > OR =  60 mL/min/1.4m Final    Comment:    The eGFR is based on the CKD-EPI 2021 equation. To calculate  the new eGFR from a previous Creatinine or Cystatin C result, go to https://www.kidney.org/professionals/ kdoqi/gfr%5Fcalculator    GFR, Est Non African American  Date Value Ref Range Status  10/16/2020 64 > OR = 60 mL/min/1.792mFinal         Passed - TSH in normal range and within 360 days    TSH  Date Value Ref Range Status  09/25/2021 3.10 0.40 - 4.50 mIU/L Final         Passed - Completed PHQ-2 or PHQ-9 in the last 360 days      Passed - Last BP in normal range    BP Readings from Last 1 Encounters:  09/25/21 130/80         Passed - Last Heart Rate in normal range    Pulse Readings from Last 1 Encounters:  09/25/21 84         Passed - Valid encounter within last 6 months    Recent Outpatient Visits           4 months ago Annual physical exam   [facility]   5 months ago Blepharitis of upper and lower eyelids of both eyes, unspecified type   [facility]   9 months ago [facility]obular [facility] ([facility]  [facility]   1 year ago [facility]obular [facility]ysema ([facility]  [facility]   1 year ago Annual physical exam   [place]       Future Appointments             In 1 week  [facility] CBC within normal limits and completed in the last 12 months    WBC  Date Value Ref Range Status  09/25/2021 7.7 3.8 - 10.8 Thousand/uL Final   RBC  Date Value Ref Range Status  09/25/2021 4.43 3.80 - 5.10 Million/uL Final   Hemoglobin  Date Value Ref Range Status  09/25/2021 12.1 11.7 - 15.5 g/dL Final   HCT  Date Value Ref Range Status  09/25/2021 38.1 35.0 - 45.0 % Final   MCHC  Date Value Ref Range Status  09/25/2021 31.8 (L) 32.0 - 36.0 g/dL Final   [facility] PsDate Value Ref Range Status  09/25/2021 27.3 27.0 - 33.0 pg Final   MCV  Date Value Ref Range Status  09/25/2021 86.0 80.0 - 100.0 fL Final   No results found for: "PLTCOUNTKUC", "LABPLAT", "POCPLA" RDW  Date Value Ref Range Status  09/25/2021 14.5 11.0 - 15.0 % Final

## 2022-02-26 ENCOUNTER — Ambulatory Visit: Payer: Medicare Other

## 2022-03-01 ENCOUNTER — Ambulatory Visit (INDEPENDENT_AMBULATORY_CARE_PROVIDER_SITE_OTHER): Payer: Medicare Other

## 2022-03-01 VITALS — BP 140/80 | Ht 64.5 in | Wt 166.8 lb

## 2022-03-01 DIAGNOSIS — Z0000 Encounter for general adult medical examination without abnormal findings: Secondary | ICD-10-CM

## 2022-03-01 NOTE — Patient Instructions (Signed)
[name] , Thank you for taking time to come for your Medicare Wellness Visit. I appreciate your ongoing commitment to your health goals. Please review the following plan we discussed and let me know if I can assist you in the future.   Screening recommendations/referrals: Colonoscopy: 01/24/21 Mammogram: scheduled for 03/29/22 Bone Density: had one 3 years ago, due in 2 years Recommended yearly ophthalmology/optometry visit for glaucoma screening and checkup Recommended yearly dental visit for hygiene and checkup  Vaccinations: Influenza vaccine: 02/01/21, states had flu shot already this year Pneumococcal vaccine: 10/23/20 Tdap vaccine: n/d Shingles vaccine: n/d   Covid-19:07/15/19, 08/11/19, 02/21/20, 02/01/21, 01/27/22  Advanced directives: no  Conditions/risks identified: none  Next appointment: Follow up in one year for your annual wellness visit 03/07/23 @ 10:55 am in person   Preventive Care 65 Years and Older, Female Preventive care refers to lifestyle choices and visits with your health care provider that can promote health and wellness. What does preventive care include? A yearly physical exam. This is also called an annual well check. Dental exams once or twice a year. Routine eye exams. Ask your health care provider how often you should have your eyes checked. Personal lifestyle choices, including: Daily care of your teeth and gums. Regular physical activity. Eating a healthy diet. Avoiding tobacco and drug use. Limiting alcohol use. Practicing safe sex. Taking low-dose aspirin every day. Taking vitamin and mineral supplements as recommended by your health care provider. What happens during an annual well check? The services and screenings done by your health care provider during your annual well check will depend on your age, overall health, lifestyle risk factors, and family history of disease. Counseling  Your health care provider may ask you questions about  your: Alcohol use. Tobacco use. Drug use. Emotional well-being. Home and relationship well-being. Sexual activity. Eating habits. History of falls. Memory and ability to understand (cognition). Work and work Statistician. Reproductive health. Screening  You may have the following tests or measurements: Height, weight, and BMI. Blood pressure. Lipid and cholesterol levels. These may be checked every 5 years, or more frequently if you are over 34 years old. Skin check. Lung cancer screening. You may have this screening every year starting at age 48 if you have a 30-pack-year history of smoking and currently smoke or have quit within the past 15 years. Fecal occult blood test (FOBT) of the stool. You may have this test every year starting at age 6. Flexible sigmoidoscopy or colonoscopy. You may have a sigmoidoscopy every 5 years or a colonoscopy every 10 years starting at age 66. Hepatitis C blood test. Hepatitis B blood test. Sexually transmitted disease (STD) testing. Diabetes screening. This is done by checking your blood sugar (glucose) after you have not eaten for a while (fasting). You may have this done every 1-3 years. Bone density scan. This is done to screen for osteoporosis. You may have this done starting at age 21. Mammogram. This may be done every 1-2 years. Talk to your health care provider about how often you should have regular mammograms. Talk with your health care provider about your test results, treatment options, and if necessary, the need for more tests. Vaccines  Your health care provider may recommend certain vaccines, such as: Influenza vaccine. This is recommended every year. Tetanus, diphtheria, and acellular pertussis (Tdap, Td) vaccine. You may need a Td booster every 10 years. Zoster vaccine. You may need this after age 85. Pneumococcal 13-valent conjugate (PCV13) vaccine. One dose is recommended  after age 14. Pneumococcal polysaccharide (PPSV23) vaccine.  One dose is recommended after age 33. Talk to your health care provider about which screenings and vaccines you need and how often you need them. This information is not intended to replace advice given to you by your health care provider. Make sure you discuss any questions you have with your health care provider. Document Released: 05/12/2015 Document Revised: 01/03/2016 Document Reviewed: 02/14/2015 Elsevier Interactive Patient Education  2017 [place] Prevention in the Home Falls can cause injuries. They can happen to people of all ages. There are many things you can do to make your home safe and to help prevent falls. What can I do on the outside of my home? Regularly fix the edges of walkways and driveways and fix any cracks. Remove anything that might make you trip as you walk through a door, such as a raised step or threshold. Trim any bushes or trees on the path to your home. Use bright outdoor lighting. Clear any walking paths of anything that might make someone trip, such as rocks or tools. Regularly check to see if handrails are loose or broken. Make sure that both sides of any steps have handrails. Any raised decks and porches should have guardrails on the edges. Have any leaves, snow, or ice cleared regularly. Use sand or salt on walking paths during winter. Clean up any spills in your garage right away. This includes oil or grease spills. What can I do in the bathroom? Use night lights. Install grab bars by the toilet and in the tub and shower. Do not use towel bars as grab bars. Use non-skid mats or decals in the tub or shower. If you need to sit down in the shower, use a plastic, non-slip stool. Keep the floor dry. Clean up any water that spills on the floor as soon as it happens. Remove soap buildup in the tub or shower regularly. Attach bath mats securely with double-sided non-slip rug tape. Do not have throw rugs and other things on the floor that can make  you trip. What can I do in the bedroom? Use night lights. Make sure that you have a light by your bed that is easy to reach. Do not use any sheets or blankets that are too big for your bed. They should not hang down onto the floor. Have a firm chair that has side arms. You can use this for support while you get dressed. Do not have throw rugs and other things on the floor that can make you trip. What can I do in the kitchen? Clean up any spills right away. Avoid walking on wet floors. Keep items that you use a lot in easy-to-reach places. If you need to reach something above you, use a strong step stool that has a grab bar. Keep electrical cords out of the way. Do not use floor polish or wax that makes floors slippery. If you must use wax, use non-skid floor wax. Do not have throw rugs and other things on the floor that can make you trip. What can I do with my stairs? Do not leave any items on the stairs. Make sure that there are handrails on both sides of the stairs and use them. Fix handrails that are broken or loose. Make sure that handrails are as long as the stairways. Check any carpeting to make sure that it is firmly attached to the stairs. Fix any carpet that is loose or worn. Avoid having throw  rugs at the top or bottom of the stairs. If you do have throw rugs, attach them to the floor with carpet tape. Make sure that you have a light switch at the top of the stairs and the bottom of the stairs. If you do not have them, ask someone to add them for you. What else can I do to help prevent falls? Wear shoes that: Do not have high heels. Have rubber bottoms. Are comfortable and fit you well. Are closed at the toe. Do not wear sandals. If you use a stepladder: Make sure that it is fully opened. Do not climb a closed stepladder. Make sure that both sides of the stepladder are locked into place. Ask someone to hold it for you, if possible. Clearly mark and make sure that you can  see: Any grab bars or handrails. First and last steps. Where the edge of each step is. Use tools that help you move around (mobility aids) if they are needed. These include: Canes. Walkers. Scooters. Crutches. Turn on the lights when you go into a dark area. Replace any light bulbs as soon as they burn out. Set up your furniture so you have a clear path. Avoid moving your furniture around. If any of your floors are uneven, fix them. If there are any pets around you, be aware of where they are. Review your medicines with your doctor. Some medicines can make you feel dizzy. This can increase your chance of falling. Ask your doctor what other things that you can do to help prevent falls. This information is not intended to replace advice given to you by your health care provider. Make sure you discuss any questions you have with your health care provider. Document Released: 02/09/2009 Document Revised: 09/21/2015 Document Reviewed: 05/20/2014 Elsevier Interactive Patient Education  2017 [employer].

## 2022-03-01 NOTE — Progress Notes (Signed)
Subjective:   Audrey Campbell is a 66 y.o. female who presents for Medicare Annual (Subsequent) preventive examination.  Review of Systems     Cardiac Risk Factors include: advanced age (>55mn, >>47women);dyslipidemia;hypertension;smoking/ tobacco exposure;sedentary lifestyle     Objective:    Today's Vitals   03/01/22 1117  BP: (!) 140/80  Weight: 166 lb 12.8 oz (75.7 kg)  Height: 5' 4.5" (1.638 m)   Body mass index is 28.19 kg/m.     03/01/2022   11:23 AM 02/20/2021   11:01 AM 01/24/2021    7:22 AM 12/29/2019   10:17 AM 12/10/2017    3:32 AM 12/10/2017   12:04 AM  Advanced Directives  Does Patient Have a Medical Advance Directive? No No No No No No  Would patient like information on creating a medical advance directive? No - Patient declined   No - Patient declined No - Patient declined No - Patient declined    Current Medications (verified) Outpatient Encounter Medications as of 03/01/2022  Medication Sig   albuterol (ACCUNEB) 0.63 MG/3ML nebulizer solution Take 1 ampule by nebulization every 6 (six) hours as needed for wheezing.   albuterol (VENTOLIN HFA) 108 (90 Base) MCG/ACT inhaler INHALE 2 PUFFS INTO THE LUNGS EVERY 4 HOURS AS NEEDED FOR WHEEZING OR SHORTNESS OF BREATH   BREZTRI AEROSPHERE 160-9-4.8 MCG/ACT AERO INHALE 2 PUFFS BY MOUTH INTO THE LUNGS IN THE MORNING AND AT BEDTIME   diclofenac Sodium (VOLTAREN) 1 % GEL Apply topically daily as needed.   fluticasone (FLONASE) 50 MCG/ACT nasal spray Place 2 sprays into both nostrils daily.   gabapentin (NEURONTIN) 400 MG capsule Take 1 capsule (400 mg total) by mouth 4 (four) times daily.   hydrOXYzine (VISTARIL) 25 MG capsule Take 1 capsule by mouth daily as needed.   montelukast (SINGULAIR) 10 MG tablet Take 1 tablet (10 mg total) by mouth daily.   Oxycodone HCl 10 MG TABS Take 10 mg by mouth 4 (four) times daily as needed.   polyethylene glycol (MIRALAX / GLYCOLAX) 17 g packet Take 17 g by mouth daily as needed.    QUEtiapine (SEROQUEL) 100 MG tablet TAKE 1 AND 1/2 TABLETS BY MOUTH AT BEDTIME   butalbital-acetaminophen-caffeine (FIORICET) 50-325-40 MG tablet Take 1 tablet by mouth every 6 (six) hours as needed for headache. (Patient not taking: Reported on 03/01/2022)   doxycycline (VIBRA-TABS) 100 MG tablet Take 1 tablet (100 mg total) by mouth 2 (two) times daily. For 10 days. Take with full glass of water, stay upright 30 min after taking. (Patient not taking: Reported on 03/01/2022)   meloxicam (MOBIC) 15 MG tablet Take 1 tablet (15 mg total) by mouth daily as needed for pain. (Patient not taking: Reported on 03/01/2022)   neomycin-polymyxin b-dexamethasone (MAXITROL) 3.5-10000-0.1 SUSP SMARTSIG:In Eye(s) (Patient not taking: Reported on 03/01/2022)   nicotine (NICODERM CQ - DOSED IN MG/24 HOURS) 14 mg/24hr patch Place 14 mg onto the skin daily. (Patient not taking: Reported on 03/01/2022)   oxycodone (OXY-IR) 5 MG capsule Take 5 mg by mouth every 4 (four) hours as needed. 1-2 tablets every 6 hours as needed (Patient not taking: Reported on 03/01/2022)   tobramycin-dexamethasone (TOBRADEX) ophthalmic solution Place 2 drops into both eyes every 6 (six) hours. (Patient not taking: Reported on 03/01/2022)   [DISCONTINUED] albuterol (PROVENTIL) (2.5 MG/3ML) 0.083% nebulizer solution USE 1 VIAL IN NEBULIZER 4 TIMES DAILY - And As Needed   [DISCONTINUED] varenicline (CHANTIX PAK) 0.5 MG X 11 & 1 MG X 42  tablet Take one 0.5 mg tab by mouth once daily for 3 days, increase to one 0.5 mg twice daily for 4 days, then increase to one 1 mg twice daily. (Patient not taking: Reported on 03/01/2022)   No facility-administered encounter medications on file as of 03/01/2022.    Allergies (verified) Sulfa antibiotics and Penicillins   History: Past Medical History:  Diagnosis Date   Anxiety    Arthritis    COPD (chronic obstructive pulmonary disease) ([place])    Hyperlipidemia    Hypertension    Hypothyroidism    On home  oxygen therapy    Plantar fasciitis    Past Surgical History:  Procedure Laterality Date   bulging disc in the neck     COLONOSCOPY WITH PROPOFOL N/A 12/29/2019   Procedure: COLONOSCOPY WITH PROPOFOL;  Surgeon: Virgel Manifold, MD;  Location: [facility] ENDOSCOPY;  Service: Endoscopy;  Laterality: N/A;   COLONOSCOPY WITH PROPOFOL N/A 01/24/2021   Procedure: COLONOSCOPY WITH PROPOFOL;  Surgeon: Virgel Manifold, MD;  Location: [facility] ENDOSCOPY;  Service: Endoscopy;  Laterality: N/A;   DILATION AND CURETTAGE OF UTERUS     ESOPHAGOGASTRODUODENOSCOPY (EGD) WITH PROPOFOL N/A 12/29/2019   Procedure: ESOPHAGOGASTRODUODENOSCOPY (EGD) WITH PROPOFOL;  Surgeon: Virgel Manifold, MD;  Location: [facility] ENDOSCOPY;  Service: Endoscopy;  Laterality: N/A;   FOOT SURGERY Bilateral    heel  fasciitis     RHINOPLASTY     SHOULDER SURGERY     TUBAL LIGATION     Family History  Problem Relation Age of Onset   Diabetes Father    Stroke Father 32   Thyroid disease Father    Seizures Sister    Breast cancer Neg Hx    Social History   Socioeconomic History   Marital status: Widowed    Spouse name: Not on file   Number of children: Not on file   Years of education: high school   Highest education level: High school graduate  Occupational History   Not on file  Tobacco Use   Smoking status: Every Day    Packs/day: 0.50    Types: Cigarettes   Smokeless tobacco: Never  Vaping Use   Vaping Use: Never used  Substance and Sexual Activity   Alcohol use: No   Drug use: No   Sexual activity: Yes    Birth control/protection: None  Other Topics Concern   Not on file  Social History Narrative   Not on file   Social Determinants of Health   Financial Resource Strain: Low Risk  (03/01/2022)   Overall Financial Resource Strain (CARDIA)    Difficulty of Paying Living Expenses: Not hard at all  Food Insecurity: No Food Insecurity (03/01/2022)   Hunger Vital Sign    Worried About Running Out of Food in  the Last Year: Never true    Ran Out of Food in the Last Year: Never true  Transportation Needs: No Transportation Needs (03/01/2022)   PRAPARE - Hydrologist (Medical): No    Lack of Transportation (Non-Medical): No  Physical Activity: Inactive (03/01/2022)   Exercise Vital Sign    Days of Exercise per Week: 0 days    Minutes of Exercise per Session: 0 min  Stress: No Stress Concern Present (03/01/2022)   [place]    Feeling of Stress : Only a little  Social Connections: Moderately Integrated (03/01/2022)   Social Connection and Isolation Panel [NHANES]    Frequency  of Communication with Friends and Family: More than three times a week    Frequency of Social Gatherings with Friends and Family: More than three times a week    Attends Religious Services: More than 4 times per year    Active Member of [employer] or Organizations: No    Attends Music therapist: Never    Marital Status: Married    Tobacco Counseling Ready to quit: Not Answered Counseling given: Not Answered   Clinical Intake:  Pre-visit preparation completed: Yes  Pain : No/denies pain     Nutritional Risks: None Diabetes: No  How often do you need to have someone help you when you read instructions, pamphlets, or other written materials from your doctor or pharmacy?: 1 - Never  Diabetic?no  Interpreter Needed?: No  Information entered by :: Kirke Shaggy, LPN   Activities of Daily Living    03/01/2022   11:24 AM 05/25/2021    2:32 PM  In your present state of health, do you have any difficulty performing the following activities:  Hearing? 0 0  Vision? 0 0  Difficulty concentrating or making decisions? 0 0  Walking or climbing stairs? 1 1  Dressing or bathing? 0 0  Doing errands, shopping? 0 0  Preparing Food and eating ? N   Using the Toilet? N   In the past six months, have you accidently  leaked urine? N   Do you have problems with loss of bowel control? N   Managing your Medications? N   Managing your Finances? N   Housekeeping or managing your Housekeeping? N     Patient Care Team: [name], DO as PCP - General (Family Medicine) Rico Junker, RN as Registered Nurse Theodore Demark, RN (Inactive) as Registered Nurse  Indicate any recent Medical Services you may have received from other than [facility] providers in the past year (date may be approximate).     Assessment:   This is a routine wellness examination for Hollie.  Hearing/Vision screen Hearing Screening - Comments:: No aids Vision Screening - Comments:: Readers- Dr.Keith Nice  Dietary issues and exercise activities discussed: Current Exercise Habits: The patient does not participate in regular exercise at present   Goals Addressed             This Visit's Progress    DIET - EAT MORE FRUITS AND VEGETABLES         Depression Screen    03/01/2022   11:22 AM 09/21/2021    8:45 AM 05/25/2021    2:36 PM 02/20/2021   11:02 AM 11/21/2020    1:40 PM 10/23/2020    1:50 PM 05/22/2020    1:48 PM  PHQ 2/9 Scores  PHQ - 2 Score 0 1 1 0 0 1 2  PHQ- 9 Score 0 6 6  0 5 3    Fall Risk    03/01/2022   11:24 AM 09/21/2021    8:45 AM 05/25/2021    2:36 PM 02/20/2021   11:02 AM 11/21/2020    1:40 PM  Fall Risk   Falls in the past year? 0 0 0 0 0  Number falls in past yr: 0 0 0  0  Injury with Fall? 0 0 0  0  Risk for fall due to : No Fall Risks No Fall Risks No Fall Risks Medication side effect   Follow up Falls prevention discussed;Falls evaluation completed Falls evaluation completed Falls evaluation completed Falls evaluation completed;Education provided;Falls prevention  discussed Falls evaluation completed    FALL RISK PREVENTION PERTAINING TO THE HOME:  Any stairs in or around the home? No  If so, are there any without handrails? No  Home free of loose throw rugs in walkways, pet  beds, electrical cords, etc? Yes  Adequate lighting in your home to reduce risk of falls? Yes   ASSISTIVE DEVICES UTILIZED TO PREVENT FALLS:  Life alert? No  Use of a cane, walker or w/c? No  Grab bars in the bathroom? Yes  Shower chair or bench in shower? No  Elevated toilet seat or a handicapped toilet? No   TIMED UP AND GO:  Was the test performed? Yes .  Length of time to ambulate 10 feet: 4 sec.   Gait steady and fast without use of assistive device  Cognitive Function:        03/01/2022   11:25 AM 02/20/2021   11:03 AM  6CIT Screen  What Year? 0 points 0 points  What month? 0 points 0 points  What time? 0 points 0 points  Count back from 20 0 points 0 points  Months in reverse 0 points 0 points  Repeat phrase 0 points 0 points  Total Score 0 points 0 points    Immunizations Immunization History  Administered Date(s) Administered   Influenza, High Dose Seasonal PF 02/01/2021   Influenza,inj,Quad PF,6+ Mos 01/04/2017, 12/31/2018   Influenza-Unspecified 02/09/2020, 01/27/2022   Moderna Covid-19 Vaccine Bivalent Booster 21yr & up 02/01/2021, 01/27/2022   PFIZER(Purple Top)SARS-COV-2 Vaccination 07/15/2019, 08/11/2019, 02/21/2020   PNEUMOCOCCAL CONJUGATE-20 10/23/2020   Pneumococcal Polysaccharide-23 12/11/2017    TDAP status: Due, Education has been provided regarding the importance of this vaccine. Advised may receive this vaccine at local pharmacy or Health Dept. Aware to provide a copy of the vaccination record if obtained from local pharmacy or Health Dept. Verbalized acceptance and understanding.  Flu Vaccine status: Up to date  Pneumococcal vaccine status: Up to date  Covid-19 vaccine status: Completed vaccines  Qualifies for Shingles Vaccine? Yes   Zostavax completed No   Shingrix Completed?: No.    Education has been provided regarding the importance of this vaccine. Patient has been advised to call insurance company to determine out of pocket  expense if they have not yet received this vaccine. Advised may also receive vaccine at local pharmacy or Health Dept. Verbalized acceptance and understanding.  Screening Tests Health Maintenance  Topic Date Due   TETANUS/TDAP  Never done   Zoster Vaccines- Shingrix (1 of 2) Never done   DEXA SCAN  Never done   COVID-19 Vaccine (6 - Pfizer risk series) 03/24/2022   Fecal DNA (Cologuard)  12/03/2022   Medicare Annual Wellness (AWV)  03/02/2023   MAMMOGRAM  03/29/2023   Pneumonia Vaccine 66 Years old  Completed   INFLUENZA VACCINE  Completed   Hepatitis C Screening  Completed   HPV VACCINES  Aged Out   COLONOSCOPY (Pts [id number] coverage will need to be confirmed)  Discontinued    Health Maintenance  Health Maintenance Due  Topic Date Due   TETANUS/TDAP  Never done   Zoster Vaccines- Shingrix (1 of 2) Never done   DEXA SCAN  Never done    Colorectal cancer screening: Type of screening: Colonoscopy. Completed 01/24/21. Repeat every 3 years  Mammogram status: Completed 03/28/21. Repeat every year- scheduled for 03/29/22  Bone Density status: Completed had done 3 years ago, result was normal. Results reflect: Bone density results: NORMAL. Repeat every 5 years.  Lung Cancer Screening: (Low Dose CT Chest recommended if Age 53-80 years, 30 pack-year currently smoking OR have quit w/in 15years.) does not qualify.   Additional Screening:  Hepatitis C Screening: does qualify; Completed 08/28/16  Vision Screening: Recommended annual ophthalmology exams for early detection of glaucoma and other disorders of the eye. Is the patient up to date with their annual eye exam?  Yes  Who is the provider or what is the name of the office in which the patient attends annual eye exams? Dr.Nice If pt is not established with a provider, would they like to be referred to a provider to establish care? No .   Dental Screening: Recommended annual dental exams for proper oral hygiene  Community  Resource Referral / Chronic Care Management: CRR required this visit?  No   CCM required this visit?  No      Plan:     I have personally reviewed and noted the following in the patient's chart:   Medical and social history Use of alcohol, tobacco or illicit drugs  Current medications and supplements including opioid prescriptions. Patient is currently taking opioid prescriptions. Information provided to patient regarding non-opioid alternatives. Patient advised to discuss non-opioid treatment plan with their provider. Functional ability and status Nutritional status Physical activity Advanced directives List of other physicians Hospitalizations, surgeries, and ER visits in previous 12 months Vitals Screenings to include cognitive, depression, and falls Referrals and appointments  In addition, I have reviewed and discussed with patient certain preventive protocols, quality metrics, and best practice recommendations. A written personalized care plan for preventive services as well as general preventive health recommendations were provided to patient.     Dionisio David, LPN   [id number]/[id number]   Nurse Notes: none

## 2022-03-05 DIAGNOSIS — J449 Chronic obstructive pulmonary disease, unspecified: Secondary | ICD-10-CM | POA: Diagnosis not present

## 2022-03-19 DIAGNOSIS — J449 Chronic obstructive pulmonary disease, unspecified: Secondary | ICD-10-CM | POA: Diagnosis not present

## 2022-03-25 ENCOUNTER — Other Ambulatory Visit: Payer: Self-pay | Admitting: Family Medicine

## 2022-03-25 DIAGNOSIS — J432 Centrilobular emphysema: Secondary | ICD-10-CM

## 2022-03-26 NOTE — Telephone Encounter (Signed)
Requested Prescriptions  Pending Prescriptions Disp Refills   albuterol (VENTOLIN HFA) 108 (90 Base) MCG/ACT inhaler [Pharmacy Med Name: ALBUTEROL SULFATE HFA 108 (90 BASE)] 8.5 g 2    Sig: INHALE 2 PUFFS INTO THE LUNGS EVERY 4 HOURS AS NEEDED FOR WHEEZING OR SHORTNESS OF BREATH     Pulmonology:  Beta Agonists 2 Failed - 03/25/2022  9:20 AM      Failed - Last BP in normal range    BP Readings from Last 1 Encounters:  03/01/22 (!) 140/80         Passed - Last Heart Rate in normal range    Pulse Readings from Last 1 Encounters:  09/25/21 84         Passed - Valid encounter within last 12 months    Recent Outpatient Visits           6 months ago Annual physical exam   [place], DO   6 months ago Blepharitis of upper and lower eyelids of both eyes, unspecified type   [facility] [name], DO   10 months ago Centrilobular emphysema [facility])   [facility] [name], DO   1 year ago Centrilobular emphysema [facility])   [facility] [name], DO   1 year ago Annual physical exam   [place], DO       Future Appointments             In 2 days [place], Devonne Doughty, [facility], [facility]

## 2022-03-28 ENCOUNTER — Other Ambulatory Visit: Payer: Self-pay | Admitting: Family Medicine

## 2022-03-28 ENCOUNTER — Encounter: Payer: Self-pay | Admitting: Family Medicine

## 2022-03-28 ENCOUNTER — Ambulatory Visit (INDEPENDENT_AMBULATORY_CARE_PROVIDER_SITE_OTHER): Payer: Medicare Other | Admitting: Family Medicine

## 2022-03-28 VITALS — BP 134/76 | HR 71 | Ht 64.5 in | Wt 164.4 lb

## 2022-03-28 DIAGNOSIS — F5104 Psychophysiologic insomnia: Secondary | ICD-10-CM

## 2022-03-28 DIAGNOSIS — E782 Mixed hyperlipidemia: Secondary | ICD-10-CM

## 2022-03-28 DIAGNOSIS — R6881 Early satiety: Secondary | ICD-10-CM | POA: Diagnosis not present

## 2022-03-28 DIAGNOSIS — I10 Essential (primary) hypertension: Secondary | ICD-10-CM

## 2022-03-28 DIAGNOSIS — F17210 Nicotine dependence, cigarettes, uncomplicated: Secondary | ICD-10-CM | POA: Diagnosis not present

## 2022-03-28 DIAGNOSIS — E039 Hypothyroidism, unspecified: Secondary | ICD-10-CM

## 2022-03-28 DIAGNOSIS — J432 Centrilobular emphysema: Secondary | ICD-10-CM

## 2022-03-28 DIAGNOSIS — R7309 Other abnormal glucose: Secondary | ICD-10-CM

## 2022-03-28 DIAGNOSIS — Z0000 Encounter for general adult medical examination without abnormal findings: Secondary | ICD-10-CM

## 2022-03-28 NOTE — Assessment & Plan Note (Signed)
Controlled on Quetiapine '100mg'$  nightly

## 2022-03-28 NOTE — Patient Instructions (Addendum)
Thank you for coming to the office today.  Keep on the right track. Recommend trying the Chantix if you are ready.  Continue with Candis Musa will contact you next week to continue this.  Refills already sent on Albuterol  Likely if smoking were able to be reduced or quit then you would be able to improve appetite / taste etc.  DUE for FASTING BLOOD WORK (no food or drink after midnight before the lab appointment, only water or coffee without cream/sugar on the morning of)  SCHEDULE "Lab Only" visit in the morning at the clinic for lab draw in 6 MONTHS   - Make sure Lab Only appointment is at about 1 week before your next appointment, so that results will be available  For Lab Results, once available within 2-3 days of blood draw, you can can log in to MyChart online to view your results and a brief explanation. Also, we can discuss results at next follow-up visit.   Please schedule a Follow-up Appointment to: Return in about 6 months (around 09/26/2022) for 6 month fasting lab only then 1 week later Annual Physical.  If you have any other questions or concerns, please feel free to call the office or send a message through [place]. You may also schedule an earlier appointment if necessary.  Additionally, you may be receiving a survey about your experience at our office within a few days to 1 week by e-mail or mail. We value your feedback.  Nobie Putnam, DO [place]

## 2022-03-28 NOTE — Assessment & Plan Note (Signed)
Chronic problem Active smoker, tobacco Failed Symbicort Spiriva  Plan Continue Breztri [place] Albuterol PRN Future consider imaging CXR / LDCT for lung cancer screen and evaluation May refer to Pulmonology if not improving, may benefit from PFTs  Quit smoking advised to start chantix

## 2022-03-28 NOTE — Progress Notes (Signed)
Subjective:    Patient ID: Audrey Campbell, female    DOB: 07-30-1955, 66 y.o.   MRN: [id number]  Audrey Campbell is a 66 y.o. female presenting on 03/28/2022 for COPD and Hypertension   HPI  Centrilobular Emphysema  COPD Tobacco Abuse On Breztri History of COVID 08/2020 Still smoking 0.5 ppd, has not been able to quit smoking Interested in repeat Chantix she has rx but has not started yet. Reordered Albuterol already. And has Breztri from mail order Improved breathing overall   Recurrent Depression, Chronic / Anxiety / Insomnia Stable problems Continues on Quetiapine nightly Off Clonazepam   HTN On Amlodipine 23m daily   Osteoarthritis multiple joints  Early satiety Sometimes reduced intake small amount, she takes Ensure    Health Maintenance: Future Shingles vaccine at pharmacy  Mammogram scheduled for tomorrow 12/1      03/28/2022   10:55 AM 03/01/2022   11:22 AM 09/21/2021    8:45 AM  Depression screen PHQ 2/9  Decreased Interest 1 0 1  Down, Depressed, Hopeless 0 0 0  PHQ - 2 Score 1 0 1  Altered sleeping 0 0 1  Tired, decreased energy 2 0 2  Change in appetite 2 0 0  Feeling bad or failure about yourself  0 0 1  Trouble concentrating 0 0 1  Moving slowly or fidgety/restless 0 0 0  Suicidal thoughts 0 0 0  PHQ-9 Score 5 0 6  Difficult doing work/chores Not difficult at all Not difficult at all Not difficult at all    Social History   Tobacco Use   Smoking status: Every Day    Packs/day: 0.50    Types: Cigarettes   Smokeless tobacco: Never  Vaping Use   Vaping Use: Never used  Substance Use Topics   Alcohol use: No   Drug use: No    Review of Systems Per HPI unless specifically indicated above     Objective:    BP 134/76 (BP Location: Left Arm, Cuff Size: Normal)   Pulse 71   Ht 5' 4.5" (1.638 m)   Wt 164 lb 6.4 oz (74.6 kg)   SpO2 97%   BMI 27.78 kg/m   Wt Readings from Last 3 Encounters:  03/28/22 164 lb 6.4 oz (74.6  kg)  03/01/22 166 lb 12.8 oz (75.7 kg)  09/25/21 171 lb 12.8 oz (77.9 kg)    Physical Exam Vitals and nursing note reviewed.  Constitutional:      General: She is not in acute distress.    Appearance: She is well-developed. She is not diaphoretic.     Comments: Well-appearing, comfortable, cooperative  HENT:     Head: Normocephalic and atraumatic.  Eyes:     General:        Right eye: No discharge.        Left eye: No discharge.     Conjunctiva/sclera: Conjunctivae normal.  Neck:     Thyroid: No thyromegaly.  Cardiovascular:     Rate and Rhythm: Normal rate and regular rhythm.     Heart sounds: Normal heart sounds. No murmur heard. Pulmonary:     Effort: Pulmonary effort is normal. No respiratory distress.     Breath sounds: Normal breath sounds. No wheezing or rales.  Musculoskeletal:        General: Normal range of motion.     Cervical back: Normal range of motion and neck supple.  Lymphadenopathy:     Cervical: No cervical adenopathy.  Skin:  General: Skin is warm and dry.     Findings: No erythema or rash.  Neurological:     Mental Status: She is alert and oriented to person, place, and time.  Psychiatric:        Behavior: Behavior normal.     Comments: Well groomed, good eye contact, normal speech and thoughts       Results for orders placed or performed in visit on 09/25/21  COMPLETE METABOLIC PANEL WITH GFR  Result Value Ref Range   Glucose, Bld 93 65 - 99 mg/dL   BUN 10 7 - 25 mg/dL   Creat 0.75 0.50 - 1.05 mg/dL   eGFR 88 > OR = 60 mL/min/1.60m   BUN/Creatinine Ratio NOT APPLICABLE 6 - 22 (calc)   Sodium 137 135 - 146 mmol/L   Potassium 4.3 3.5 - 5.3 mmol/L   Chloride 100 98 - 110 mmol/L   CO2 27 20 - 32 mmol/L   Calcium 9.6 8.6 - 10.4 mg/dL   Total Protein 6.8 6.1 - 8.1 g/dL   Albumin 4.0 3.6 - 5.1 g/dL   Globulin 2.8 1.9 - 3.7 g/dL (calc)   AG Ratio 1.4 1.0 - 2.5 (calc)   Total Bilirubin 0.4 0.2 - 1.2 mg/dL   Alkaline phosphatase (APISO) 85  37 - 153 U/L   AST 13 10 - 35 U/L   ALT 8 6 - 29 U/L  CBC with Differential/Platelet  Result Value Ref Range   WBC 7.7 3.8 - 10.8 Thousand/uL   RBC 4.43 3.80 - 5.10 Million/uL   Hemoglobin 12.1 11.7 - 15.5 g/dL   HCT 38.1 35.0 - 45.0 %   MCV 86.0 80.0 - 100.0 fL   MCH 27.3 27.0 - 33.0 pg   MCHC 31.8 (L) 32.0 - 36.0 g/dL   RDW 14.5 11.0 - 15.0 %   Platelets 287 140 - 400 Thousand/uL   MPV 9.9 7.5 - 12.5 fL   Neutro Abs 4,535 1,500 - 7,800 cells/uL   Lymphs Abs 2,349 850 - 3,900 cells/uL   Absolute Monocytes 539 200 - 950 cells/uL   Eosinophils Absolute 208 15 - 500 cells/uL   Basophils Absolute 69 0 - 200 cells/uL   Neutrophils Relative % 58.9 %   Total Lymphocyte 30.5 %   Monocytes Relative 7.0 %   Eosinophils Relative 2.7 %   Basophils Relative 0.9 %  Lipid panel  Result Value Ref Range   Cholesterol 181 <200 mg/dL   HDL 55 > OR = 50 mg/dL   Triglycerides 93 <150 mg/dL   LDL Cholesterol (Calc) 107 (H) mg/dL (calc)   Total CHOL/HDL Ratio 3.3 <5.0 (calc)   Non-HDL Cholesterol (Calc) 126 <130 mg/dL (calc)  Hemoglobin A1c  Result Value Ref Range   Hgb A1c MFr Bld 5.3 <5.7 % of total Hgb   Mean Plasma Glucose 105 mg/dL   eAG (mmol/L) 5.8 mmol/L  TSH  Result Value Ref Range   TSH 3.10 0.40 - 4.50 mIU/L  T4, free  Result Value Ref Range   Free T4 1.1 0.8 - 1.8 ng/dL      Assessment & Plan:   Problem List Items Addressed This Visit     Centrilobular emphysema (HCC) - Primary    Chronic problem Active smoker, tobacco Failed Symbicort Spiriva  Plan Continue Breztri [place] PRN Future consider imaging CXR / LDCT for lung cancer screen and evaluation May refer to Pulmonology if not improving, may benefit from PFTs  Quit smoking advised to start chantix  Essential hypertension    Well-controlled HYPERTENSION, improved on repeat  No known complications    Plan:  1.  Continue current BP regimen Amlodipine 48m daily 2. Encourage improved lifestyle -  low sodium diet, regular exercise 3. Continue monitor BP outside office, bring readings to next visit, if persistently >140/90 or new symptoms notify office sooner      Psychophysiological insomnia    Controlled on Quetiapine 1020mnightly      Other Visit Diagnoses     Cigarette nicotine dependence without complication       Early satiety            Continue with BrCandis Musaill contact you next week to continue this.  Refills already sent on Albuterol  Discussed reduced appetite / taste etc, likely from smoking, goal to quit Likely if smoking were able to be reduced or quit then you would be able to improve appetite / taste etc.  No orders of the defined types were placed in this encounter.    Follow up plan: Return in about 6 months (around 09/26/2022) for 6 month fasting lab only then 1 week later Annual Physical.  Future labs ordered for 09/19/22   AlNobie PutnamDOMoss Bluffroup 03/28/2022, 11:06 AM

## 2022-03-28 NOTE — Assessment & Plan Note (Signed)
Well-controlled HYPERTENSION, improved on repeat  No known complications    Plan:  1.  Continue current BP regimen Amlodipine '10mg'$  daily 2. Encourage improved lifestyle - low sodium diet, regular exercise 3. Continue monitor BP outside office, bring readings to next visit, if persistently >140/90 or new symptoms notify office sooner

## 2022-03-29 ENCOUNTER — Ambulatory Visit
Admission: RE | Admit: 2022-03-29 | Discharge: 2022-03-29 | Disposition: A | Discharge status: Home / Self Care | Payer: Medicare Other | Source: Ambulatory Visit | Attending: Family Medicine | Admitting: Family Medicine

## 2022-03-29 DIAGNOSIS — Z1231 Encounter for screening mammogram for malignant neoplasm of breast: Secondary | ICD-10-CM | POA: Insufficient documentation

## 2022-04-03 ENCOUNTER — Telehealth: Payer: Medicare Other

## 2022-04-03 ENCOUNTER — Telehealth: Payer: Self-pay | Admitting: Pharmacist

## 2022-04-03 NOTE — Telephone Encounter (Signed)
   Outreach Note  04/03/2022 Name: TEIGHAN AUBERT MRN: [id number] DOB: Mar 15, 1956  Referred by: [name], DO Reason for referral : No chief complaint on file.   Was unable to reach patient via telephone today and have left HIPAA compliant voicemail asking patient to return my call.    Follow Up Plan: Will attempt to reach patient by telephone again within the next 14 days  Wallace Cullens, PharmD, [facility] [phone]

## 2022-04-11 DIAGNOSIS — M542 Cervicalgia: Secondary | ICD-10-CM | POA: Diagnosis not present

## 2022-04-11 DIAGNOSIS — M25569 Pain in unspecified knee: Secondary | ICD-10-CM | POA: Diagnosis not present

## 2022-04-11 DIAGNOSIS — Z79891 Long term (current) use of opiate analgesic: Secondary | ICD-10-CM | POA: Diagnosis not present

## 2022-04-11 DIAGNOSIS — M25519 Pain in unspecified shoulder: Secondary | ICD-10-CM | POA: Diagnosis not present

## 2022-04-11 DIAGNOSIS — M722 Plantar fascial fibromatosis: Secondary | ICD-10-CM | POA: Diagnosis not present

## 2022-04-11 DIAGNOSIS — M545 Low back pain, unspecified: Secondary | ICD-10-CM | POA: Diagnosis not present

## 2022-04-11 DIAGNOSIS — G894 Chronic pain syndrome: Secondary | ICD-10-CM | POA: Diagnosis not present

## 2022-04-17 ENCOUNTER — Ambulatory Visit: Payer: Medicare Other | Admitting: Pharmacist

## 2022-04-17 ENCOUNTER — Telehealth: Payer: Self-pay | Admitting: Pharmacy Technician

## 2022-04-17 ENCOUNTER — Ambulatory Visit: Payer: Medicare Other

## 2022-04-17 DIAGNOSIS — Z596 Low income: Secondary | ICD-10-CM

## 2022-04-17 DIAGNOSIS — J432 Centrilobular emphysema: Secondary | ICD-10-CM

## 2022-04-17 NOTE — Addendum Note (Signed)
Addended by: Wallace Cullens A on: 04/17/2022 02:00 PM   Modules accepted: Orders, Level of Service

## 2022-04-17 NOTE — Patient Instructions (Signed)
Goals Addressed             This Visit's Progress    Pharmacy Goals       Please check your home blood pressure, keep a log of the results and bring this with you to your medical appointments  Please consider using a weekly pillbox for organizing your medications  Please consider calling the [place] Quitline again.  The [place] Quitline phone number is: [phone]  Feel free to call me with any questions or concerns. I look forward to our next call!  Laila Myhre Katessa Attridge, PharmD, BCACP, CPP Clinical Pharmacist [facility] [place] [phone]         

## 2022-04-17 NOTE — Progress Notes (Signed)
This encounter was created in error - please disregard.

## 2022-04-17 NOTE — Progress Notes (Signed)
[facility])                                            [place] Team    04/17/2022  Audrey Campbell 09-Feb-1956 [id number]                                      Medication Assistance Referral-FOR 2024 RE ENROLLMENT  Referral From: [facility] Embedded RPh Dorthula Perfect   Medication/Company: Judithann Sauger / [place] Patient application portion:  Mailed Provider application portion: Faxed  to Dr. Nobie Putnam Provider address/fax verified via: Office website    Garrett Bowring P. Lebron Nauert, [place]  [phone]

## 2022-04-17 NOTE — Chronic Care Management (AMB) (Deleted)
04/17/2022 Name: Audrey Campbell MRN: [id number] DOB: [birth year]  Chief Complaint  Patient presents with   Medication Assistance    Audrey Campbell is a 66 y.o. year old female who presented for a telephone visit.   They were referred to the pharmacist by their PCP for assistance in managing medication access.    Subjective:  Care Team: Primary Care Provider: [name], DO ; Next Scheduled Visit: 09/26/2022  Medication Access/Adherence  Current Pharmacy:  [facility], [place] [address] [place] [address] Phone: [phone] Fax: [phone]  [place], [place]. [place]. [place] [address] Phone: [phone] Fax: [phone]   Patient reports affordability concerns with their medications: No  Patient reports access/transportation concerns to their pharmacy: No  Patient reports adherence concerns with their medications:  No     COPD:  Current medications: Breztri inhaler - 2 puffs into lungs twice daily Albuterol nebulizer solution/albuterol inhaler as needed  Confirms using Breztri inhaler - 2 puffs each morning and 2 puffs every evening as directed.   Current medication access support: enrolled in patient assistance for Breztri through [place] through 04/28/2022    Tobacco Abuse: Reports currently smoking ~1/2 pack/day Previous therapies tried: gum, lozenge - did not like the taste, nicotine patches, Chantix Motivation: "I want to breathe more than I want to smoke"; bad taste of cigarettes Triggers: wanting to hold cigarette; being around others who are smoking Reports previous successful quit attempt this year with Chantix Patient not interested in setting quit date today    Objective: Lab Results  Component Value Date   HGBA1C 5.3 09/25/2021    Lab Results  Component Value Date    CREATININE 0.75 09/25/2021   BUN 10 09/25/2021   NA 137 09/25/2021   K 4.3 09/25/2021   CL 100 09/25/2021   CO2 27 09/25/2021   BP Readings from Last 3 Encounters:  03/28/22 134/76  03/01/22 (!) 140/80  09/25/21 130/80   Pulse Readings from Last 3 Encounters:  03/28/22 71  09/25/21 84  09/21/21 80     Medications    Reviewed by [name], DO (Physician) on 03/28/22 at 8  Med List Status: <None>   Medication Order Taking? Sig Documenting Provider Last Dose Status Informant  albuterol (ACCUNEB) 0.63 MG/3ML nebulizer solution [id number] Yes Take 1 ampule by nebulization every 6 (six) hours as needed for wheezing. [provider] Taking Active   albuterol (VENTOLIN HFA) 108 (90 Base) MCG/ACT inhaler [id number] Yes INHALE 2 PUFFS INTO THE LUNGS EVERY 4 HOURS AS NEEDED FOR WHEEZING OR SHORTNESS OF BREATH Karamalegos, Devonne Doughty, DO Taking Active   BREZTRI AEROSPHERE 160-9-4.8 MCG/ACT Hollie Salk [id number] Yes INHALE 2 PUFFS BY MOUTH INTO THE LUNGS IN THE MORNING AND AT BEDTIME Kathrine Haddock, NP Taking Active   butalbital-acetaminophen-caffeine (FIORICET) 50-325-40 MG tablet [id number] No Take 1 tablet by mouth every 6 (six) hours as needed for headache.  Patient not taking: Reported on 03/01/2022   Jearld Fenton, NP Not Taking Active   diclofenac Sodium (VOLTAREN) 1 % GEL [id number] Yes Apply topically daily as needed. [provider] Taking Active   fluticasone (FLONASE) 50 MCG/ACT nasal spray [id number] Yes Place 2 sprays into both nostrils daily. [name], DO Taking Active   gabapentin (NEURONTIN) 400 MG capsule [id number] Yes Take 1 capsule (400 mg total) by mouth 4 (four)  times daily. [name], DO Taking Active   hydrOXYzine (VISTARIL) 25 MG capsule [id number] Yes Take 1 capsule by mouth daily as needed. [provider] Taking Active   meloxicam (MOBIC) 15 MG tablet [id number] No Take 1 tablet (15 mg total) by mouth  daily as needed for pain.  Patient not taking: Reported on 03/01/2022   [name], DO Not Taking Active   montelukast (SINGULAIR) 10 MG tablet [id number] Yes Take 1 tablet (10 mg total) by mouth daily. [name], DO Taking Active   Oxycodone HCl 10 MG TABS [id number] Yes Take 10 mg by mouth 4 (four) times daily as needed. [provider] Taking Active   polyethylene glycol (MIRALAX / GLYCOLAX) 17 g packet [id number] Yes Take 17 g by mouth daily as needed. [provider] Taking Active   QUEtiapine (SEROQUEL) 100 MG tablet [id number] Yes TAKE 1 AND 1/2 TABLETS BY MOUTH AT BEDTIME [name], DO Taking Active               Assessment/Plan:   Patient declines to review medications at this time as she is not currently home  COPD: - Reviewed importance of rinsing mouth out after each use of Breztri - Meets financial criteria for [employer] patient assistance program through [place]. Will collaborate with provider, CPhT, and patient to pursue assistance.   Tobacco Abuse: - Discuss benefits of smoking cessation - Patient not ready to set quit date today, but agrees to contact provider/clinical pharmacist if needed for support  Follow Up Plan: Clinical Pharmacist will follow up with patient by telephone on 06/17/2022 at 1 pm  Wallace Cullens, PharmD, [place], [facility] [phone]

## 2022-04-17 NOTE — Patient Instructions (Signed)
Goals Addressed             This Visit's Progress    Pharmacy Goals       Please check your home blood pressure, keep a log of the results and bring this with you to your medical appointments  Please consider using a weekly pillbox for organizing your medications  Please consider calling the [place] Quitline again.  The [place] Quitline phone number is: [phone]  Feel free to call me with any questions or concerns. I look forward to our next call!  Helayne Metsker Bubba Vanbenschoten, PharmD, BCACP, CPP Clinical Pharmacist [facility] [place] [phone]         

## 2022-04-17 NOTE — Chronic Care Management (AMB) (Signed)
04/17/2022 Name: Audrey Campbell MRN: [id number] DOB: 05-16-1955  Chief Complaint  Patient presents with   Medication Assistance    Audrey Campbell is a 66 y.o. year old female who presented for a telephone visit.   They were referred to the pharmacist by their PCP for assistance in managing medication access.    Subjective:  Care Team: Primary Care Provider: [name], DO ; Next Scheduled Visit: 09/26/2022  Medication Access/Adherence  Current Pharmacy:  [facility], [place] [address] [place] [address] Phone: [phone] Fax: [phone]  [place], [place]. [place]. [place] [address] Phone: [phone] Fax: [phone]   Patient reports affordability concerns with their medications: No  Patient reports access/transportation concerns to their pharmacy: No  Patient reports adherence concerns with their medications:  No     COPD:  Current medications: Breztri inhaler - 2 puffs into lungs twice daily Albuterol nebulizer solution/albuterol inhaler as needed  Confirms using Breztri inhaler - 2 puffs each morning and 2 puffs every evening as directed.   Current medication access support: enrolled in patient assistance for Breztri through [place] through 04/28/2022    Tobacco Abuse: Reports currently smoking ~1/2 pack/day Previous therapies tried: gum, lozenge - did not like the taste, nicotine patches, Chantix Motivation: "I want to breathe more than I want to smoke"; bad taste of cigarettes Triggers: wanting to hold cigarette; being around others who are smoking Reports previous successful quit attempt this year with Chantix Patient not interested in setting quit date today    Health Maintenance  Health Maintenance Due  Topic Date Due   DTaP/Tdap/Td (1 - Tdap) Never done   Zoster Vaccines-  Shingrix (1 of 2) Never done   DEXA SCAN  Never done   COVID-19 Vaccine (6 - 2023-24 season) 03/24/2022     Objective: Lab Results  Component Value Date   HGBA1C 5.3 09/25/2021    Lab Results  Component Value Date   CREATININE 0.75 09/25/2021   BUN 10 09/25/2021   NA 137 09/25/2021   K 4.3 09/25/2021   CL 100 09/25/2021   CO2 27 09/25/2021   BP Readings from Last 3 Encounters:  03/28/22 134/76  03/01/22 (!) 140/80  09/25/21 130/80   Pulse Readings from Last 3 Encounters:  03/28/22 71  09/25/21 84  09/21/21 80      Medications    Reviewed by [name], DO (Physician) on 03/28/22 at 1334  Med List Status: <None>   Medication Order Taking? Sig Documenting Provider Last Dose Status Informant  albuterol (ACCUNEB) 0.63 MG/3ML nebulizer solution [id number] Yes Take 1 ampule by nebulization every 6 (six) hours as needed for wheezing. [provider] Taking Active   albuterol (VENTOLIN HFA) 108 (90 Base) MCG/ACT inhaler [id number] Yes INHALE 2 PUFFS INTO THE LUNGS EVERY 4 HOURS AS NEEDED FOR WHEEZING OR SHORTNESS OF BREATH Karamalegos, Devonne Doughty, DO Taking Active   BREZTRI AEROSPHERE 160-9-4.8 MCG/ACT Hollie Salk [id number] Yes INHALE 2 PUFFS BY MOUTH INTO THE LUNGS IN THE MORNING AND AT BEDTIME Kathrine Haddock, NP Taking Active   butalbital-acetaminophen-caffeine (FIORICET) 50-325-40 MG tablet [id number] No Take 1 tablet by mouth every 6 (six) hours as needed for headache.  Patient not taking: Reported on 03/01/2022   Jearld Fenton, NP Not Taking Active   diclofenac Sodium (VOLTAREN) 1 % GEL [id number] Yes Apply topically daily  as needed. [provider] Taking Active   fluticasone (FLONASE) 50 MCG/ACT nasal spray [id number] Yes Place 2 sprays into both nostrils daily. [name], DO Taking Active   gabapentin (NEURONTIN) 400 MG capsule [id number] Yes Take 1 capsule (400 mg total) by mouth 4 (four) times daily. [name], DO  Taking Active   hydrOXYzine (VISTARIL) 25 MG capsule [id number] Yes Take 1 capsule by mouth daily as needed. [provider] Taking Active   meloxicam (MOBIC) 15 MG tablet [id number] No Take 1 tablet (15 mg total) by mouth daily as needed for pain.  Patient not taking: Reported on 03/01/2022   [name], DO Not Taking Active   montelukast (SINGULAIR) 10 MG tablet [id number] Yes Take 1 tablet (10 mg total) by mouth daily. [name], DO Taking Active   Oxycodone HCl 10 MG TABS [id number] Yes Take 10 mg by mouth 4 (four) times daily as needed. [provider] Taking Active   polyethylene glycol (MIRALAX / GLYCOLAX) 17 g packet [id number] Yes Take 17 g by mouth daily as needed. [provider] Taking Active   QUEtiapine (SEROQUEL) 100 MG tablet [id number] Yes TAKE 1 AND 1/2 TABLETS BY MOUTH AT BEDTIME [name], DO Taking Active               Assessment/Plan:   Patient declines to review medications at this time as she is not currently home  COPD: - Reviewed importance of rinsing mouth out after each use of Breztri - Meets financial criteria for [employer] patient assistance program through [place]. Will collaborate with provider, CPhT, and patient to pursue assistance.   Tobacco Abuse: - Discuss benefits of smoking cessation - Patient not ready to set quit date today, but agrees to contact provider/clinical pharmacist if needed for support  Follow Up Plan: Clinical Pharmacist will follow up with patient by telephone on 06/17/2022 at 1 pm  Wallace Cullens, PharmD, [place], [facility] [phone]

## 2022-04-18 DIAGNOSIS — J449 Chronic obstructive pulmonary disease, unspecified: Secondary | ICD-10-CM | POA: Diagnosis not present

## 2022-05-03 ENCOUNTER — Telehealth: Payer: Self-pay | Admitting: Pharmacy Technician

## 2022-05-03 DIAGNOSIS — Z596 Low income: Secondary | ICD-10-CM

## 2022-05-03 NOTE — Progress Notes (Signed)
[facility])                                            [place] Team    05/03/2022  KIMBERLEY SPEECE February 21, 1956 [id number]  Received both patient and provider portion(s) of patient assistance application(s) for [facility]. Faxed completed application and required documents into [place]ME.    Erskine Steinfeldt P. Bronda Alfred, [place]  [phone]

## 2022-05-13 IMAGING — MG DIGITAL SCREENING BILAT W/ TOMO W/ CAD
6 of 10 series · 6 of 30 positions shown · non-contrast
Comparison: Previous exam(s).

CLINICAL DATA: Screening.

EXAM:
DIGITAL SCREENING BILATERAL MAMMOGRAM WITH TOMO AND CAD

[R MLO synth-2D]
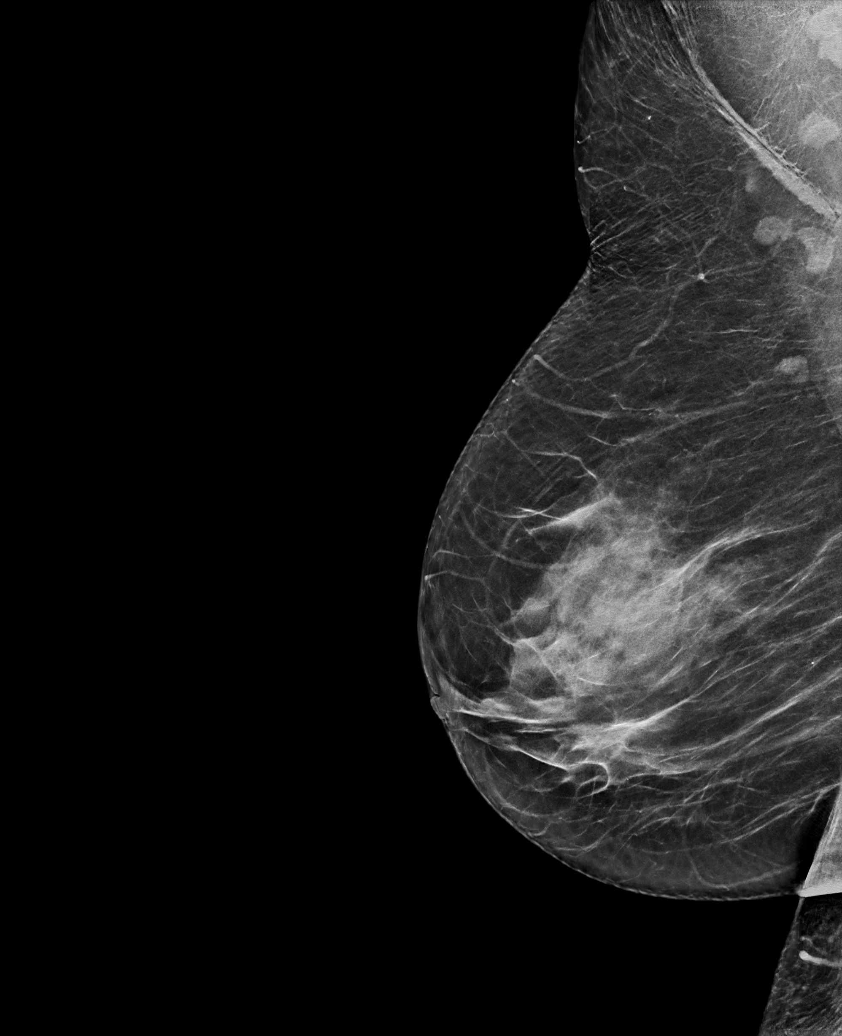

[R AT synth-2D]
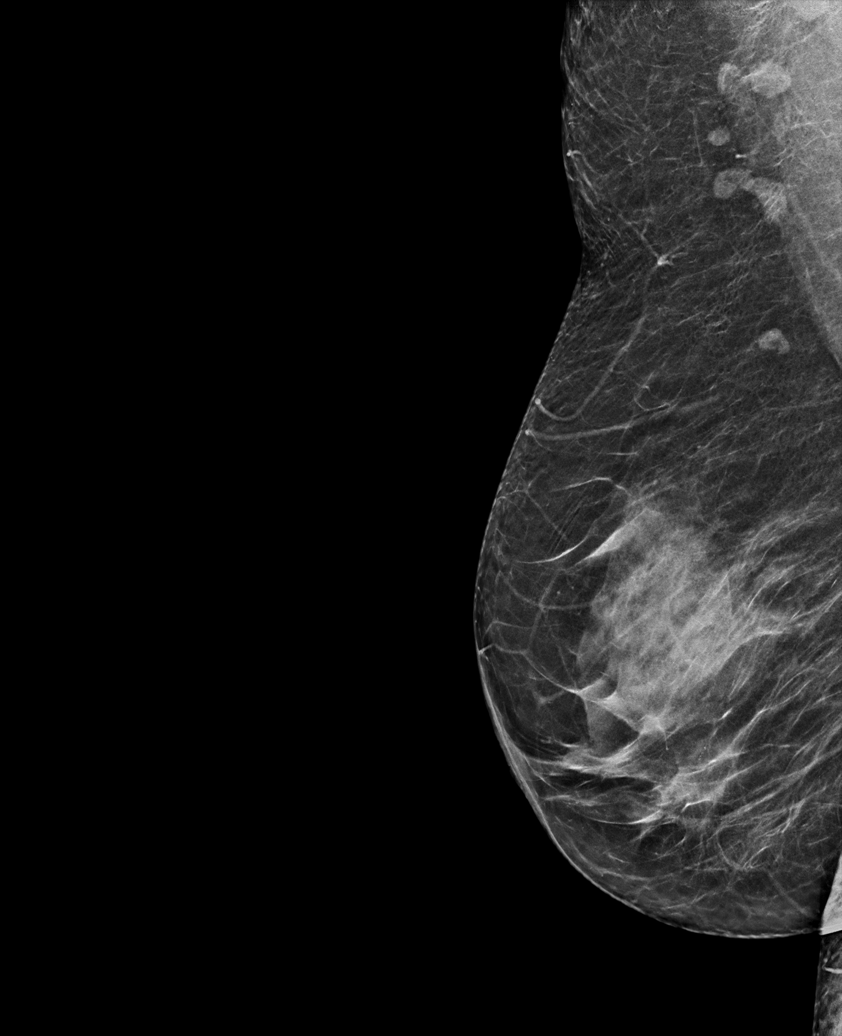

[L CC synth-2D]
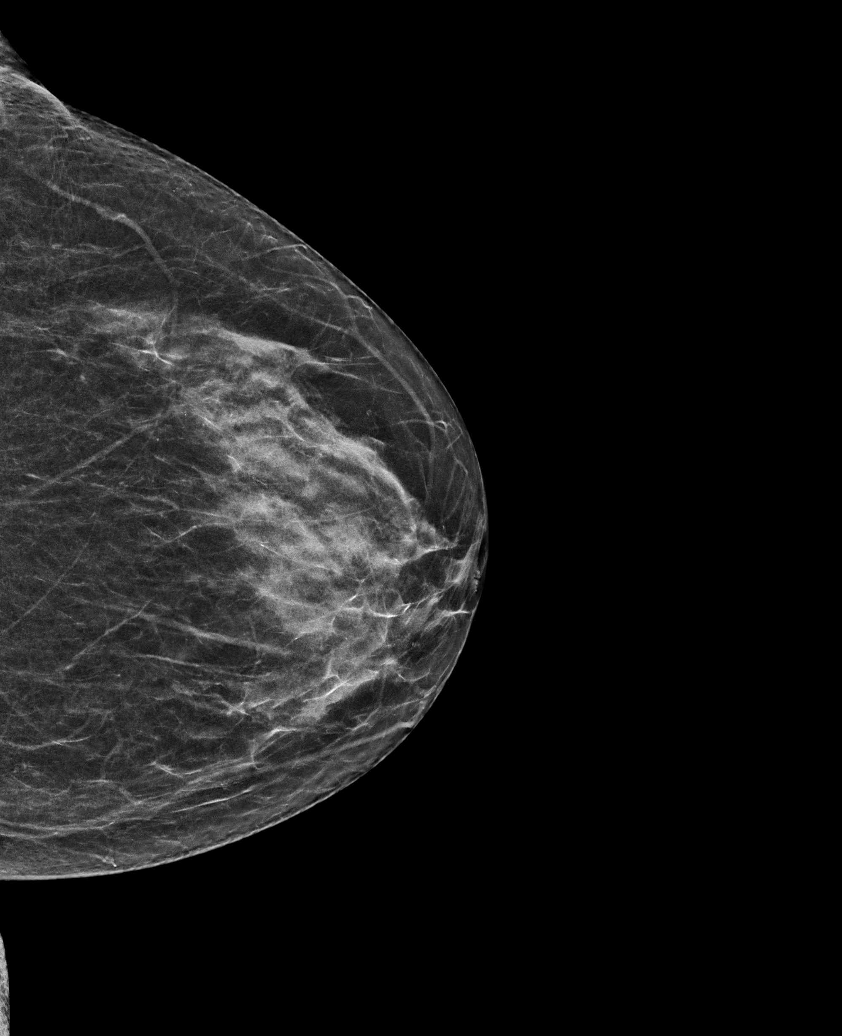

[L MLO synth-2D]
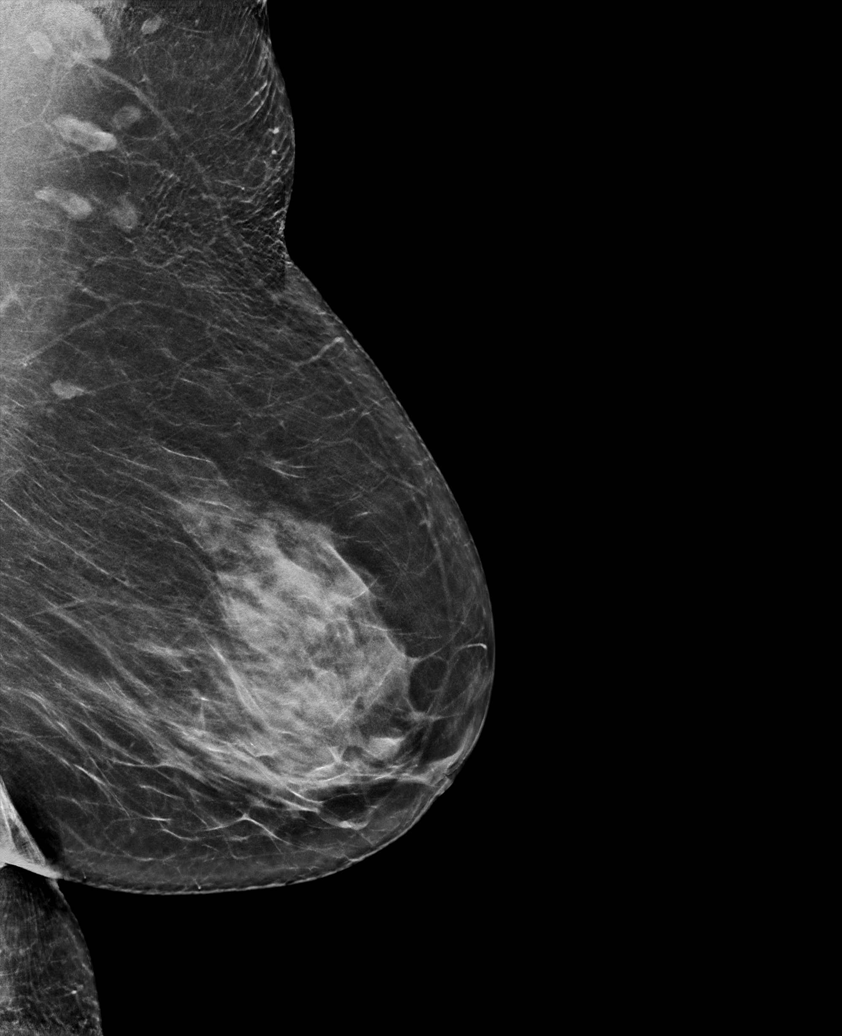

[R CC synth-2D]
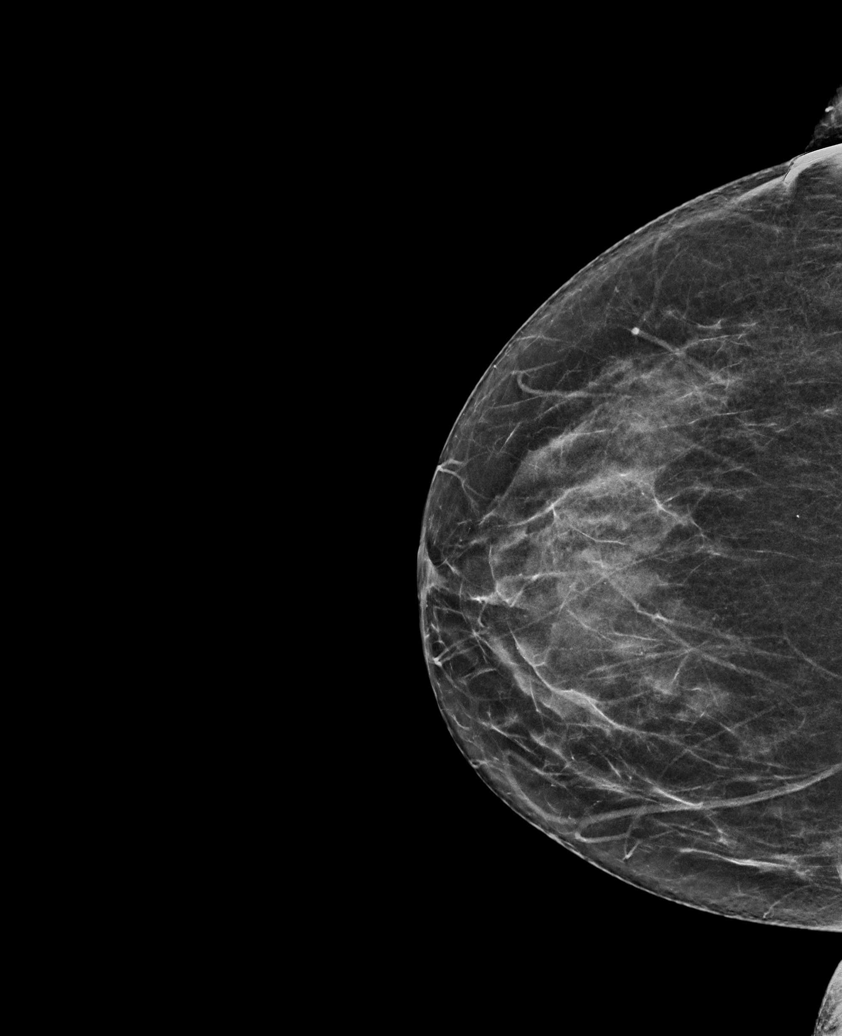

[L CC tomo · tomo slice 27/53.0]
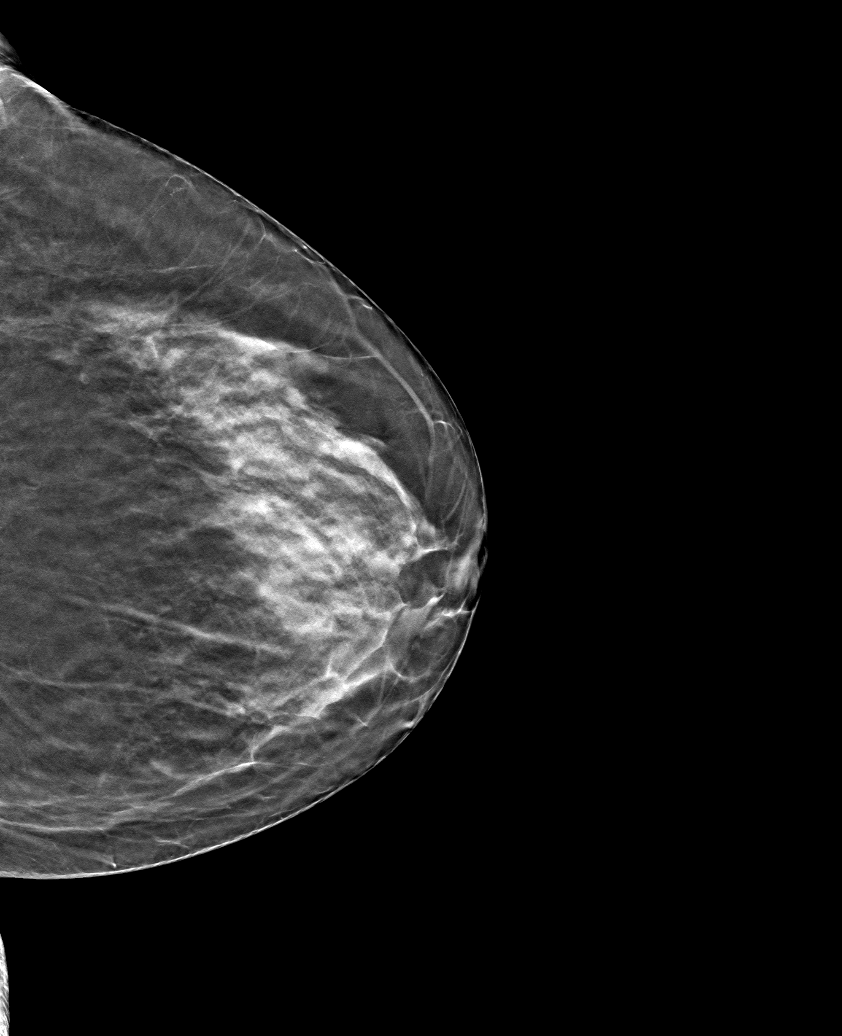

[6 of 30 positions shown; findings below may reference images not displayed]

ACR Breast Density Category c: The breast tissue is heterogeneously
dense, which may obscure small masses.
FINDINGS: There are no findings suspicious for malignancy. Images were
processed with CAD.
IMPRESSION: No mammographic evidence of malignancy. A result letter of this
screening mammogram will be mailed directly to the patient.

RECOMMENDATION:
Screening mammogram in one year. (Code:[id number])

BI-RADS CATEGORY  1: Negative.

## 2022-05-19 DIAGNOSIS — J449 Chronic obstructive pulmonary disease, unspecified: Secondary | ICD-10-CM | POA: Diagnosis not present

## 2022-05-20 ENCOUNTER — Telehealth: Payer: Self-pay | Admitting: Pharmacy Technician

## 2022-05-20 DIAGNOSIS — Z596 Low income: Secondary | ICD-10-CM

## 2022-05-20 NOTE — Progress Notes (Signed)
[place])                                            [place] Team    05/20/2022  DARLYNE SCHMIESING [birth year] [id number]  Care coordination call placed to [employer] in regard to [facility] application.   Spoke to [place] who informs patient is APPROVED 04/29/22-04/29/23. Order will auto fill and ship to patient's home based on last fill date in 2023/2024 Patient may call [employer] at [phone] if shipment has not arrived and patient does not have sufficient supply.  Nastasia Kage P. Makoa Satz, [place]  [phone]

## 2022-06-06 DIAGNOSIS — M542 Cervicalgia: Secondary | ICD-10-CM | POA: Diagnosis not present

## 2022-06-06 DIAGNOSIS — G894 Chronic pain syndrome: Secondary | ICD-10-CM | POA: Diagnosis not present

## 2022-06-06 DIAGNOSIS — Z79891 Long term (current) use of opiate analgesic: Secondary | ICD-10-CM | POA: Diagnosis not present

## 2022-06-06 DIAGNOSIS — M722 Plantar fascial fibromatosis: Secondary | ICD-10-CM | POA: Diagnosis not present

## 2022-06-17 ENCOUNTER — Telehealth: Payer: Self-pay | Admitting: Pharmacist

## 2022-06-17 ENCOUNTER — Ambulatory Visit: Payer: Medicare Other | Admitting: Pharmacist

## 2022-06-17 DIAGNOSIS — R519 Headache, unspecified: Secondary | ICD-10-CM

## 2022-06-17 DIAGNOSIS — J432 Centrilobular emphysema: Secondary | ICD-10-CM

## 2022-06-17 MED ORDER — BUTALBITAL-APAP-CAFFEINE 50-325-40 MG PO TABS: 1.0000 | ORAL_TABLET | Freq: Four times a day (QID) | ORAL | 2 refills | Status: DC | PRN

## 2022-06-17 NOTE — Telephone Encounter (Signed)
Refill ordered  Nobie Putnam, [facility] 06/17/2022, 2:17 PM

## 2022-06-17 NOTE — Progress Notes (Signed)
06/17/2022 Name: LAKARA SEAVERS MRN: [id number] DOB: [birth year]  Chief Complaint  Patient presents with   Medication Assistance    CORRENE KOEHN is a 67 y.o. year old female who presented for a telephone visit.   They were referred to the pharmacist by their PCP for assistance in managing medication access.    Subjective:  Care Team: Primary Care Provider: [name], DO ; Next Scheduled Visit: 09/26/2022  Medication Access/Adherence  Current Pharmacy:  [place], [place] [address] [place] [address] Phone: [phone] Fax: [phone]  [place], [place] [place] [place] [address] Phone: [phone] Fax: [phone]   Patient reports affordability concerns with their medications: No  Patient reports access/transportation concerns to their pharmacy: No  Patient reports adherence concerns with their medications:  No     Patient requests assistance with requesting renewal of her butalbital-acetaminophen-caffeine (FIORICET) 50-325-40 MG from PCP - Send message to provider/clinical team   COPD:   Current medications: Breztri inhaler - 2 puffs into lungs twice daily Albuterol nebulizer solution/albuterol inhaler as needed   Confirms using Breztri inhaler - 2 puffs each morning and 2 puffs every evening as directed and rinsing out her mouth after each use   Current medication access support:  Collaborated with PCP and [facility] CPhT for aid to patient with re-enrollment in [place] patient assistance program through [employer] - From review of chart, note [facility] CPhT contacted [employer] on 1/22 and was advised patient approved for re-enrollment through 04/29/2023 - Today patient reports received 6 inhalers from assistance program     Tobacco Abuse: Reports currently smoking ~1/2 pack/day Previous therapies tried:  gum, lozenge - did not like the taste, nicotine patches, Chantix Motivation: "I want to breathe more than I want to smoke"; bad taste of cigarettes Triggers: wanting to hold cigarette; being around others who are smoking Reported previous successful quit attempt this year with Chantix Patient not interested in setting quit date today, but says that she is working toward this goal   Objective:   Lab Results  Component Value Date   CREATININE 0.75 09/25/2021   BUN 10 09/25/2021   NA 137 09/25/2021   K 4.3 09/25/2021   CL 100 09/25/2021   CO2 27 09/25/2021    Lab Results  Component Value Date   CHOL 181 09/25/2021   HDL 55 09/25/2021   LDLCALC 107 (H) 09/25/2021   TRIG 93 09/25/2021   CHOLHDL 3.3 09/25/2021     Medications Reviewed Today     Reviewed by Rennis Petty, RPH-CPP (Pharmacist) on 06/17/22 at 1314  Med List Status: <None>   Medication Order Taking? Sig Documenting Provider Last Dose Status Informant  albuterol (ACCUNEB) 0.63 MG/3ML nebulizer solution [id number] Yes Take 1 ampule by nebulization every 6 (six) hours as needed for wheezing. [provider] Taking Active   albuterol (VENTOLIN HFA) 108 (90 Base) MCG/ACT inhaler [id number]  INHALE 2 PUFFS INTO THE LUNGS EVERY 4 HOURS AS NEEDED FOR WHEEZING OR SHORTNESS OF BREATH [name], DO  Active   BREZTRI AEROSPHERE 160-9-4.8 MCG/ACT AERO [id number] Yes INHALE 2 PUFFS BY MOUTH INTO THE LUNGS IN THE MORNING AND AT BEDTIME Kathrine Haddock, NP Taking Active   butalbital-acetaminophen-caffeine (FIORICET) 50-325-40 MG tablet [id number]  Take 1 tablet by mouth every 6 (six) hours as needed for headache.  Patient not taking: Reported  on 03/01/2022   Jearld Fenton, NP  Active   diclofenac Sodium (VOLTAREN) 1 % GEL [id number]  Apply topically daily as needed. [provider]  Active   fluticasone (FLONASE) 50 MCG/ACT nasal spray [id number]  Place 2 sprays into both nostrils daily. Karamalegos,  Devonne Doughty, DO  Active   gabapentin (NEURONTIN) 400 MG capsule [id number]  Take 1 capsule (400 mg total) by mouth 4 (four) times daily. Karamalegos, Devonne Doughty, DO  Active   hydrOXYzine (VISTARIL) 25 MG capsule [id number]  Take 1 capsule by mouth daily as needed. [provider]  Active   meloxicam (MOBIC) 15 MG tablet [id number]  Take 1 tablet (15 mg total) by mouth daily as needed for pain.  Patient not taking: Reported on 03/01/2022   [name], DO  Active   montelukast (SINGULAIR) 10 MG tablet [id number]  Take 1 tablet (10 mg total) by mouth daily. [name], DO  Active   Oxycodone HCl 10 MG TABS [id number]  Take 10 mg by mouth 4 (four) times daily as needed. [provider]  Active   polyethylene glycol (MIRALAX / GLYCOLAX) 17 g packet [id number]  Take 17 g by mouth daily as needed. [provider]  Active   QUEtiapine (SEROQUEL) 100 MG tablet [id number]  TAKE 1 AND 1/2 TABLETS BY MOUTH AT BEDTIME Karamalegos, Devonne Doughty, DO  Active               Assessment/Plan:   COPD: - Reviewed importance of rinsing mouth out after each use of Breztri - Patient aware of process for ordering refills of her Breztri inhaler from patient assistance program through [employer].   Tobacco Abuse: - Discuss benefits of smoking cessation - Patient not ready to set quit date today, but agrees to contact provider/clinical pharmacist if needed for support   Follow Up Plan: Clinical Pharmacist will follow up with patient by telephone on 03/17/2023 at 1 pm   Wallace Cullens, PharmD, [place], [facility] [phone]

## 2022-06-17 NOTE — Patient Instructions (Signed)
Goals Addressed             This Visit's Progress    Pharmacy Goals       Please check your home blood pressure, keep a log of the results and bring this with you to your medical appointments  Please consider using a weekly pillbox for organizing your medications  Please consider calling the [place] Quitline again.  The [place] Quitline phone number is: [phone]  Feel free to call me with any questions or concerns. I look forward to our next call!  Wallace Cullens, PharmD, [place], CPP Clinical Pharmacist [facility] [phone]

## 2022-06-17 NOTE — Telephone Encounter (Signed)
Patient requesting renewal of her butalbital-acetaminophen-caffeine (FIORICET) 50-325-40 MG as current Rx out of refills. Would you please consider sending new Rx to [facility]?  Thank you!  Grayland Ormond

## 2022-06-26 DIAGNOSIS — J449 Chronic obstructive pulmonary disease, unspecified: Secondary | ICD-10-CM | POA: Diagnosis not present

## 2022-07-17 ENCOUNTER — Other Ambulatory Visit: Payer: Self-pay | Admitting: Family Medicine

## 2022-07-17 DIAGNOSIS — J432 Centrilobular emphysema: Secondary | ICD-10-CM

## 2022-07-18 NOTE — Telephone Encounter (Signed)
Requested Prescriptions  Pending Prescriptions Disp Refills   albuterol (VENTOLIN HFA) 108 (90 Base) MCG/ACT inhaler [Pharmacy Med Name: ALBUTEROL SULFATE HFA 108 (90 BASE)] 8.5 g 0    Sig: INHALE 2 PUFFS INTO THE LUNGS EVERY 4 HOURS AS NEEDED FOR WHEEZING OR SHORTNESS OF BREATH     Pulmonology:  Beta Agonists 2 Passed - 07/17/2022 10:19 AM      Passed - Last BP in normal range    BP Readings from Last 1 Encounters:  03/28/22 134/76         Passed - Last Heart Rate in normal range    Pulse Readings from Last 1 Encounters:  03/28/22 71         Passed - Valid encounter within last 12 months    Recent Outpatient Visits           1 month ago Centrilobular emphysema ([place])   [facility] Delles, Grayland Ormond A, RPH-CPP   3 months ago Centrilobular emphysema [facility])   [place], Grayland Ormond A, RPH-CPP   3 months ago Centrilobular emphysema [facility])   [facility] [name], DO   9 months ago Annual physical exam   [facility] [place], Devonne Doughty, DO   10 months ago Blepharitis of upper and lower eyelids of both eyes, unspecified type   [place], DO       Future Appointments             In 2 months [place], Devonne Doughty, [facility], [facility]

## 2022-07-22 ENCOUNTER — Other Ambulatory Visit: Payer: Self-pay | Admitting: Family Medicine

## 2022-07-22 DIAGNOSIS — F17210 Nicotine dependence, cigarettes, uncomplicated: Secondary | ICD-10-CM

## 2022-07-23 ENCOUNTER — Telehealth: Payer: Self-pay | Admitting: Family Medicine

## 2022-07-23 DIAGNOSIS — F17210 Nicotine dependence, cigarettes, uncomplicated: Secondary | ICD-10-CM

## 2022-07-23 NOTE — Telephone Encounter (Signed)
Requested medication (s) are due for refill today: review  Requested medication (s) are on the active medication list: no  Last refill:  09/25/21  Future visit scheduled: yes  Notes to clinic:  rx was discontinued on 03/01/2022 by Dionisio David, LPN  please advise for refill      Requested Prescriptions  Pending Prescriptions Disp Refills   Varenicline Tartrate, Starter, 0.5 MG X 11 & 1 MG X 42 TBPK [Pharmacy Med Name: VARENICLINE TARTRATE 0.5 MG X 11 &] 53 each     Sig: TAKE 1 TAB (0.5 MG) DAILY FOR 3 DAYS, INCREASE TO 1 TAB (0.5 MG) TWICE DAILY FOR 4 DAYS THEN INCREASE TO 1 TAB (1 MG)TWICE DAILY     Psychiatry:  Drug Dependence Therapy - varenicline Failed - 07/22/2022  9:54 AM      Failed - Manual Review: Do not refill starter pack. 1 mg tabs may be extended up to one year if the patient has quit smoking but still feels at risk for relapse.      Failed - Cr in normal range and within 180 days    Creat  Date Value Ref Range Status  09/25/2021 0.75 0.50 - 1.05 mg/dL Final         Passed - Completed PHQ-2 or PHQ-9 in the last 360 days      Passed - Valid encounter within last 6 months    Recent Outpatient Visits           1 month ago Centrilobular emphysema [facility])   [place], Grayland Ormond A, RPH-CPP   3 months ago Centrilobular emphysema [facility])   [place], Grayland Ormond A, RPH-CPP   3 months ago Centrilobular emphysema [facility])   [place], DO   10 months ago Annual physical exam   [facility] [name], DO   10 months ago Blepharitis of upper and lower eyelids of both eyes, unspecified type   [place], DO       Future Appointments             In 2 months [place], Devonne Doughty, [facility], [facility]

## 2022-07-23 NOTE — Telephone Encounter (Signed)
This request requires new Rx- can not be refilled.

## 2022-07-23 NOTE — Telephone Encounter (Signed)
Medication Refill - Medication: varenicline (CHANTIX PAK) 0.5 MG X 11 & 1 MG X 42 tablet   Has the patient contacted their pharmacy? No. Patient stated she still uses the medication and need it refilled but did not call pharmacy  Preferred Pharmacy (with phone number or street name):  [place], [place] Phone: [phone]  Fax: [phone]     Has the patient been seen for an appointment in the last year OR does the patient have an upcoming appointment? No.

## 2022-07-24 MED ORDER — VARENICLINE TARTRATE 1 MG PO TABS: 1.0000 mg | ORAL_TABLET | Freq: Two times a day (BID) | ORAL | 0 refills | Status: DC

## 2022-08-02 DIAGNOSIS — J449 Chronic obstructive pulmonary disease, unspecified: Secondary | ICD-10-CM | POA: Diagnosis not present

## 2022-08-15 DIAGNOSIS — Z79891 Long term (current) use of opiate analgesic: Secondary | ICD-10-CM | POA: Diagnosis not present

## 2022-08-15 DIAGNOSIS — M25569 Pain in unspecified knee: Secondary | ICD-10-CM | POA: Diagnosis not present

## 2022-08-15 DIAGNOSIS — M722 Plantar fascial fibromatosis: Secondary | ICD-10-CM | POA: Diagnosis not present

## 2022-08-15 DIAGNOSIS — G894 Chronic pain syndrome: Secondary | ICD-10-CM | POA: Diagnosis not present

## 2022-08-15 DIAGNOSIS — M545 Low back pain, unspecified: Secondary | ICD-10-CM | POA: Diagnosis not present

## 2022-08-16 ENCOUNTER — Other Ambulatory Visit: Payer: Self-pay | Admitting: Family Medicine

## 2022-08-16 DIAGNOSIS — J432 Centrilobular emphysema: Secondary | ICD-10-CM

## 2022-08-16 DIAGNOSIS — F5104 Psychophysiologic insomnia: Secondary | ICD-10-CM

## 2022-08-16 NOTE — Telephone Encounter (Signed)
Albuterol inhaler already refilled today in another encounter. Sent request for seroquel to PCP for approval, pending request.

## 2022-08-16 NOTE — Telephone Encounter (Signed)
Requested Prescriptions  Pending Prescriptions Disp Refills   QUEtiapine (SEROQUEL) 100 MG tablet [Pharmacy Med Name: QUETIAPINE FUMARATE 100 MG TAB] 135 tablet 1    Sig: TAKE 1 AND 1/2 TABLETS BY MOUTH AT BEDTIME     Not Delegated - Psychiatry:  Antipsychotics - Second Generation (Atypical) - quetiapine Failed - 08/16/2022  9:28 AM      Failed - This refill cannot be delegated      Failed - Lipid Panel in normal range within the last 12 months    Cholesterol  Date Value Ref Range Status  09/25/2021 181 <200 mg/dL Final   LDL Cholesterol (Calc)  Date Value Ref Range Status  09/25/2021 107 (H) mg/dL (calc) Final    Comment:    Reference range: <100 . Desirable range <100 mg/dL for primary prevention;   <70 mg/dL for patients with CHD or diabetic patients  with > or = 2 CHD risk factors. Marland Kitchen LDL-C is now calculated using the Martin-Hopkins  calculation, which is a validated novel method providing  better accuracy than the Friedewald equation in the  estimation of LDL-C.  Horald Pollen et al. Lenox Ahr. 1610;960(45): 2061-2068  (http://education.Quest[employer].com/faq/FAQ164)    HDL  Date Value Ref Range Status  09/25/2021 55 > OR = 50 mg/dL Final   Triglycerides  Date Value Ref Range Status  09/25/2021 93 <150 mg/dL Final         Failed - CMP within normal limits and completed in the last 12 months    No results found for: "ALBUMIN", "ALBMS", "POCALB", "ALBUMINELP" Alkaline phosphatase (APISO)  Date Value Ref Range Status  09/25/2021 85 37 - 153 U/L Final   ALT  Date Value Ref Range Status  09/25/2021 8 6 - 29 U/L Final   AST  Date Value Ref Range Status  09/25/2021 13 10 - 35 U/L Final   BUN  Date Value Ref Range Status  09/25/2021 10 7 - 25 mg/dL Final   Calcium  Date Value Ref Range Status  09/25/2021 9.6 8.6 - 10.4 mg/dL Final   CO2  Date Value Ref Range Status  09/25/2021 27 20 - 32 mmol/L Final   Bicarbonate  Date Value Ref Range Status  12/10/2017 29.6  (H) 20.0 - 28.0 mmol/L Final   Creat  Date Value Ref Range Status  09/25/2021 0.75 0.50 - 1.05 mg/dL Final   Glucose, Bld  Date Value Ref Range Status  09/25/2021 93 65 - 99 mg/dL Final    Comment:    .            Fasting reference interval .    Potassium  Date Value Ref Range Status  09/25/2021 4.3 3.5 - 5.3 mmol/L Final   Sodium  Date Value Ref Range Status  09/25/2021 137 135 - 146 mmol/L Final   Total Bilirubin  Date Value Ref Range Status  09/25/2021 0.4 0.2 - 1.2 mg/dL Final   Total Protein  Date Value Ref Range Status  09/25/2021 6.8 6.1 - 8.1 g/dL Final   GFR, Est African American  Date Value Ref Range Status  10/16/2020 75 > OR = 60 mL/min/1.2m2 Final   eGFR  Date Value Ref Range Status  09/25/2021 88 > OR = 60 mL/min/1.66m2 Final    Comment:    The eGFR is based on the CKD-EPI 2021 equation. To calculate  the new eGFR from a previous Creatinine or Cystatin C result, go to https://www.kidney.org/professionals/ kdoqi/gfr%5Fcalculator    GFR, Est Non African American  Date  Value Ref Range Status  10/16/2020 64 > OR = 60 mL/min/1.53m2 Final         Passed - TSH in normal range and within 360 days    TSH  Date Value Ref Range Status  09/25/2021 3.10 0.40 - 4.50 mIU/L Final         Passed - Completed PHQ-2 or PHQ-9 in the last 360 days      Passed - Last BP in normal range    BP Readings from Last 1 Encounters:  03/28/22 134/76         Passed - Last Heart Rate in normal range    Pulse Readings from Last 1 Encounters:  03/28/22 71         Passed - Valid encounter within last 6 months    Recent Outpatient Visits           2 months ago Centrilobular emphysema (HCC)   [facility] Delles, [name], RPH-CPP   4 months ago Centrilobular emphysema (HCC)   [place] [facility] Delles, [name], RPH-CPP   4 months ago Centrilobular emphysema [facility])   [place] [facility]  [place], Netta Neat, DO   10 months ago Annual physical exam   [place] [facility], Netta Neat, DO   10 months ago Blepharitis of upper and lower eyelids of both eyes, unspecified type   [place] [facility] [place], Netta Neat, DO       Future Appointments             In 1 month Althea Charon, Netta Neat, DO  [facility], [facility]            Passed - CBC within normal limits and completed in the last 12 months    WBC  Date Value Ref Range Status  09/25/2021 7.7 3.8 - 10.8 Thousand/uL Final   RBC  Date Value Ref Range Status  09/25/2021 4.43 3.80 - 5.10 Million/uL Final   Hemoglobin  Date Value Ref Range Status  09/25/2021 12.1 11.7 - 15.5 g/dL Final   HCT  Date Value Ref Range Status  09/25/2021 38.1 35.0 - 45.0 % Final   MCHC  Date Value Ref Range Status  09/25/2021 31.8 (L) 32.0 - 36.0 g/dL Final   [facility]  Date Value Ref Range Status  09/25/2021 27.3 27.0 - 33.0 pg Final   MCV  Date Value Ref Range Status  09/25/2021 86.0 80.0 - 100.0 fL Final   No results found for: "PLTCOUNTKUC", "LABPLAT", "POCPLA" RDW  Date Value Ref Range Status  09/25/2021 14.5 11.0 - 15.0 % Final          albuterol (VENTOLIN HFA) 108 (90 Base) MCG/ACT inhaler [Pharmacy Med Name: ALBUTEROL SULFATE HFA 108 (90 BASE)] 8.5 g 0    Sig: INHALE 2 PUFFS INTO THE LUNGS EVERY 4 HOURS AS NEEDED FOR WHEEZING OR SHORTNESS OF BREATH     Pulmonology:  Beta Agonists 2 Passed - 08/16/2022  9:28 AM      Passed - Last BP in normal range    BP Readings from Last 1 Encounters:  03/28/22 134/76         Passed - Last Heart Rate in normal range    Pulse Readings from Last 1 Encounters:  03/28/22 71         Passed - Valid encounter within last 12 months    Recent Outpatient  Visits           2 months ago Centrilobular emphysema [facility])   [place] [facility] Delles, [name], RPH-CPP   4 months  ago Centrilobular emphysema [facility])   [facility] Delles, [name], RPH-CPP   4 months ago [facility]obular emphysema [facility])   [place] [facility] Smitty Cords, DO   10 months ago Annual physical exam   [place] [facility] Smitty Cords, DO   10 months ago Blepharitis of upper and lower eyelids of both eyes, unspecified type   [place] [facility] [place], Netta Neat, DO       Future Appointments             In 1 month Althea Charon, Netta Neat, DO [place] [facility], [facility]

## 2022-08-16 NOTE — Telephone Encounter (Signed)
Requested medication (s) are due for refill today: yes  Requested medication (s) are on the active medication list:yes  Last refill:  02/21/22  Future visit scheduled: yes  Notes to clinic:  Unable to refill per protocol, cannot delegate.      Requested Prescriptions  Pending Prescriptions Disp Refills   QUEtiapine (SEROQUEL) 100 MG tablet [Pharmacy Med Name: QUETIAPINE FUMARATE 100 MG TAB] 135 tablet 1    Sig: TAKE 1 AND 1/2 TABLETS BY MOUTH AT BEDTIME     Not Delegated - Psychiatry:  Antipsychotics - Second Generation (Atypical) - quetiapine Failed - 08/16/2022  9:28 AM      Failed - This refill cannot be delegated      Failed - Lipid Panel in normal range within the last 12 months    Cholesterol  Date Value Ref Range Status  09/25/2021 181 <200 mg/dL Final   LDL Cholesterol (Calc)  Date Value Ref Range Status  09/25/2021 107 (H) mg/dL (calc) Final    Comment:    Reference range: <100 . Desirable range <100 mg/dL for primary prevention;   <70 mg/dL for patients with CHD or diabetic patients  with > or = 2 CHD risk factors. Marland Kitchen LDL-C is now calculated using the Martin-Hopkins  calculation, which is a validated novel method providing  better accuracy than the Friedewald equation in the  estimation of LDL-C.  Horald Pollen et al. Lenox Ahr. 1610;960(45): 2061-2068  (http://education.Quest[employer].com/faq/FAQ164)    HDL  Date Value Ref Range Status  09/25/2021 55 > OR = 50 mg/dL Final   Triglycerides  Date Value Ref Range Status  09/25/2021 93 <150 mg/dL Final         Failed - CMP within normal limits and completed in the last 12 months    No results found for: "ALBUMIN", "ALBMS", "POCALB", "ALBUMINELP" Alkaline phosphatase (APISO)  Date Value Ref Range Status  09/25/2021 85 37 - 153 U/L Final   ALT  Date Value Ref Range Status  09/25/2021 8 6 - 29 U/L Final   AST  Date Value Ref Range Status  09/25/2021 13 10 - 35 U/L Final   BUN  Date Value Ref Range  Status  09/25/2021 10 7 - 25 mg/dL Final   Calcium  Date Value Ref Range Status  09/25/2021 9.6 8.6 - 10.4 mg/dL Final   CO2  Date Value Ref Range Status  09/25/2021 27 20 - 32 mmol/L Final   Bicarbonate  Date Value Ref Range Status  12/10/2017 29.6 (H) 20.0 - 28.0 mmol/L Final   Creat  Date Value Ref Range Status  09/25/2021 0.75 0.50 - 1.05 mg/dL Final   Glucose, Bld  Date Value Ref Range Status  09/25/2021 93 65 - 99 mg/dL Final    Comment:    .            Fasting reference interval .    Potassium  Date Value Ref Range Status  09/25/2021 4.3 3.5 - 5.3 mmol/L Final   Sodium  Date Value Ref Range Status  09/25/2021 137 135 - 146 mmol/L Final   Total Bilirubin  Date Value Ref Range Status  09/25/2021 0.4 0.2 - 1.2 mg/dL Final   Total Protein  Date Value Ref Range Status  09/25/2021 6.8 6.1 - 8.1 g/dL Final   GFR, Est African American  Date Value Ref Range Status  10/16/2020 75 > OR = 60 mL/min/1.59m2 Final   eGFR  Date Value Ref Range Status  09/25/2021 88 > OR = 60  mL/min/1.58m2 Final    Comment:    The eGFR is based on the CKD-EPI 2021 equation. To calculate  the new eGFR from a previous Creatinine or Cystatin C result, go to https://www.kidney.org/professionals/ kdoqi/gfr%5Fcalculator    GFR, Est Non African American  Date Value Ref Range Status  10/16/2020 64 > OR = 60 mL/min/1.60m2 Final         Passed - TSH in normal range and within 360 days    TSH  Date Value Ref Range Status  09/25/2021 3.10 0.40 - 4.50 mIU/L Final         Passed - Completed PHQ-2 or PHQ-9 in the last 360 days      Passed - Last BP in normal range    BP Readings from Last 1 Encounters:  03/28/22 134/76         Passed - Last Heart Rate in normal range    Pulse Readings from Last 1 Encounters:  03/28/22 71         Passed - Valid encounter within last 6 months    Recent Outpatient Visits           2 months ago Centrilobular emphysema (HCC)   [place]  [facility], [name], RPH-CPP   4 months ago Centrilobular emphysema (HCC)   [place] [facility] Delles, [name], RPH-CPP   4 months ago Centrilobular emphysema [facility])   [place] [facility] [place], Netta Neat, DO   10 months ago Annual physical exam   [place] [facility] [place], Netta Neat, DO   10 months ago Blepharitis of upper and lower eyelids of both eyes, unspecified type   [facility] [place], Netta Neat, DO       Future Appointments             In 1 month Althea Charon, Netta Neat, DO [place] [facility]            Passed - CBC within normal limits and completed in the last 12 months    WBC  Date Value Ref Range Status  09/25/2021 7.7 3.8 - 10.8 Thousand/uL Final   RBC  Date Value Ref Range Status  09/25/2021 4.43 3.80 - 5.10 Million/uL Final   Hemoglobin  Date Value Ref Range Status  09/25/2021 12.1 11.7 - 15.5 g/dL Final   HCT  Date Value Ref Range Status  09/25/2021 38.1 35.0 - 45.0 % Final   MCHC  Date Value Ref Range Status  09/25/2021 31.8 (L) 32.0 - 36.0 g/dL Final   [facility]  Date Value Ref Range Status  09/25/2021 27.3 27.0 - 33.0 pg Final   MCV  Date Value Ref Range Status  09/25/2021 86.0 80.0 - 100.0 fL Final   No results found for: "PLTCOUNTKUC", "LABPLAT", "POCPLA" RDW  Date Value Ref Range Status  09/25/2021 14.5 11.0 - 15.0 % Final         Signed Prescriptions Disp Refills   albuterol (VENTOLIN HFA) 108 (90 Base) MCG/ACT inhaler 8.5 g 0    Sig: INHALE 2 PUFFS INTO THE LUNGS EVERY 4 HOURS AS NEEDED FOR WHEEZING OR SHORTNESS OF BREATH     Pulmonology:  Beta Agonists 2 Passed - 08/16/2022  9:28 AM      Passed - Last BP in normal range    BP Readings from Last 1 Encounters:  03/28/22 134/76  Passed - Last Heart Rate in normal range    Pulse Readings from Last 1  Encounters:  03/28/22 71         Passed - Valid encounter within last 12 months    Recent Outpatient Visits           2 months ago Centrilobular emphysema [facility])   [place] [facility] Delles, [name], RPH-CPP   4 months ago Centrilobular emphysema [facility])   [place] [facility] Delles, [name], RPH-CPP   4 months ago Centrilobular emphysema [facility])   [place] [facility] Smitty Cords, DO   10 months ago Annual physical exam   [place] [facility] Smitty Cords, DO   10 months ago Blepharitis of upper and lower eyelids of both eyes, unspecified type   [place] [facility] Althea Charon, Netta Neat, DO       Future Appointments             In 1 month Althea Charon, Netta Neat, DO [place] [facility], [facility]

## 2022-08-16 NOTE — Telephone Encounter (Signed)
Medication Refill - Medication: albuterol (VENTOLIN HFA) 108 (90 Base) MCG/ACT inhale /  QUEtiapine (SEROQUEL) 100 MG tablet Pt called in is all out of these meds, says can't sleep without Seroquil  Has the patient contacted their pharmacy? yes (Agent: If no, request that the patient contact the pharmacy for the refill. If patient does not wish to contact the pharmacy document the reason why and proceed with request.) (Agent: If yes, when and what did the pharmacy advise?)contact pcp  Preferred Pharmacy (with phone number or street name): [facility] - [place], [place] - [address] [place], [place] [address] Phone: [phone]  Fax: [phone]  Has the patient been seen for an appointment in the last year OR does the patient have an upcoming appointment? yes  Agent: Please be advised that RX refills may take up to 3 business days. We ask that you follow-up with your pharmacy.

## 2022-09-04 ENCOUNTER — Other Ambulatory Visit: Payer: Self-pay | Admitting: Family Medicine

## 2022-09-04 DIAGNOSIS — J302 Other seasonal allergic rhinitis: Secondary | ICD-10-CM

## 2022-09-04 NOTE — Telephone Encounter (Signed)
Requested Prescriptions  Pending Prescriptions Disp Refills   montelukast (SINGULAIR) 10 MG tablet [Pharmacy Med Name: MONTELUKAST SODIUM 10 MG TAB] 90 tablet 1    Sig: TAKE 1 TABLET BY MOUTH DAILY     Pulmonology:  Leukotriene Inhibitors Passed - 09/04/2022 12:26 PM      Passed - Valid encounter within last 12 months    Recent Outpatient Visits           2 months ago Centrilobular emphysema [facility])   [place] [facility] Delles, [name], RPH-CPP   4 months ago Centrilobular emphysema [facility])   [place] [facility] Delles, [name], RPH-CPP   5 months ago Centrilobular emphysema [facility])   [place] [facility] Smitty Cords, DO   11 months ago Annual physical exam   [place] [facility] Smitty Cords, DO   11 months ago Blepharitis of upper and lower eyelids of both eyes, unspecified type    [facility] [place], Netta Neat, DO       Future Appointments             In 3 weeks Althea Charon, Netta Neat, DO  [facility], [facility]

## 2022-09-16 ENCOUNTER — Other Ambulatory Visit: Payer: Self-pay | Admitting: Family Medicine

## 2022-09-16 DIAGNOSIS — M159 Polyosteoarthritis, unspecified: Secondary | ICD-10-CM

## 2022-09-16 DIAGNOSIS — M4722 Other spondylosis with radiculopathy, cervical region: Secondary | ICD-10-CM

## 2022-09-16 DIAGNOSIS — G894 Chronic pain syndrome: Secondary | ICD-10-CM

## 2022-09-17 NOTE — Telephone Encounter (Signed)
Requested Prescriptions  Pending Prescriptions Disp Refills   gabapentin (NEURONTIN) 400 MG capsule [Pharmacy Med Name: GABAPENTIN 400 MG CAP] 120 capsule 0    Sig: TAKE 1 CAPSULE BY MOUTH 4 TIMES DAILY     Neurology: Anticonvulsants - gabapentin Passed - 09/16/2022 10:55 AM      Passed - Cr in normal range and within 360 days    Creat  Date Value Ref Range Status  09/25/2021 0.75 0.50 - 1.05 mg/dL Final         Passed - Completed PHQ-2 or PHQ-9 in the last 360 days      Passed - Valid encounter within last 12 months    Recent Outpatient Visits           3 months ago Centrilobular emphysema [facility])   [place] [facility] Delles, [name], RPH-CPP   5 months ago Centrilobular emphysema [facility])   [place] [facility] Delles, [name], RPH-CPP   5 months ago Centrilobular emphysema [facility])   [place] [facility] Smitty Cords, DO   11 months ago Annual physical exam   [place] [facility] Smitty Cords, DO   12 months ago Blepharitis of upper and lower eyelids of both eyes, unspecified type   [place] [facility] [place], Netta Neat, DO       Future Appointments             In 1 week Althea Charon, Netta Neat, DO [place] [facility], [facility]

## 2022-09-19 ENCOUNTER — Other Ambulatory Visit: Payer: Medicare Other

## 2022-09-19 DIAGNOSIS — J432 Centrilobular emphysema: Secondary | ICD-10-CM

## 2022-09-19 DIAGNOSIS — Z0000 Encounter for general adult medical examination without abnormal findings: Secondary | ICD-10-CM

## 2022-09-19 DIAGNOSIS — R7309 Other abnormal glucose: Secondary | ICD-10-CM | POA: Diagnosis not present

## 2022-09-19 DIAGNOSIS — I10 Essential (primary) hypertension: Secondary | ICD-10-CM

## 2022-09-19 DIAGNOSIS — E039 Hypothyroidism, unspecified: Secondary | ICD-10-CM

## 2022-09-19 DIAGNOSIS — E782 Mixed hyperlipidemia: Secondary | ICD-10-CM

## 2022-09-19 LAB — CBC WITH DIFFERENTIAL/PLATELET
Basophils Relative: 1.3 %
MCH: 27.1 pg (ref 27.0–33.0)
MCV: 84.3 fL (ref 80.0–100.0)
Neutrophils Relative %: 50.7 %
Platelets: 234 10*3/uL (ref 140–400)

## 2022-09-20 LAB — COMPLETE METABOLIC PANEL WITH GFR
AG Ratio: 1.4 (calc) (ref 1.0–2.5)
ALT: 7 U/L (ref 6–29)
AST: 13 U/L (ref 10–35)
Albumin: 3.9 g/dL (ref 3.6–5.1)
Alkaline phosphatase (APISO): 85 U/L (ref 37–153)
BUN: 10 mg/dL (ref 7–25)
CO2: 32 mmol/L (ref 20–32)
Calcium: 9.1 mg/dL (ref 8.6–10.4)
Chloride: 98 mmol/L (ref 98–110)
Creat: 0.8 mg/dL (ref 0.50–1.05)
Globulin: 2.7 g/dL (calc) (ref 1.9–3.7)
Glucose, Bld: 83 mg/dL (ref 65–99)
Potassium: 4.5 mmol/L (ref 3.5–5.3)
Sodium: 136 mmol/L (ref 135–146)
Total Bilirubin: 0.3 mg/dL (ref 0.2–1.2)
Total Protein: 6.6 g/dL (ref 6.1–8.1)
eGFR: 81 mL/min/{1.73_m2} (ref >=60)

## 2022-09-20 LAB — HEMOGLOBIN A1C
Hgb A1c MFr Bld: 5.8 % of total Hgb — ABNORMAL HIGH (ref <5.7)
Mean Plasma Glucose: 120 mg/dL
eAG (mmol/L): 6.6 mmol/L

## 2022-09-20 LAB — TSH: TSH: 4.84 mIU/L — ABNORMAL HIGH (ref 0.40–4.50)

## 2022-09-20 LAB — LIPID PANEL
Cholesterol: 186 mg/dL (ref <200)
HDL: 48 mg/dL — ABNORMAL LOW (ref >=50)
LDL Cholesterol (Calc): 113 mg/dL (calc) — ABNORMAL HIGH
Non-HDL Cholesterol (Calc): 138 mg/dL (calc) — ABNORMAL HIGH (ref <130)
Total CHOL/HDL Ratio: 3.9 (calc) (ref <5.0)
Triglycerides: 140 mg/dL (ref <150)

## 2022-09-20 LAB — CBC WITH DIFFERENTIAL/PLATELET
Absolute Monocytes: 462 cells/uL (ref 200–950)
Basophils Absolute: 91 cells/uL (ref 0–200)
Eosinophils Absolute: 252 cells/uL (ref 15–500)
Eosinophils Relative: 3.6 %
HCT: 35.4 % (ref 35.0–45.0)
Hemoglobin: 11.4 g/dL — ABNORMAL LOW (ref 11.7–15.5)
Lymphs Abs: 2646 cells/uL (ref 850–3900)
MCHC: 32.2 g/dL (ref 32.0–36.0)
MPV: 9.3 fL (ref 7.5–12.5)
Monocytes Relative: 6.6 %
Neutro Abs: 3549 cells/uL (ref 1500–7800)
RBC: 4.2 10*6/uL (ref 3.80–5.10)
RDW: 15.2 % — ABNORMAL HIGH (ref 11.0–15.0)
Total Lymphocyte: 37.8 %
WBC: 7 10*3/uL (ref 3.8–10.8)

## 2022-09-20 LAB — T4, FREE: Free T4: 0.8 ng/dL (ref 0.8–1.8)

## 2022-09-24 ENCOUNTER — Other Ambulatory Visit: Payer: Self-pay | Admitting: Family Medicine

## 2022-09-24 DIAGNOSIS — J432 Centrilobular emphysema: Secondary | ICD-10-CM

## 2022-09-25 DIAGNOSIS — J449 Chronic obstructive pulmonary disease, unspecified: Secondary | ICD-10-CM | POA: Diagnosis not present

## 2022-09-25 NOTE — Telephone Encounter (Signed)
Requested Prescriptions  Pending Prescriptions Disp Refills   albuterol (VENTOLIN HFA) 108 (90 Base) MCG/ACT inhaler [Pharmacy Med Name: ALBUTEROL SULFATE HFA 108 (90 BASE)] 8.5 g 0    Sig: INHALE 2 PUFFS INTO THE LUNGS EVERY 4 HOURS AS NEEDED FOR WHEEZING OR SHORTNESS OF BREATH     Pulmonology:  Beta Agonists 2 Passed - 09/24/2022  5:56 PM      Passed - Last BP in normal range    BP Readings from Last 1 Encounters:  03/28/22 134/76         Passed - Last Heart Rate in normal range    Pulse Readings from Last 1 Encounters:  03/28/22 71         Passed - Valid encounter within last 12 months    Recent Outpatient Visits           3 months ago Centrilobular emphysema (HCC)   [place] [facility] Delles, [name], RPH-CPP   5 months ago Centrilobular emphysema [facility])   [facility], [name], RPH-CPP   6 months ago Centrilobular emphysema [facility])   [place] [facility] Smitty Cords, DO   1 year ago Annual physical exam   [place] [facility] Smitty Cords, DO   1 year ago Blepharitis of upper and lower eyelids of both eyes, unspecified type   [place] [facility] [place], Netta Neat, DO       Future Appointments             Tomorrow Althea Charon, Netta Neat, DO [place] [facility], [facility] [place] [facility]

## 2022-09-26 ENCOUNTER — Other Ambulatory Visit: Payer: Self-pay | Admitting: Family Medicine

## 2022-09-26 ENCOUNTER — Encounter: Payer: Self-pay | Admitting: Family Medicine

## 2022-09-26 ENCOUNTER — Ambulatory Visit (INDEPENDENT_AMBULATORY_CARE_PROVIDER_SITE_OTHER): Payer: Medicare Other | Admitting: Family Medicine

## 2022-09-26 VITALS — BP 128/78 | HR 73 | Ht 64.5 in | Wt 171.0 lb

## 2022-09-26 DIAGNOSIS — Z87891 Personal history of nicotine dependence: Secondary | ICD-10-CM | POA: Diagnosis not present

## 2022-09-26 DIAGNOSIS — J432 Centrilobular emphysema: Secondary | ICD-10-CM | POA: Diagnosis not present

## 2022-09-26 DIAGNOSIS — Z0000 Encounter for general adult medical examination without abnormal findings: Secondary | ICD-10-CM

## 2022-09-26 DIAGNOSIS — I10 Essential (primary) hypertension: Secondary | ICD-10-CM | POA: Diagnosis not present

## 2022-09-26 DIAGNOSIS — R7309 Other abnormal glucose: Secondary | ICD-10-CM | POA: Diagnosis not present

## 2022-09-26 DIAGNOSIS — E782 Mixed hyperlipidemia: Secondary | ICD-10-CM

## 2022-09-26 DIAGNOSIS — E039 Hypothyroidism, unspecified: Secondary | ICD-10-CM | POA: Diagnosis not present

## 2022-09-26 DIAGNOSIS — F5104 Psychophysiologic insomnia: Secondary | ICD-10-CM

## 2022-09-26 DIAGNOSIS — M150 Primary generalized (osteo)arthritis: Secondary | ICD-10-CM

## 2022-09-26 DIAGNOSIS — G894 Chronic pain syndrome: Secondary | ICD-10-CM

## 2022-09-26 DIAGNOSIS — M4722 Other spondylosis with radiculopathy, cervical region: Secondary | ICD-10-CM

## 2022-09-26 DIAGNOSIS — M159 Polyosteoarthritis, unspecified: Secondary | ICD-10-CM | POA: Diagnosis not present

## 2022-09-26 MED ORDER — LEVOTHYROXINE SODIUM 100 MCG PO TABS: 100.0000 ug | ORAL_TABLET | Freq: Every day | ORAL | 3 refills | Status: DC

## 2022-09-26 MED ORDER — DICLOFENAC SODIUM 1 % EX GEL: 2.0000 g | Freq: Four times a day (QID) | CUTANEOUS | 2 refills | Status: DC | PRN

## 2022-09-26 MED ORDER — GABAPENTIN 400 MG PO CAPS
ORAL_CAPSULE | ORAL | 3 refills | Status: AC
Start: 2022-09-26 — End: ?

## 2022-09-26 MED ORDER — QUETIAPINE FUMARATE 100 MG PO TABS
200.0000 mg | ORAL_TABLET | Freq: Every day | ORAL | 1 refills | Status: DC
Start: 2022-09-26 — End: 2023-05-29

## 2022-09-26 NOTE — Progress Notes (Signed)
Subjective:    Patient ID: Audrey Campbell, female    DOB: July 05, 1955, 66 y.o.   MRN: [id number]  Audrey Campbell is a 67 y.o. female presenting on 09/26/2022 for Annual Exam   HPI  Centrilobular Emphysema  COPD Tobacco Abuse -  former smoker Quit smoking 08/01/22 On Breztri Interested in CT imaging for LDCT  Recurrent Depression, Chronic / Anxiety / Insomnia Stable problems Continues on Quetiapine nightly, 150mg , asking about dose inc again to prior dose. Off Clonazepam   HTN On Amlodipine 10mg  daily   Osteoarthritis multiple joints Using topical voltaren generic diclofenac   Elevated A1c Result A1c 5.8, up from prior 5.3 to 5.6  Elevated TSH Prior history of hypothyroidism Lab with TSH >4 and Free T4 0.8 Previously on Levothyroxine daily now off med for 1 year Interested to resume lower dose.      09/26/2022   10:45 AM 03/28/2022   10:55 AM 03/01/2022   11:22 AM  Depression screen PHQ 2/9  Decreased Interest 0 1 0  Down, Depressed, Hopeless 0 0 0  PHQ - 2 Score 0 1 0  Altered sleeping 0 0 0  Tired, decreased energy 0 2 0  Change in appetite 0 2 0  Feeling bad or failure about yourself  0 0 0  Trouble concentrating 0 0 0  Moving slowly or fidgety/restless 0 0 0  Suicidal thoughts 0 0 0  PHQ-9 Score 0 5 0  Difficult doing work/chores  Not difficult at all Not difficult at all    Social History   Tobacco Use   Smoking status: Former    Packs/day: 0.50    Years: 40.00    Additional pack years: 0.00    Total pack years: 20.00    Types: Cigarettes    Start date: 04/29/1972    Quit date: 08/01/2022    Years since quitting: 0.1   Smokeless tobacco: Never  Vaping Use   Vaping Use: Never used  Substance Use Topics   Alcohol use: No   Drug use: Yes    Types: Marijuana    Review of Systems Per HPI unless specifically indicated above     Objective:    BP 128/78   Pulse 73   Ht 5' 4.5" (1.638 m)   Wt 171 lb (77.6 kg)   SpO2 96%   BMI  28.90 kg/m   Wt Readings from Last 3 Encounters:  09/26/22 171 lb (77.6 kg)  03/28/22 164 lb 6.4 oz (74.6 kg)  03/01/22 166 lb 12.8 oz (75.7 kg)    Physical Exam Vitals and nursing note reviewed.  Constitutional:      General: She is not in acute distress.    Appearance: She is well-developed. She is not diaphoretic.     Comments: Well-appearing, comfortable, cooperative  HENT:     Head: Normocephalic and atraumatic.  Eyes:     General:        Right eye: No discharge.        Left eye: No discharge.     Conjunctiva/sclera: Conjunctivae normal.  Neck:     Thyroid: No thyromegaly.  Cardiovascular:     Rate and Rhythm: Normal rate and regular rhythm.     Heart sounds: Normal heart sounds. No murmur heard. Pulmonary:     Effort: Pulmonary effort is normal. No respiratory distress.     Breath sounds: Wheezing present. No rales.     Comments: Mild reduced air movement Musculoskeletal:  General: Normal range of motion.     Cervical back: Normal range of motion and neck supple.  Lymphadenopathy:     Cervical: No cervical adenopathy.  Skin:    General: Skin is warm and dry.     Findings: No erythema or rash.  Neurological:     Mental Status: She is alert and oriented to person, place, and time.  Psychiatric:        Behavior: Behavior normal.     Comments: Well groomed, good eye contact, normal speech and thoughts      Results for orders placed or performed in visit on 09/19/22  T4, free  Result Value Ref Range   Free T4 0.8 0.8 - 1.8 ng/dL  TSH  Result Value Ref Range   TSH 4.84 (H) 0.40 - 4.50 mIU/L  Hemoglobin A1c  Result Value Ref Range   Hgb A1c MFr Bld 5.8 (H) <5.7 % of total Hgb   Mean Plasma Glucose 120 mg/dL   eAG (mmol/L) 6.6 mmol/L  Lipid panel  Result Value Ref Range   Cholesterol 186 <200 mg/dL   HDL 48 (L) > OR = 50 mg/dL   Triglycerides 409 <811 mg/dL   LDL Cholesterol (Calc) 113 (H) mg/dL (calc)   Total CHOL/HDL Ratio 3.9 <5.0 (calc)    Non-HDL Cholesterol (Calc) 138 (H) <130 mg/dL (calc)  CBC with Differential/Platelet  Result Value Ref Range   WBC 7.0 3.8 - 10.8 Thousand/uL   RBC 4.20 3.80 - 5.10 Million/uL   Hemoglobin 11.4 (L) 11.7 - 15.5 g/dL   HCT 91.4 78.2 - 95.6 %   MCV 84.3 80.0 - 100.0 fL   MCH 27.1 27.0 - 33.0 pg   MCHC 32.2 32.0 - 36.0 g/dL   RDW 21.3 (H) 08.6 - 57.8 %   Platelets 234 140 - 400 Thousand/uL   MPV 9.3 7.5 - 12.5 fL   Neutro Abs 3,549 1,500 - 7,800 cells/uL   Lymphs Abs 2,646 850 - 3,900 cells/uL   Absolute Monocytes 462 200 - 950 cells/uL   Eosinophils Absolute 252 15 - 500 cells/uL   Basophils Absolute 91 0 - 200 cells/uL   Neutrophils Relative % 50.7 %   Total Lymphocyte 37.8 %   Monocytes Relative 6.6 %   Eosinophils Relative 3.6 %   Basophils Relative 1.3 %  COMPLETE METABOLIC PANEL WITH GFR  Result Value Ref Range   Glucose, Bld 83 65 - 99 mg/dL   BUN 10 7 - 25 mg/dL   Creat 4.69 6.29 - 5.28 mg/dL   eGFR 81 > OR = 60 UX/LKG/4.01U2   BUN/Creatinine Ratio SEE NOTE: 6 - 22 (calc)   Sodium 136 135 - 146 mmol/L   Potassium 4.5 3.5 - 5.3 mmol/L   Chloride 98 98 - 110 mmol/L   CO2 32 20 - 32 mmol/L   Calcium 9.1 8.6 - 10.4 mg/dL   Total Protein 6.6 6.1 - 8.1 g/dL   Albumin 3.9 3.6 - 5.1 g/dL   Globulin 2.7 1.9 - 3.7 g/dL (calc)   AG Ratio 1.4 1.0 - 2.5 (calc)   Total Bilirubin 0.3 0.2 - 1.2 mg/dL   Alkaline phosphatase (APISO) 85 37 - 153 U/L   AST 13 10 - 35 U/L   ALT 7 6 - 29 U/L      Assessment & Plan:   Problem List Items Addressed This Visit     Centrilobular emphysema (HCC) - Primary   Relevant Orders   Ambulatory Referral for Lung Cancer  Scre   Chronic pain syndrome   Relevant Medications   gabapentin (NEURONTIN) 400 MG capsule   Elevated hemoglobin A1c   Essential hypertension   Former smoker   Relevant Orders   Ambulatory Referral for Lung Cancer Scre   Hypothyroidism   Relevant Medications   levothyroxine (SYNTHROID) 100 MCG tablet   Osteoarthritis of  spine with radiculopathy, cervical region   Relevant Medications   gabapentin (NEURONTIN) 400 MG capsule   QUEtiapine (SEROQUEL) 100 MG tablet   Primary osteoarthritis involving multiple joints   Relevant Medications   gabapentin (NEURONTIN) 400 MG capsule   diclofenac Sodium (VOLTAREN) 1 % GEL   Psychophysiological insomnia   Relevant Medications   QUEtiapine (SEROQUEL) 100 MG tablet    Re ordered medications, up to 90 days Re ordered the diclofenac generic gel for arthritis  Hypothyroidism Based on lab elevated TSH off med Restart Levothyroxine daily (half dose) daily  Insomnia Increased Seroquel to 200 nightly. (Previously on 150mg  dose)  Former smoker (tobacco, cigarettes) Limit marijuana use in future  Referral for Lung Screening CT imaging.  [facility] [address] [place], [place] [address] Phone: [phone]  Meds ordered this encounter  Medications   gabapentin (NEURONTIN) 400 MG capsule    Sig: TAKE 1 CAPSULE BY MOUTH 4 TIMES DAILY    Dispense:  360 capsule    Refill:  3    Increase quantity to 90 day   diclofenac Sodium (VOLTAREN) 1 % GEL    Sig: Apply 2 g topically 4 (four) times daily as needed (arthritis knee pain).    Dispense:  100 g    Refill:  2   QUEtiapine (SEROQUEL) 100 MG tablet    Sig: Take 2 tablets (200 mg total) by mouth at bedtime.    Dispense:  180 tablet    Refill:  1    Dose increase back to 200mg  nightly   levothyroxine (SYNTHROID) 100 MCG tablet    Sig: Take 1 tablet (100 mcg total) by mouth daily before breakfast.    Dispense:  90 tablet    Refill:  3      Follow up plan: Return in about 6 months (around 03/29/2023) for 6 month fasting lab then 1 week later Follow-up Thyroid, Emphysema, PreDiabetes.  Future labs ordered for 04/01/23  Saralyn Pilar, DO [facility] 09/26/2022, 10:42 AM

## 2022-09-26 NOTE — Patient Instructions (Addendum)
Thank you for coming to the office today.  Re ordered medications, up to 90 days Re ordered the diclofenac generic gel  Restart Levothyroxine daily (half dose) daily  Increased Seroquel to 200 nightly.  Referral for Lung Screening CT imaging.  [facility] [address] [place], [place] [address] Phone: [phone]  -----  Congrats on quitting smoking!  DUE for FASTING BLOOD WORK (no food or drink after midnight before the lab appointment, only water or coffee without cream/sugar on the morning of)  SCHEDULE "Lab Only" visit in the morning at the clinic for lab draw in 6 MONTHS   - Make sure Lab Only appointment is at about 1 week before your next appointment, so that results will be available  For Lab Results, once available within 2-3 days of blood draw, you can can log in to MyChart online to view your results and a brief explanation. Also, we can discuss results at next follow-up visit.    Please schedule a Follow-up Appointment to: Return in about 6 months (around 03/29/2023) for 6 month fasting lab then 1 week later Follow-up Thyroid, Emphysema, PreDiabetes.  If you have any other questions or concerns, please feel free to call the office or send a message through MyChart. You may also schedule an earlier appointment if necessary.  Additionally, you may be receiving a survey about your experience at our office within a few days to 1 week by e-mail or mail. We value your feedback.  Saralyn Pilar, DO [facility], [place]

## 2022-10-10 DIAGNOSIS — M722 Plantar fascial fibromatosis: Secondary | ICD-10-CM | POA: Diagnosis not present

## 2022-10-10 DIAGNOSIS — M25519 Pain in unspecified shoulder: Secondary | ICD-10-CM | POA: Diagnosis not present

## 2022-10-10 DIAGNOSIS — M542 Cervicalgia: Secondary | ICD-10-CM | POA: Diagnosis not present

## 2022-10-10 DIAGNOSIS — Z79891 Long term (current) use of opiate analgesic: Secondary | ICD-10-CM | POA: Diagnosis not present

## 2022-10-10 DIAGNOSIS — G894 Chronic pain syndrome: Secondary | ICD-10-CM | POA: Diagnosis not present

## 2022-10-10 DIAGNOSIS — M545 Low back pain, unspecified: Secondary | ICD-10-CM | POA: Diagnosis not present

## 2022-10-28 DIAGNOSIS — H5213 Myopia, bilateral: Secondary | ICD-10-CM | POA: Diagnosis not present

## 2022-10-28 DIAGNOSIS — H04123 Dry eye syndrome of bilateral lacrimal glands: Secondary | ICD-10-CM | POA: Diagnosis not present

## 2022-10-28 DIAGNOSIS — H52223 Regular astigmatism, bilateral: Secondary | ICD-10-CM | POA: Diagnosis not present

## 2022-10-28 DIAGNOSIS — H35363 Drusen (degenerative) of macula, bilateral: Secondary | ICD-10-CM | POA: Diagnosis not present

## 2022-10-28 DIAGNOSIS — H2513 Age-related nuclear cataract, bilateral: Secondary | ICD-10-CM | POA: Diagnosis not present

## 2022-10-28 DIAGNOSIS — H524 Presbyopia: Secondary | ICD-10-CM | POA: Diagnosis not present

## 2022-11-07 DIAGNOSIS — H35033 Hypertensive retinopathy, bilateral: Secondary | ICD-10-CM | POA: Diagnosis not present

## 2022-11-07 DIAGNOSIS — H04123 Dry eye syndrome of bilateral lacrimal glands: Secondary | ICD-10-CM | POA: Diagnosis not present

## 2022-11-07 DIAGNOSIS — H538 Other visual disturbances: Secondary | ICD-10-CM | POA: Diagnosis not present

## 2022-11-07 DIAGNOSIS — H2513 Age-related nuclear cataract, bilateral: Secondary | ICD-10-CM | POA: Diagnosis not present

## 2022-11-08 ENCOUNTER — Other Ambulatory Visit: Payer: Self-pay | Admitting: Family Medicine

## 2022-11-08 NOTE — Telephone Encounter (Signed)
Requested medication (s) are due for refill today: routing for review  Requested medication (s) are on the active medication list: yes  Last refill:  unknown  Future visit scheduled: yes  Notes to clinic:  Unable to refill per protocol, historical medication. Routing for approval.     Requested Prescriptions  Pending Prescriptions Disp Refills   albuterol (PROVENTIL) (2.5 MG/3ML) 0.083% nebulizer solution [Pharmacy Med Name: albuterol sulfate 2.5 mg/3 mL (0.083 %) solution for nebulization]  11    Sig: USE 1 VIAL IN NEBULIZER 4 TIMES DAILY - And As Needed     Pulmonology:  Beta Agonists 2 Passed - 11/08/2022 11:17 AM      Passed - Last BP in normal range    BP Readings from Last 1 Encounters:  09/26/22 128/78         Passed - Last Heart Rate in normal range    Pulse Readings from Last 1 Encounters:  09/26/22 73         Passed - Valid encounter within last 12 months    Recent Outpatient Visits           1 month ago Centrilobular emphysema [facility])   [place] [facility] [place], Netta Neat, DO   4 months ago Centrilobular emphysema [facility])   [place], [name], RPH-CPP   6 months ago Centrilobular emphysema [facility])   [place] [facility] D[place], [name], RPH-CPP   7 months ago Centrilobular emphysema [facility])   [place] [facility] Smitty Cords, DO   1 year ago Annual physical exam   [place] [facility] Smitty Cords, DO       Future Appointments             In 5 months Althea Charon, Netta Neat, DO  [facility], [facility]

## 2022-11-15 ENCOUNTER — Encounter: Payer: Self-pay | Admitting: Ophthalmology

## 2022-11-15 ENCOUNTER — Other Ambulatory Visit: Payer: Self-pay

## 2022-11-15 DIAGNOSIS — Z87891 Personal history of nicotine dependence: Secondary | ICD-10-CM

## 2022-11-15 DIAGNOSIS — Z122 Encounter for screening for malignant neoplasm of respiratory organs: Secondary | ICD-10-CM

## 2022-11-18 DIAGNOSIS — H2511 Age-related nuclear cataract, right eye: Secondary | ICD-10-CM | POA: Diagnosis not present

## 2022-11-18 DIAGNOSIS — H2512 Age-related nuclear cataract, left eye: Secondary | ICD-10-CM | POA: Diagnosis not present

## 2022-11-20 ENCOUNTER — Other Ambulatory Visit: Payer: Self-pay | Admitting: Family Medicine

## 2022-11-20 DIAGNOSIS — F5104 Psychophysiologic insomnia: Secondary | ICD-10-CM

## 2022-11-20 NOTE — Telephone Encounter (Unsigned)
Copied from CRM [id number]. Topic: General - Other >> Nov 20, 2022 11:31 AM Everette C wrote: Reason for CRM: Medication Refill - Medication: hydrOXYzine (VISTARIL) 25 MG capsule [[id number]]  Has the patient contacted their pharmacy? Yes.   (Agent: If no, request that the patient contact the pharmacy for the refill. If patient does not wish to contact the pharmacy document the reason why and proceed with request.) (Agent: If yes, when and what did the pharmacy advise?)  Preferred Pharmacy (with phone number or street name): [facility] - [place], [place] - [address] [place] [address] Phone: [phone] Fax: [phone] Hours: Not open 24 hours   Has the patient been seen for an appointment in the last year OR does the patient have an upcoming appointment? Yes.    Agent: Please be advised that RX refills may take up to 3 business days. We ask that you follow-up with your pharmacy.

## 2022-11-21 MED ORDER — HYDROXYZINE PAMOATE 25 MG PO CAPS
25.0000 mg | ORAL_CAPSULE | Freq: Every day | ORAL | 2 refills | Status: DC | PRN
Start: 2022-11-21 — End: 2023-02-20

## 2022-11-21 NOTE — Telephone Encounter (Signed)
Requested medication (s) are due for refill today - no  Requested medication (s) are on the active medication list -yes  Future visit scheduled -yes  Last refill: 11/24/19  Notes to clinic: historical medication- sent for review of request  Requested Prescriptions  Pending Prescriptions Disp Refills   hydrOXYzine (VISTARIL) 25 MG capsule 30 capsule     Sig: Take 1 capsule (25 mg total) by mouth daily as needed.     Ear, Nose, and Throat:  Antihistamines 2 Passed - 11/20/2022 11:50 AM      Passed - Cr in normal range and within 360 days    Creat  Date Value Ref Range Status  09/19/2022 0.80 0.50 - 1.05 mg/dL Final         Passed - Valid encounter within last 12 months    Recent Outpatient Visits           1 month ago Centrilobular emphysema [facility])   [place] [facility], Netta Neat, DO   5 months ago Centrilobular emphysema [facility])   [place] [facility] Delles, [name], RPH-CPP   7 months ago Centrilobular emphysema [facility])   [place] [facility] Delles, [name], RPH-CPP   7 months ago Centrilobular emphysema [facility])   [place] [facility] Smitty Cords, DO   1 year ago Annual physical exam   [place] [facility] Smitty Cords, DO       Future Appointments             In 4 months Althea Charon, Netta Neat, DO [place] [facility], [facility]               Requested Prescriptions  Pending Prescriptions Disp Refills   hydrOXYzine (VISTARIL) 25 MG capsule 30 capsule     Sig: Take 1 capsule (25 mg total) by mouth daily as needed.     Ear, Nose, and Throat:  Antihistamines 2 Passed - 11/20/2022 11:50 AM      Passed - Cr in normal range and within 360 days    Creat  Date Value Ref Range Status  09/19/2022 0.80 0.50 - 1.05 mg/dL Final         Passed - Valid encounter within last 12 months    Recent Outpatient Visits            1 month ago Centrilobular emphysema [facility])   [place] [facility] [place], Netta Neat, DO   5 months ago Centrilobular emphysema [facility])   [place] [facility] Delles, [name], RPH-CPP   7 months ago Centrilobular emphysema [facility])   [place] [facility] Delles, [name], RPH-CPP   7 months ago Centrilobular emphysema [facility])   [place] [facility] Smitty Cords, DO   1 year ago Annual physical exam   [place] [facility] Smitty Cords, DO       Future Appointments             In 4 months Althea Charon, Netta Neat, DO [facility], [facility]

## 2022-11-21 NOTE — Discharge Instructions (Signed)

## 2022-11-25 ENCOUNTER — Encounter: Payer: Self-pay | Admitting: Ophthalmology

## 2022-11-25 ENCOUNTER — Ambulatory Visit
Admission: RE | Admit: 2022-11-25 | Discharge: 2022-11-25 | Disposition: A | Discharge status: Home / Self Care | Payer: Medicare Other | Source: Ambulatory Visit | Attending: Ophthalmology | Admitting: Ophthalmology

## 2022-11-25 ENCOUNTER — Other Ambulatory Visit: Payer: Self-pay

## 2022-11-25 ENCOUNTER — Encounter
Admission: RE | Disposition: A | Discharge status: Home / Self Care | Payer: Self-pay | Source: Ambulatory Visit | Attending: Ophthalmology

## 2022-11-25 ENCOUNTER — Ambulatory Visit: Payer: Medicare Other | Admitting: Anesthesiology

## 2022-11-25 DIAGNOSIS — G709 Myoneural disorder, unspecified: Secondary | ICD-10-CM | POA: Diagnosis not present

## 2022-11-25 DIAGNOSIS — H2511 Age-related nuclear cataract, right eye: Secondary | ICD-10-CM | POA: Insufficient documentation

## 2022-11-25 DIAGNOSIS — I10 Essential (primary) hypertension: Secondary | ICD-10-CM | POA: Insufficient documentation

## 2022-11-25 DIAGNOSIS — E039 Hypothyroidism, unspecified: Secondary | ICD-10-CM | POA: Diagnosis not present

## 2022-11-25 DIAGNOSIS — Z9981 Dependence on supplemental oxygen: Secondary | ICD-10-CM | POA: Insufficient documentation

## 2022-11-25 DIAGNOSIS — F419 Anxiety disorder, unspecified: Secondary | ICD-10-CM | POA: Insufficient documentation

## 2022-11-25 DIAGNOSIS — F32A Depression, unspecified: Secondary | ICD-10-CM | POA: Diagnosis not present

## 2022-11-25 DIAGNOSIS — Z87891 Personal history of nicotine dependence: Secondary | ICD-10-CM | POA: Diagnosis not present

## 2022-11-25 DIAGNOSIS — M1990 Unspecified osteoarthritis, unspecified site: Secondary | ICD-10-CM | POA: Diagnosis not present

## 2022-11-25 DIAGNOSIS — J449 Chronic obstructive pulmonary disease, unspecified: Secondary | ICD-10-CM | POA: Insufficient documentation

## 2022-11-25 HISTORY — PX: CATARACT EXTRACTION W/PHACO: SHX586

## 2022-11-25 HISTORY — DX: Chronic pain syndrome: G89.4

## 2022-11-25 SURGERY — PHACOEMULSIFICATION, CATARACT, WITH IOL INSERTION
Anesthesia: Monitor Anesthesia Care | Site: Eye | Laterality: Right

## 2022-11-25 MED ORDER — LACTATED RINGERS IV SOLN: INTRAVENOUS | Status: DC

## 2022-11-25 MED ORDER — FENTANYL CITRATE (PF) 100 MCG/2ML IJ SOLN
INTRAMUSCULAR | Status: DC | PRN
  Administered 2022-11-25 (×2): 50 ug via INTRAVENOUS

## 2022-11-25 MED ORDER — SIGHTPATH DOSE#1 BSS IO SOLN
INTRAOCULAR | Status: DC | PRN
  Administered 2022-11-25: 96 mL via OPHTHALMIC

## 2022-11-25 MED ORDER — LIDOCAINE HCL (PF) 2 % IJ SOLN
INTRAOCULAR | Status: DC | PRN
  Administered 2022-11-25: 1 mL via INTRAOCULAR

## 2022-11-25 MED ORDER — TETRACAINE HCL 0.5 % OP SOLN: 1.0000 [drp] | OPHTHALMIC | Status: DC | PRN

## 2022-11-25 MED ORDER — SIGHTPATH DOSE#1 BSS IO SOLN
INTRAOCULAR | Status: DC | PRN
  Administered 2022-11-25: 15 mL

## 2022-11-25 MED ORDER — TETRACAINE HCL 0.5 % OP SOLN
1.0000 [drp] | OPHTHALMIC | Status: DC | PRN
  Administered 2022-11-25 (×3): 1 [drp] via OPHTHALMIC

## 2022-11-25 MED ORDER — ARMC OPHTHALMIC DILATING DROPS
1.0000 | OPHTHALMIC | Status: DC | PRN
  Administered 2022-11-25 (×3): 1 via OPHTHALMIC

## 2022-11-25 MED ORDER — MOXIFLOXACIN HCL 0.5 % OP SOLN
OPHTHALMIC | Status: DC | PRN
  Administered 2022-11-25: .2 mL via OPHTHALMIC

## 2022-11-25 MED ORDER — SIGHTPATH DOSE#1 NA HYALUR & NA CHOND-NA HYALUR IO KIT
PACK | INTRAOCULAR | Status: DC | PRN
  Administered 2022-11-25: 1 via OPHTHALMIC

## 2022-11-25 MED ORDER — ARMC OPHTHALMIC DILATING DROPS: 1.0000 | OPHTHALMIC | Status: DC | PRN

## 2022-11-25 MED ORDER — MIDAZOLAM HCL 2 MG/2ML IJ SOLN
INTRAMUSCULAR | Status: DC | PRN
  Administered 2022-11-25: 2 mg via INTRAVENOUS

## 2022-11-25 SURGICAL SUPPLY — 11 items
CATARACT SUITE SIGHTPATH (MISCELLANEOUS) ×1
DISSECTOR HYDRO NUCLEUS 50X22 (MISCELLANEOUS) ×1 IMPLANT
FEE CATARACT SUITE SIGHTPATH (MISCELLANEOUS) ×1 IMPLANT
GLOVE SURG GAMMEX PI TX LF 7.5 (GLOVE) ×1 IMPLANT
GLOVE SURG SYN 8.5 E (GLOVE) ×1
GLOVE SURG SYN 8.5 PF PI (GLOVE) ×1 IMPLANT
LENS IOL TECNIS EYHANCE 21.0 (Intraocular Lens) IMPLANT
NDL FILTER BLUNT 18X1 1/2 (NEEDLE) ×1 IMPLANT
NEEDLE FILTER BLUNT 18X1 1/2 (NEEDLE) ×1
SYR 3ML LL SCALE MARK (SYRINGE) ×1 IMPLANT
SYR 5ML LL (SYRINGE) ×1 IMPLANT

## 2022-11-25 NOTE — Anesthesia Preprocedure Evaluation (Addendum)
Anesthesia Evaluation  Patient identified by MRN, date of birth, ID band Patient awake    Reviewed: Allergy & Precautions, H&P , NPO status , Patient's Chart, lab work & pertinent test results  Airway Mallampati: III  TM Distance: <3 FB Neck ROM: Full    Dental no notable dental hx. (+) Partial Upper   Pulmonary COPD, Patient abstained from smoking., former smoker  Patient states she took albuterol inhaler today. Wears oxygen 2 L/m/n/c at night, but not in daytime. Says she is "breathing fine."   + rhonchi        Cardiovascular hypertension, Normal cardiovascular exam Rhythm:Regular Rate:Normal     Neuro/Psych  PSYCHIATRIC DISORDERS Anxiety Depression     Neuromuscular disease negative neurological ROS  negative psych ROS   GI/Hepatic negative GI ROS, Neg liver ROS,,,  Endo/Other  negative endocrine ROSHypothyroidism    Renal/GU negative Renal ROS  negative genitourinary   Musculoskeletal negative musculoskeletal ROS (+) Arthritis ,    Abdominal   Peds negative pediatric ROS (+)  Hematology negative hematology ROS (+)   Anesthesia Other Findings Anxiety  Arthritis Hypothyroidism  Plantar fasciitis Hyperlipidemia  Hypertension COPD (chronic obstructive pulmonary disease)   On home oxygen therapy Pulmonologist requested patient decrease marijuana use     Reproductive/Obstetrics negative OB ROS                              Anesthesia Physical Anesthesia Plan  ASA: 4  Anesthesia Plan: MAC   Post-op Pain Management:    Induction: Intravenous  PONV Risk Score and Plan:   Airway Management Planned: Natural Airway and Nasal Cannula  Additional Equipment:   Intra-op Plan:   Post-operative Plan:   Informed Consent: I have reviewed the patients History and Physical, chart, labs and discussed the procedure including the risks, benefits and alternatives for the proposed  anesthesia with the patient or authorized representative who has indicated his/her understanding and acceptance.     Dental Advisory Given  Plan Discussed with: Anesthesiologist, CRNA and Surgeon  Anesthesia Plan Comments: (Patient consented for risks of anesthesia including but not limited to:  - adverse reactions to medications - damage to eyes, teeth, lips or other oral mucosa - nerve damage due to positioning  - sore throat or hoarseness - Damage to heart, brain, nerves, lungs, other parts of body or loss of life  Patient voiced understanding.)         Anesthesia Quick Evaluation

## 2022-11-25 NOTE — H&P (Signed)
[facility]   Primary Care Physician:  Smitty Cords, DO Ophthalmologist: Dr. Willey Blade  Pre-Procedure History & Physical: HPI:  Audrey Campbell is a 67 y.o. female here for cataract surgery.   Past Medical History:  Diagnosis Date   Anxiety    Arthritis    Chronic pain syndrome    COPD (chronic obstructive pulmonary disease) (HCC)    Hyperlipidemia    Hypertension    Hypothyroidism    On home oxygen therapy    Plantar fasciitis     Past Surgical History:  Procedure Laterality Date   bulging disc in the neck     COLONOSCOPY WITH PROPOFOL N/A 12/29/2019   Procedure: COLONOSCOPY WITH PROPOFOL;  Surgeon: Pasty Spillers, MD;  Location: [facility] ENDOSCOPY;  Service: Endoscopy;  Laterality: N/A;   COLONOSCOPY WITH PROPOFOL N/A 01/24/2021   Procedure: COLONOSCOPY WITH PROPOFOL;  Surgeon: Pasty Spillers, MD;  Location: [facility] ENDOSCOPY;  Service: Endoscopy;  Laterality: N/A;   DILATION AND CURETTAGE OF UTERUS     ESOPHAGOGASTRODUODENOSCOPY (EGD) WITH PROPOFOL N/A 12/29/2019   Procedure: ESOPHAGOGASTRODUODENOSCOPY (EGD) WITH PROPOFOL;  Surgeon: Pasty Spillers, MD;  Location: [facility] ENDOSCOPY;  Service: Endoscopy;  Laterality: N/A;   FOOT SURGERY Bilateral    heel  fasciitis     RHINOPLASTY     SHOULDER SURGERY     TUBAL LIGATION      Prior to Admission medications   Medication Sig Start Date End Date Taking? Authorizing Provider  albuterol (PROVENTIL) (2.5 MG/3ML) 0.083% nebulizer solution USE 1 VIAL IN NEBULIZER 4 TIMES DAILY - And As Needed 11/11/22  Yes Baity, Salvadore Oxford, NP  albuterol (VENTOLIN HFA) 108 (90 Base) MCG/ACT inhaler INHALE 2 PUFFS INTO THE LUNGS EVERY 4 HOURS AS NEEDED FOR WHEEZING OR SHORTNESS OF BREATH 09/25/22  Yes Karamalegos, Netta Neat, DO  BREZTRI AEROSPHERE 160-9-4.8 MCG/ACT AERO INHALE 2 PUFFS BY MOUTH INTO THE LUNGS IN THE MORNING AND AT BEDTIME 06/06/20  Yes Gabriel Cirri, NP  butalbital-acetaminophen-caffeine (FIORICET)  [phone] MG tablet Take 1 tablet by mouth every 6 (six) hours as needed for headache. 06/17/22  Yes Karamalegos, Netta Neat, DO  diclofenac Sodium (VOLTAREN) 1 % GEL Apply 2 g topically 4 (four) times daily as needed (arthritis knee pain). 09/26/22  Yes Karamalegos, Netta Neat, DO  fluticasone (FLONASE) 50 MCG/ACT nasal spray Place 2 sprays into both nostrils daily. 11/21/20  Yes Karamalegos, Netta Neat, DO  gabapentin (NEURONTIN) 400 MG capsule TAKE 1 CAPSULE BY MOUTH 4 TIMES DAILY 09/26/22  Yes Karamalegos, Netta Neat, DO  hydrOXYzine (VISTARIL) 25 MG capsule Take 1 capsule (25 mg total) by mouth daily as needed. 11/21/22  Yes Karamalegos, Netta Neat, DO  levothyroxine (SYNTHROID) 100 MCG tablet Take 1 tablet (100 mcg total) by mouth daily before breakfast. 09/26/22  Yes Karamalegos, Alexander J, DO  montelukast (SINGULAIR) 10 MG tablet TAKE 1 TABLET BY MOUTH DAILY 09/04/22  Yes Karamalegos, Netta Neat, DO  Oxycodone HCl 10 MG TABS Take 10 mg by mouth 4 (four) times daily as needed. 01/22/22  Yes [provider]  polyethylene glycol (MIRALAX / GLYCOLAX) 17 g packet Take 17 g by mouth daily as needed.   Yes [provider]  QUEtiapine (SEROQUEL) 100 MG tablet Take 2 tablets (200 mg total) by mouth at bedtime. 09/26/22  Yes Karamalegos, Netta Neat, DO  albuterol (ACCUNEB) 0.63 MG/3ML nebulizer solution Take 1 ampule by nebulization every 6 (six) hours as needed for wheezing. Patient not taking: Reported on 11/15/2022  [provider]    Allergies as of 11/11/2022 - Review Complete 09/26/2022  Allergen Reaction Noted   Sulfa antibiotics  06/23/2020   Penicillins Rash and Other (See Comments) 02/23/2013    Family History  Problem Relation Age of Onset   Diabetes Father    Stroke Father 19   Thyroid disease Father    Seizures Sister    Breast cancer Neg Hx     Social History   Socioeconomic History   Marital status: Widowed    Spouse name: Not on file   Number  of children: Not on file   Years of education: high school   Highest education level: High school graduate  Occupational History   Not on file  Tobacco Use   Smoking status: Former    Current packs/day: 0.00    Average packs/day: 0.5 packs/day for 50.3 years (25.1 ttl pk-yrs)    Types: Cigarettes    Start date: 04/29/1972    Quit date: 08/01/2022    Years since quitting: 0.3   Smokeless tobacco: Never  Vaping Use   Vaping status: Never Used  Substance and Sexual Activity   Alcohol use: No   Drug use: Yes    Types: Marijuana   Sexual activity: Yes    Birth control/protection: None  Other Topics Concern   Not on file  Social History Narrative   Not on file   Social Determinants of Health   Financial Resource Strain: Low Risk  (03/01/2022)   Overall Financial Resource Strain (CARDIA)    Difficulty of Paying Living Expenses: Not hard at all  Food Insecurity: No Food Insecurity (03/01/2022)   Hunger Vital Sign    Worried About Running Out of Food in the Last Year: Never true    Ran Out of Food in the Last Year: Never true  Transportation Needs: No Transportation Needs (03/01/2022)   PRAPARE - Administrator, Civil Service (Medical): No    Lack of Transportation (Non-Medical): No  Physical Activity: Inactive (03/01/2022)   Exercise Vital Sign    Days of Exercise per Week: 0 days    Minutes of Exercise per Session: 0 min  Stress: No Stress Concern Present (03/01/2022)   [employer] of Occupational Health - Occupational Stress Questionnaire    Feeling of Stress : Only a little  Social Connections: Moderately Integrated (03/01/2022)   Social Connection and Isolation Panel [NHANES]    Frequency of Communication with Friends and Family: More than three times a week    Frequency of Social Gatherings with Friends and Family: More than three times a week    Attends Religious Services: More than 4 times per year    Active Member of [employer] or Organizations: No    Attends  Banker Meetings: Never    Marital Status: Married  Catering manager Violence: Not At Risk (03/01/2022)   Humiliation, Afraid, Rape, and Kick questionnaire    Fear of Current or Ex-Partner: No    Emotionally Abused: No    Physically Abused: No    Sexually Abused: No    Review of Systems: See HPI, otherwise negative ROS  Physical Exam: BP (!) 147/98   Pulse 72   Temp (!) 97 F (36.1 C) (Temporal)   Resp 12   Ht 5\' 4"  (1.626 m)   Wt 76.2 kg   SpO2 95%   BMI 28.82 kg/m  General:   Alert, cooperative in NAD Head:  Normocephalic and atraumatic. Respiratory:  Normal work  of breathing. Cardiovascular:  RRR  Impression/Plan: Audrey Campbell is here for cataract surgery.  Risks, benefits, limitations, and alternatives regarding cataract surgery have been reviewed with the patient.  Questions have been answered.  All parties agreeable.   Willey Blade, MD  11/25/2022, 8:46 AM

## 2022-11-25 NOTE — Anesthesia Postprocedure Evaluation (Signed)
Anesthesia Post Note  Patient: Audrey Campbell  Procedure(s) Performed: CATARACT EXTRACTION PHACO AND INTRAOCULAR LENS PLACEMENT (IOC) RIGHT  11.84  01:01.5 (Right: Eye)  Patient location during evaluation: PACU Anesthesia Type: MAC Level of consciousness: awake and alert Pain management: pain level controlled Vital Signs Assessment: post-procedure vital signs reviewed and stable Respiratory status: spontaneous breathing, nonlabored ventilation, respiratory function stable and patient connected to nasal cannula oxygen Cardiovascular status: stable and blood pressure returned to baseline Postop Assessment: no apparent nausea or vomiting Anesthetic complications: no   No notable events documented.   Last Vitals:  Vitals:   11/25/22 0912 11/25/22 0918  BP: 138/85 (!) 140/86  Pulse: 69 73  Resp: 20 19  Temp: 36.6 C (!) 36.4 C  SpO2: 99% 95%    Last Pain:  Vitals:   11/25/22 0918  TempSrc:   PainSc: 0-No pain                 Marisue Humble

## 2022-11-25 NOTE — Op Note (Signed)
OPERATIVE NOTE  Audrey Campbell [id number] 11/25/2022   PREOPERATIVE DIAGNOSIS:  Nuclear sclerotic cataract right eye.  H25.11   POSTOPERATIVE DIAGNOSIS:    Nuclear sclerotic cataract right eye.     PROCEDURE:  Phacoemusification with posterior chamber intraocular lens placement of the right eye   LENS:   Implant Name Type Inv. Item Serial No. Manufacturer Lot No. LRB No. Used Action  LENS IOL TECNIS EYHANCE 21.0 - [id number] Intraocular Lens LENS IOL TECNIS EYHANCE 21.0 [id number] [employer]  Right 1 Implanted       Procedure(s): CATARACT EXTRACTION PHACO AND INTRAOCULAR LENS PLACEMENT (IOC) RIGHT  11.84  01:01.5 (Right)  DIB00 +21.0   ULTRASOUND TIME: 1 minutes 01 seconds.  CDE 11.84   SURGEON:  Willey Blade, MD, MPH  ANESTHESIOLOGIST: Anesthesiologist: Marisue Humble, MD CRNA: Barbette Hair, CRNA   ANESTHESIA:  Topical with tetracaine drops augmented with 1% preservative-free intracameral lidocaine.  ESTIMATED BLOOD LOSS: less than 1 mL.   COMPLICATIONS:  None.   DESCRIPTION OF PROCEDURE:  The patient was identified in the holding room and transported to the operating room and placed in the supine position under the operating microscope.  The right eye was identified as the operative eye and it was prepped and draped in the usual sterile ophthalmic fashion.   A 1.0 millimeter clear-corneal paracentesis was made at the 10:30 position. 0.5 ml of preservative-free 1% lidocaine with epinephrine was injected into the anterior chamber.  The anterior chamber was filled with viscoelastic.  A 2.4 millimeter keratome was used to make a near-clear corneal incision at the 8:00 position.  A curvilinear capsulorrhexis was made with a cystotome and capsulorrhexis forceps.  Balanced salt solution was used to hydrodissect and hydrodelineate the nucleus.   Phacoemulsification was then used in stop and chop fashion to remove the lens nucleus and epinucleus.  The remaining cortex was then  removed using the irrigation and aspiration handpiece. Viscoelastic was then placed into the capsular bag to distend it for lens placement.  A lens was then injected into the capsular bag.  The remaining viscoelastic was aspirated.   Wounds were hydrated with balanced salt solution.  The anterior chamber was inflated to a physiologic pressure with balanced salt solution.   Intracameral vigamox 0.1 mL undiluted was injected into the eye and a drop placed onto the ocular surface.  No wound leaks were noted.  The patient was taken to the recovery room in stable condition without complications of anesthesia or surgery  Willey Blade 11/25/2022, 9:11 AM

## 2022-11-25 NOTE — Transfer of Care (Signed)
Immediate Anesthesia Transfer of Care Note  Patient: Audrey Campbell  Procedure(s) Performed: CATARACT EXTRACTION PHACO AND INTRAOCULAR LENS PLACEMENT (IOC) RIGHT  11.84  01:01.5 (Right: Eye)  Patient Location: PACU  Anesthesia Type: MAC  Level of Consciousness: awake, alert  and patient cooperative  Airway and Oxygen Therapy: Patient Spontanous Breathing and Patient connected to supplemental oxygen  Post-op Assessment: Post-op Vital signs reviewed, Patient's Cardiovascular Status Stable, Respiratory Function Stable, Patent Airway and No signs of Nausea or vomiting  Post-op Vital Signs: Reviewed and stable  Complications: No notable events documented.

## 2022-11-26 ENCOUNTER — Encounter: Payer: Self-pay | Admitting: Ophthalmology

## 2022-11-26 DIAGNOSIS — H2512 Age-related nuclear cataract, left eye: Secondary | ICD-10-CM | POA: Diagnosis not present

## 2022-11-26 NOTE — Anesthesia Preprocedure Evaluation (Addendum)
Anesthesia Evaluation  Patient identified by MRN, date of birth, ID band Patient awake    Reviewed: Allergy & Precautions, H&P , NPO status , Patient's Chart, lab work & pertinent test results  Airway Mallampati: III  TM Distance: <3 FB Neck ROM: Full    Dental no notable dental hx.    Pulmonary COPD, Current Smoker and Patient abstained from smoking., former smoker Patient states she took albuterol inhaler today. Wears oxygen 2 L/m/n/c at night, but not in daytime.    Pulmonary exam normal breath sounds clear to auscultation       Cardiovascular hypertension, Normal cardiovascular exam Rhythm:Regular Rate:Normal     Neuro/Psych  PSYCHIATRIC DISORDERS Anxiety Depression     Neuromuscular disease negative neurological ROS  negative psych ROS   GI/Hepatic negative GI ROS, Neg liver ROS,,,  Endo/Other  negative endocrine ROSHypothyroidism    Renal/GU negative Renal ROS  negative genitourinary   Musculoskeletal negative musculoskeletal ROS (+) Arthritis ,    Abdominal   Peds negative pediatric ROS (+)  Hematology negative hematology ROS (+)   Anesthesia Other Findings Anxiety  Arthritis Hypothyroidism  Plantar fasciitis Hyperlipidemia  Hypertension Severe COPD   On home oxygen therapy 2 L/m/n/c at night Chronic pain syndrome   Previous cataract surgery 11-25-22    Reproductive/Obstetrics negative OB ROS                              Anesthesia Physical Anesthesia Plan  ASA: 4  Anesthesia Plan: MAC   Post-op Pain Management:    Induction: Intravenous  PONV Risk Score and Plan:   Airway Management Planned: Natural Airway and Nasal Cannula  Additional Equipment:   Intra-op Plan:   Post-operative Plan:   Informed Consent: I have reviewed the patients History and Physical, chart, labs and discussed the procedure including the risks, benefits and alternatives for the  proposed anesthesia with the patient or authorized representative who has indicated his/her understanding and acceptance.     Dental Advisory Given  Plan Discussed with: Anesthesiologist, CRNA and Surgeon  Anesthesia Plan Comments: (Patient consented for risks of anesthesia including but not limited to:  - adverse reactions to medications - damage to eyes, teeth, lips or other oral mucosa - nerve damage due to positioning  - sore throat or hoarseness - Damage to heart, brain, nerves, lungs, other parts of body or loss of life  Patient voiced understanding.)         Anesthesia Quick Evaluation

## 2022-11-27 ENCOUNTER — Other Ambulatory Visit: Payer: Self-pay | Admitting: Family Medicine

## 2022-11-27 DIAGNOSIS — J432 Centrilobular emphysema: Secondary | ICD-10-CM

## 2022-11-28 NOTE — Telephone Encounter (Signed)
Requested Prescriptions  Pending Prescriptions Disp Refills   albuterol (VENTOLIN HFA) 108 (90 Base) MCG/ACT inhaler [Pharmacy Med Name: ALBUTEROL SULFATE HFA 108 (90 BASE)] 8.5 g 0    Sig: INHALE 2 PUFFS INTO THE LUNGS EVERY 4 HOURS AS NEEDED FOR WHEEZING OR SHORTNESS OF BREATH     Pulmonology:  Beta Agonists 2 Failed - 11/27/2022  9:16 AM      Failed - Last BP in normal range    BP Readings from Last 1 Encounters:  11/25/22 (!) 140/86         Passed - Last Heart Rate in normal range    Pulse Readings from Last 1 Encounters:  11/25/22 73         Passed - Valid encounter within last 12 months    Recent Outpatient Visits           2 months ago Centrilobular emphysema [facility])   [place] [facility] [place], Netta Neat, DO   5 months ago Centrilobular emphysema [facility])   [place] [facility] Delles, [name], RPH-CPP   7 months ago Centrilobular emphysema [facility])   [place] [facility] Delles, [name], RPH-CPP   8 months ago Centrilobular emphysema [facility])   [place] [facility] Smitty Cords, DO   1 year ago Annual physical exam   [place] [facility] Smitty Cords, DO       Future Appointments             In 4 months Althea Charon, Netta Neat, DO  [facility]

## 2022-11-29 ENCOUNTER — Encounter: Payer: Self-pay | Admitting: Ophthalmology

## 2022-12-05 DIAGNOSIS — M542 Cervicalgia: Secondary | ICD-10-CM | POA: Diagnosis not present

## 2022-12-05 DIAGNOSIS — M25519 Pain in unspecified shoulder: Secondary | ICD-10-CM | POA: Diagnosis not present

## 2022-12-05 DIAGNOSIS — M545 Low back pain, unspecified: Secondary | ICD-10-CM | POA: Diagnosis not present

## 2022-12-05 DIAGNOSIS — G894 Chronic pain syndrome: Secondary | ICD-10-CM | POA: Diagnosis not present

## 2022-12-05 DIAGNOSIS — M722 Plantar fascial fibromatosis: Secondary | ICD-10-CM | POA: Diagnosis not present

## 2022-12-05 DIAGNOSIS — M25569 Pain in unspecified knee: Secondary | ICD-10-CM | POA: Diagnosis not present

## 2022-12-05 DIAGNOSIS — Z79891 Long term (current) use of opiate analgesic: Secondary | ICD-10-CM | POA: Diagnosis not present

## 2022-12-05 NOTE — Discharge Instructions (Signed)

## 2022-12-12 ENCOUNTER — Telehealth: Payer: Medicare Other | Admitting: Physician Assistant

## 2022-12-12 ENCOUNTER — Ambulatory Visit: Payer: Self-pay | Admitting: *Deleted

## 2022-12-12 DIAGNOSIS — B9689 Other specified bacterial agents as the cause of diseases classified elsewhere: Secondary | ICD-10-CM

## 2022-12-12 DIAGNOSIS — J019 Acute sinusitis, unspecified: Secondary | ICD-10-CM | POA: Diagnosis not present

## 2022-12-12 MED ORDER — AZITHROMYCIN 250 MG PO TABS
ORAL_TABLET | ORAL | 0 refills | Status: AC
Start: 2022-12-12 — End: 2022-12-17

## 2022-12-12 NOTE — Telephone Encounter (Signed)
FYI pt had a VV today.

## 2022-12-12 NOTE — Patient Instructions (Signed)
Kris Mouton, thank you for joining Margaretann Loveless, PA-C for today's virtual visit.  While this provider is not your primary care provider (PCP), if your PCP is located in our provider database this encounter information will be shared with them immediately following your visit.   A [facility] MyChart account gives you access to today's visit and all your visits, tests, and labs performed at [facility] " click here if you don't have a [facility] MyChart account or go to [email or web address]  Consent: (Patient) MAKYNLEIGH HOUX provided verbal consent for this virtual visit at the beginning of the encounter.  Current Medications:  Current Outpatient Medications:    azithromycin (ZITHROMAX) 250 MG tablet, Take 2 tablets on day 1, then 1 tablet daily on days 2 through 5, Disp: 6 tablet, Rfl: 0   albuterol (ACCUNEB) 0.63 MG/3ML nebulizer solution, Take 1 ampule by nebulization every 6 (six) hours as needed for wheezing. (Patient not taking: Reported on 11/15/2022), Disp: , Rfl:    albuterol (PROVENTIL) (2.5 MG/3ML) 0.083% nebulizer solution, USE 1 VIAL IN NEBULIZER 4 TIMES DAILY - And As Needed, Disp: 120 mL, Rfl: 0   albuterol (VENTOLIN HFA) 108 (90 Base) MCG/ACT inhaler, INHALE 2 PUFFS INTO THE LUNGS EVERY 4 HOURS AS NEEDED FOR WHEEZING OR SHORTNESS OF BREATH, Disp: 8.5 g, Rfl: 0   BREZTRI AEROSPHERE 160-9-4.8 MCG/ACT AERO, INHALE 2 PUFFS BY MOUTH INTO THE LUNGS IN THE MORNING AND AT BEDTIME, Disp: 10.7 g, Rfl: 2   butalbital-acetaminophen-caffeine (FIORICET) 50-325-40 MG tablet, Take 1 tablet by mouth every 6 (six) hours as needed for headache., Disp: 30 tablet, Rfl: 2   diclofenac Sodium (VOLTAREN) 1 % GEL, Apply 2 g topically 4 (four) times daily as needed (arthritis knee pain)., Disp: 100 g, Rfl: 2   fluticasone (FLONASE) 50 MCG/ACT nasal spray, Place 2 sprays into both nostrils daily., Disp: 16 g, Rfl: 3   gabapentin (NEURONTIN) 400 MG capsule, TAKE 1 CAPSULE BY  MOUTH 4 TIMES DAILY, Disp: 360 capsule, Rfl: 3   hydrOXYzine (VISTARIL) 25 MG capsule, Take 1 capsule (25 mg total) by mouth daily as needed., Disp: 30 capsule, Rfl: 2   levothyroxine (SYNTHROID) 100 MCG tablet, Take 1 tablet (100 mcg total) by mouth daily before breakfast., Disp: 90 tablet, Rfl: 3   montelukast (SINGULAIR) 10 MG tablet, TAKE 1 TABLET BY MOUTH DAILY, Disp: 90 tablet, Rfl: 1   Oxycodone HCl 10 MG TABS, Take 10 mg by mouth 4 (four) times daily as needed., Disp: , Rfl:    polyethylene glycol (MIRALAX / GLYCOLAX) 17 g packet, Take 17 g by mouth daily as needed., Disp: , Rfl:    QUEtiapine (SEROQUEL) 100 MG tablet, Take 2 tablets (200 mg total) by mouth at bedtime., Disp: 180 tablet, Rfl: 1   Medications ordered in this encounter:  Meds ordered this encounter  Medications   azithromycin (ZITHROMAX) 250 MG tablet    Sig: Take 2 tablets on day 1, then 1 tablet daily on days 2 through 5    Dispense:  6 tablet    Refill:  0    Order Specific Question:   Supervising Provider    Answer:   Merrilee Jansky [[id number]]     *If you need refills on other medications prior to your next appointment, please contact your pharmacy*  Follow-Up: Call back or seek an in-person evaluation if the symptoms worsen or if the condition fails to improve as anticipated.  [facility] [phone]  Other Instructions Sinus Infection, Adult A sinus infection, also called sinusitis, is inflammation of your sinuses. Sinuses are hollow spaces in the bones around your face. Your sinuses are located: Around your eyes. In the middle of your forehead. Behind your nose. In your cheekbones. Mucus normally drains out of your sinuses. When your nasal tissues become inflamed or swollen, mucus can become trapped or blocked. This allows bacteria, viruses, and fungi to grow, which leads to infection. Most infections of the sinuses are caused by a virus. A sinus infection can develop quickly. It can  last for up to 4 weeks (acute) or for more than 12 weeks (chronic). A sinus infection often develops after a cold. What are the causes? This condition is caused by anything that creates swelling in the sinuses or stops mucus from draining. This includes: Allergies. Asthma. Infection from bacteria or viruses. Deformities or blockages in your nose or sinuses. Abnormal growths in the nose (nasal polyps). Pollutants, such as chemicals or irritants in the air. Infection from fungi. This is rare. What increases the risk? You are more likely to develop this condition if you: Have a weak body defense system (immune system). Do a lot of swimming or diving. Overuse nasal sprays. Smoke. What are the signs or symptoms? The main symptoms of this condition are pain and a feeling of pressure around the affected sinuses. Other symptoms include: Stuffy nose or congestion that makes it difficult to breathe through your nose. Thick yellow or greenish drainage from your nose. Tenderness, swelling, and warmth over the affected sinuses. A cough that may get worse at night. Decreased sense of smell and taste. Extra mucus that collects in the throat or the back of the nose (postnasal drip) causing a sore throat or bad breath. Tiredness (fatigue). Fever. How is this diagnosed? This condition is diagnosed based on: Your symptoms. Your medical history. A physical exam. Tests to find out if your condition is acute or chronic. This may include: Checking your nose for nasal polyps. Viewing your sinuses using a device that has a light (endoscope). Testing for allergies or bacteria. Imaging tests, such as an MRI or CT scan. In rare cases, a bone biopsy may be done to rule out more serious types of fungal sinus disease. How is this treated? Treatment for a sinus infection depends on the cause and whether your condition is chronic or acute. If caused by a virus, your symptoms should go away on their own within  10 days. You may be given medicines to relieve symptoms. They include: Medicines that shrink swollen nasal passages (decongestants). A spray that eases inflammation of the nostrils (topical intranasal corticosteroids). Rinses that help get rid of thick mucus in your nose (nasal saline washes). Medicines that treat allergies (antihistamines). Over-the-counter pain relievers. If caused by bacteria, your health care provider may recommend waiting to see if your symptoms improve. Most bacterial infections will get better without antibiotic medicine. You may be given antibiotics if you have: A severe infection. A weak immune system. If caused by narrow nasal passages or nasal polyps, surgery may be needed. Follow these instructions at home: Medicines Take, use, or apply over-the-counter and prescription medicines only as told by your health care provider. These may include nasal sprays. If you were prescribed an antibiotic medicine, take it as told by your health care provider. Do not stop taking the antibiotic even if you start to feel better. Hydrate and humidify  Drink enough fluid to keep your urine pale yellow. Staying  hydrated will help to thin your mucus. Use a cool mist humidifier to keep the humidity level in your home above 50%. Inhale steam for 10-15 minutes, 3-4 times a day, or as told by your health care provider. You can do this in the bathroom while a hot shower is running. Limit your exposure to cool or dry air. Rest Rest as much as possible. Sleep with your head raised (elevated). Make sure you get enough sleep each night. General instructions  Apply a warm, moist washcloth to your face 3-4 times a day or as told by your health care provider. This will help with discomfort. Use nasal saline washes as often as told by your health care provider. Wash your hands often with soap and water to reduce your exposure to germs. If soap and water are not available, use hand  sanitizer. Do not smoke. Avoid being around people who are smoking (secondhand smoke). Keep all follow-up visits. This is important. Contact a health care provider if: You have a fever. Your symptoms get worse. Your symptoms do not improve within 10 days. Get help right away if: You have a severe headache. You have persistent vomiting. You have severe pain or swelling around your face or eyes. You have vision problems. You develop confusion. Your neck is stiff. You have trouble breathing. These symptoms may be an emergency. Get help right away. Call 911. Do not wait to see if the symptoms will go away. Do not drive yourself to the hospital. Summary A sinus infection is soreness and inflammation of your sinuses. Sinuses are hollow spaces in the bones around your face. This condition is caused by nasal tissues that become inflamed or swollen. The swelling traps or blocks the flow of mucus. This allows bacteria, viruses, and fungi to grow, which leads to infection. If you were prescribed an antibiotic medicine, take it as told by your health care provider. Do not stop taking the antibiotic even if you start to feel better. Keep all follow-up visits. This is important. This information is not intended to replace advice given to you by your health care provider. Make sure you discuss any questions you have with your health care provider. Document Revised: 03/20/2021 Document Reviewed: 03/20/2021 Elsevier Patient Education  2024 Elsevier Inc.    If you have been instructed to have an in-person evaluation today at a local Urgent Care facility, please use the link below. It will take you to a list of all of our available [facility], including address, phone number and hours of operation. Please do not delay care.  [facility]  If you or a family member do not have a primary care provider, use the link below to schedule a visit and establish care. When you choose a  [facility] primary care physician or advanced practice provider, you gain a long-term partner in health. Find a Primary Care Provider  Learn more about [facility]'s in-office and virtual care options: [facility] - Get Care Now

## 2022-12-12 NOTE — Progress Notes (Signed)
Virtual Visit Consent   Audrey Campbell, you are scheduled for a virtual visit with a [facility] provider today. Just as with appointments in the office, your consent must be obtained to participate. Your consent will be active for this visit and any virtual visit you may have with one of our providers in the next 365 days. If you have a MyChart account, a copy of this consent can be sent to you electronically.  As this is a virtual visit, video technology does not allow for your provider to perform a traditional examination. This may limit your provider's ability to fully assess your condition. If your provider identifies any concerns that need to be evaluated in person or the need to arrange testing (such as labs, EKG, etc.), we will make arrangements to do so. Although advances in technology are sophisticated, we cannot ensure that it will always work on either your end or our end. If the connection with a video visit is poor, the visit may have to be switched to a telephone visit. With either a video or telephone visit, we are not always able to ensure that we have a secure connection.  By engaging in this virtual visit, you consent to the provision of healthcare and authorize for your insurance to be billed (if applicable) for the services provided during this visit. Depending on your insurance coverage, you may receive a charge related to this service.  I need to obtain your verbal consent now. Are you willing to proceed with your visit today? Audrey Campbell has provided verbal consent on 12/12/2022 for a virtual visit (video or telephone). Margaretann Loveless, PA-C  Date: 12/12/2022 1:20 PM  Virtual Visit via Video Note   I, Margaretann Loveless, connected with  Audrey Campbell  ([id number], 1955/09/07) on 12/12/22 at  1:15 PM EDT by a video-enabled telemedicine application and verified that I am speaking with the correct person using two identifiers.  Location: Patient: Virtual Visit  Location Patient: Mobile Provider: Virtual Visit Location Provider: Home Office   I discussed the limitations of evaluation and management by telemedicine and the availability of in person appointments. The patient expressed understanding and agreed to proceed.    History of Present Illness: Audrey Campbell is a 67 y.o. who identifies as a female who was assigned female at birth, and is being seen today for URI.  HPI: URI  This is a new problem. The current episode started 1 to 4 weeks ago. The problem has been gradually worsening. Maximum temperature: subjective fever, low grade. Associated symptoms include congestion, headaches, rhinorrhea and sinus pain. Pertinent negatives include no coughing, ear pain, plugged ear sensation or sore throat. She has tried decongestant and antihistamine (Dayquil) for the symptoms. The treatment provided mild relief.    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/09/2019   Psychophysiological insomnia 07/09/2019   GAD (generalized anxiety disorder) 07/09/2019   Primary osteoarthritis involving multiple joints 07/09/2019   Chronic pain of right knee 07/09/2019  Chronic bilateral low back pain without sciatica 07/09/2019   Centrilobular emphysema (HCC) 07/09/2019   Obesity 07/16/2017   Hypothyroidism 07/16/2017   Former smoker 07/16/2017   Plantar fasciitis, bilateral 07/16/2017    Allergies:  Allergies  Allergen Reactions   Sulfa Antibiotics     Reports had  reaction ~20 years ago   Penicillins Rash and Other (See Comments)    Has patient had a PCN reaction causing immediate rash, facial/tongue/throat swelling, SOB or lightheadedness with hypotension: No Has patient had a PCN reaction causing severe rash involving mucus membranes or skin necrosis: No Has patient had a PCN reaction that required hospitalization: No Has patient had a PCN reaction occurring within the last 10 years: Yes If all of the above answers are "NO", then may proceed with Cephalosporin use.    Medications:  Current Outpatient Medications:    azithromycin (ZITHROMAX) 250 MG tablet, Take 2 tablets on day 1, then 1 tablet daily on days 2 through 5, Disp: 6 tablet, Rfl: 0   albuterol (ACCUNEB) 0.63 MG/3ML nebulizer solution, Take 1 ampule by nebulization every 6 (six) hours as needed for wheezing. (Patient not taking: Reported on 11/15/2022), Disp: , Rfl:    albuterol (PROVENTIL) (2.5 MG/3ML) 0.083% nebulizer solution, USE 1 VIAL IN NEBULIZER 4 TIMES DAILY - And As Needed, Disp: 120 mL, Rfl: 0   albuterol (VENTOLIN HFA) 108 (90 Base) MCG/ACT inhaler, INHALE 2 PUFFS INTO THE LUNGS EVERY 4 HOURS AS NEEDED FOR WHEEZING OR SHORTNESS OF BREATH, Disp: 8.5 g, Rfl: 0   BREZTRI AEROSPHERE 160-9-4.8 MCG/ACT AERO, INHALE 2 PUFFS BY MOUTH INTO THE LUNGS IN THE MORNING AND AT BEDTIME, Disp: 10.7 g, Rfl: 2   butalbital-acetaminophen-caffeine (FIORICET) 50-325-40 MG tablet, Take 1 tablet by mouth every 6 (six) hours as needed for headache., Disp: 30 tablet, Rfl: 2   diclofenac Sodium (VOLTAREN) 1 % GEL, Apply 2 g topically 4 (four) times daily as needed (arthritis knee pain)., Disp: 100 g, Rfl: 2   fluticasone (FLONASE) 50 MCG/ACT nasal spray, Place 2 sprays into both nostrils daily., Disp: 16 g, Rfl: 3   gabapentin (NEURONTIN) 400 MG capsule, TAKE 1 CAPSULE BY MOUTH 4 TIMES DAILY, Disp: 360 capsule, Rfl: 3   hydrOXYzine (VISTARIL) 25 MG capsule, Take 1 capsule (25 mg total) by mouth daily as  needed., Disp: 30 capsule, Rfl: 2   levothyroxine (SYNTHROID) 100 MCG tablet, Take 1 tablet (100 mcg total) by mouth daily before breakfast., Disp: 90 tablet, Rfl: 3   montelukast (SINGULAIR) 10 MG tablet, TAKE 1 TABLET BY MOUTH DAILY, Disp: 90 tablet, Rfl: 1   Oxycodone HCl 10 MG TABS, Take 10 mg by mouth 4 (four) times daily as needed., Disp: , Rfl:    polyethylene glycol (MIRALAX / GLYCOLAX) 17 g packet, Take 17 g by mouth daily as needed., Disp: , Rfl:    QUEtiapine (SEROQUEL) 100 MG tablet, Take 2 tablets (200 mg total) by mouth at bedtime., Disp: 180 tablet, Rfl: 1  Observations/Objective: Patient is well-developed, well-nourished in no acute distress.  Resting comfortably Head is normocephalic, atraumatic.  No labored breathing.  Speech is clear and coherent with logical content.  Patient is alert and oriented at baseline.    Assessment and Plan: 1. Acute bacterial sinusitis - azithromycin (ZITHROMAX) 250 MG tablet; Take 2 tablets on day 1, then 1 tablet daily on days 2 through 5  Dispense: 6 tablet; Refill: 0  - Worsening symptoms that have not responded to OTC medications.  - Will  give Azithromycin - Continue allergy medications.  - Steam and humidifier can help - Stay well hydrated and get plenty of rest.  - Seek in person evaluation if no symptom improvement or if symptoms worsen   Follow Up Instructions: I discussed the assessment and treatment plan with the patient. The patient was provided an opportunity to ask questions and all were answered. The patient agreed with the plan and demonstrated an understanding of the instructions.  A copy of instructions were sent to the patient via MyChart unless otherwise noted below.    The patient was advised to call back or seek an in-person evaluation if the symptoms worsen or if the condition fails to improve as anticipated.  Time:  I spent 8 minutes with the patient via telehealth technology discussing the above  problems/concerns.    Margaretann Loveless, PA-C

## 2022-12-12 NOTE — Telephone Encounter (Signed)
Summary: Head cold, congest, losing taste   head cold, congestion, losing taste, low grade fever.  1 week,,,

## 2022-12-12 NOTE — Telephone Encounter (Signed)
  Chief Complaint: URI - pt has COPD Symptoms: Congestion, low fever, feels poorly Frequency: 1 week Pertinent Negatives: Patient denies SOB Disposition: [] ED /[] Urgent Care (no appt availability in office) / [] Appointment(In office/virtual)/ [x]  [facility]/ [] Home Care/ [] Refused Recommended Disposition /[] [facility]/ []  Follow-up with PCP Additional Notes: Pt states that she has had s/s for 1 week, and is feeling worse. Pt has COPD. No appts in office, VV scheduled.    Summary: Head cold, congest, losing taste   head cold, congestion, losing taste, low grade fever.  1 week,,,     Reason for Disposition  Fever present > 3 days (72 hours)  Answer Assessment - Initial Assessment Questions 1. ONSET: "When did the nasal discharge start?"      Last week 4. RESPIRATORY DISTRESS: "Describe your breathing."      Pt has COPD 6. SEVERITY: "Overall, how bad are you feeling right now?" (e.g., doesn't interfere with normal activities, staying home from school/work, staying in bed)      Poorly 7. OTHER SYMPTOMS: "Do you have any other symptoms?" (e.g., sore throat, earache, wheezing, vomiting)     Congestion, low fever  Protocols used: Common Cold-A-AH

## 2022-12-25 ENCOUNTER — Encounter: Payer: Self-pay | Admitting: Acute Care

## 2022-12-25 ENCOUNTER — Ambulatory Visit (INDEPENDENT_AMBULATORY_CARE_PROVIDER_SITE_OTHER): Payer: Medicare Other | Admitting: Acute Care

## 2022-12-25 DIAGNOSIS — F17210 Nicotine dependence, cigarettes, uncomplicated: Secondary | ICD-10-CM | POA: Diagnosis not present

## 2022-12-25 NOTE — Progress Notes (Signed)
Virtual Visit via Telephone Note  I connected with Audrey Campbell on 12/25/22 at 10:00 AM EDT by telephone and verified that I am speaking with the correct person using two identifiers.  Location: Patient:  At home Provider: [address] [place], [place], [address]    I discussed the limitations, risks, security and privacy concerns of performing an evaluation and management service by telephone and the availability of in person appointments. I also discussed with the patient that there may be a patient responsible charge related to this service. The patient expressed understanding and agreed to proceed.    Shared Decision Making Visit Lung Cancer Screening Program [phone])   Eligibility: Age 67 y.o. Pack Years Smoking History Calculation 38 pack years (# packs/per year x # years smoked) Recent History of coughing up blood  no Unexplained weight loss? no ( >Than 15 pounds within the last 6 months ) Prior History Lung / other cancer no (Diagnosis within the last 5 years already requiring surveillance chest CT Scans). Smoking Status Current Smoker Former Smokers: Years since quit:  NA  Quit Date:  NA  Visit Components: Discussion included one or more decision making aids. yes Discussion included risk/benefits of screening. yes Discussion included potential follow up diagnostic testing for abnormal scans. yes Discussion included meaning and risk of over diagnosis. yes Discussion included meaning and risk of False Positives. yes Discussion included meaning of total radiation exposure. yes  Counseling Included: Importance of adherence to annual lung cancer LDCT screening. yes Impact of comorbidities on ability to participate in the program. yes Ability and willingness to under diagnostic treatment. yes  Smoking Cessation Counseling: Current Smokers:  Discussed importance of smoking cessation. yes Information about tobacco cessation classes and interventions provided  to patient. yes Patient provided with "ticket" for LDCT Scan. yes Symptomatic Patient. no  Counseling NA Diagnosis Code: Tobacco Use Z72.0 Asymptomatic Patient yes  Counseling (Intermediate counseling: > three minutes counseling) G2952 Former Smokers:  Discussed the importance of maintaining cigarette abstinence. yes Diagnosis Code: Personal History of Nicotine Dependence. W41.324 Information about tobacco cessation classes and interventions provided to patient. Yes Patient provided with "ticket" for LDCT Scan. yes Written Order for Lung Cancer Screening with LDCT placed in Epic. Yes (CT Chest Lung Cancer Screening Low Dose W/O CM) [id number] Z12.2-Screening of respiratory organs Z87.891-Personal history of nicotine dependence  I have spent 25 minutes of face to face/ virtual visit   time with  Audrey Campbell discussing the risks and benefits of lung cancer screening. We viewed / discussed a power point together that explained in detail the above noted topics. We paused at intervals to allow for questions to be asked and answered to ensure understanding.We discussed that the single most powerful action that she can take to decrease her risk of developing lung cancer is to quit smoking. We discussed whether or not she is ready to commit to setting a quit date. We discussed options for tools to aid in quitting smoking including nicotine replacement therapy, non-nicotine medications, support groups, Quit Smart classes, and behavior modification. We discussed that often times setting smaller, more achievable goals, such as eliminating 1 cigarette a day for a week and then 2 cigarettes a day for a week can be helpful in slowly decreasing the number of cigarettes smoked. This allows for a sense of accomplishment as well as providing a clinical benefit. I provided  her  with smoking cessation  information  with contact information for community resources, classes, free nicotine  replacement therapy, and access  to mobile apps, text messaging, and on-line smoking cessation help. I have also provided  her  the office contact information in the event she needs to contact me, or the screening staff. We discussed the time and location of the scan, and that either Abigail Miyamoto RN, Karlton Lemon, RN  or I will call / send a letter with the results within 24-72 hours of receiving them. The patient verbalized understanding of all of  the above and had no further questions upon leaving the office. They have my contact information in the event they have any further questions.  I spent 3-4 minutes counseling on smoking cessation and the health risks of continued tobacco abuse.  I explained to the patient that there has been a high incidence of coronary artery disease noted on these exams. I explained that this is a non-gated exam therefore degree or severity cannot be determined. This patient is not on statin therapy. I have asked the patient to follow-up with their PCP regarding any incidental finding of coronary artery disease and management with diet or medication as their PCP  feels is clinically indicated. The patient verbalized understanding of the above and had no further questions upon completion of the visit.  Pt. Had quit in April 2024. She started smoking again 10/2022, she is currently smoking 1/2 PPD. I have counseled her to quit for 3-4 minutes.   Weight loss in the last 6 months is 4 pounds.             02/2022>> 171 lbs 08/2022>> 171 lbs 10/2022 >> 167 lbs Suspect weigh loss correlates to when she started smoking again in July      Audrey Ngo, NP 12/25/2022

## 2022-12-25 NOTE — Patient Instructions (Signed)

## 2022-12-26 ENCOUNTER — Other Ambulatory Visit: Payer: Self-pay | Admitting: Family Medicine

## 2022-12-26 DIAGNOSIS — J432 Centrilobular emphysema: Secondary | ICD-10-CM

## 2022-12-27 ENCOUNTER — Ambulatory Visit
Admission: RE | Admit: 2022-12-27 | Discharge: 2022-12-27 | Disposition: A | Discharge status: Home / Self Care | Payer: Medicare Other | Source: Ambulatory Visit | Attending: Acute Care | Admitting: Acute Care

## 2022-12-27 DIAGNOSIS — Z122 Encounter for screening for malignant neoplasm of respiratory organs: Secondary | ICD-10-CM | POA: Insufficient documentation

## 2022-12-27 DIAGNOSIS — Z87891 Personal history of nicotine dependence: Secondary | ICD-10-CM | POA: Diagnosis not present

## 2022-12-27 DIAGNOSIS — J439 Emphysema, unspecified: Secondary | ICD-10-CM | POA: Insufficient documentation

## 2022-12-27 DIAGNOSIS — I700 Atherosclerosis of aorta: Secondary | ICD-10-CM | POA: Diagnosis not present

## 2022-12-27 NOTE — Telephone Encounter (Signed)
Requested Prescriptions  Pending Prescriptions Disp Refills   albuterol (VENTOLIN HFA) 108 (90 Base) MCG/ACT inhaler [Pharmacy Med Name: ALBUTEROL SULFATE HFA 108 (90 BASE)] 8.5 g 0    Sig: INHALE 2 PUFFS INTO THE LUNGS EVERY 4 HOURS AS NEEDED FOR WHEEZING OR SHORTNESS OF BREATH     Pulmonology:  Beta Agonists 2 Failed - 12/26/2022 12:10 PM      Failed - Last BP in normal range    BP Readings from Last 1 Encounters:  11/25/22 (!) 140/86         Passed - Last Heart Rate in normal range    Pulse Readings from Last 1 Encounters:  11/25/22 73         Passed - Valid encounter within last 12 months    Recent Outpatient Visits           3 months ago Centrilobular emphysema [facility])   [place] [facility] [place], Netta Neat, DO   6 months ago Centrilobular emphysema [facility])   [place] [facility] Delles, [name], RPH-CPP   8 months ago Centrilobular emphysema [facility])   [place] [facility] Delles, [name], RPH-CPP   9 months ago Centrilobular emphysema [facility])   [place] [facility] Smitty Cords, DO   1 year ago Annual physical exam   [place] [facility] Smitty Cords, DO       Future Appointments             In 3 months Althea Charon, Netta Neat, DO [facility] [facility], [facility]

## 2023-01-06 ENCOUNTER — Ambulatory Visit: Payer: Medicare Other | Admitting: Anesthesiology

## 2023-01-06 ENCOUNTER — Other Ambulatory Visit: Payer: Self-pay

## 2023-01-06 ENCOUNTER — Encounter
Admission: RE | Disposition: A | Discharge status: Home / Self Care | Payer: Self-pay | Source: Ambulatory Visit | Attending: Ophthalmology

## 2023-01-06 ENCOUNTER — Other Ambulatory Visit: Payer: Self-pay | Admitting: Acute Care

## 2023-01-06 ENCOUNTER — Encounter: Payer: Self-pay | Admitting: Ophthalmology

## 2023-01-06 ENCOUNTER — Ambulatory Visit
Admission: RE | Admit: 2023-01-06 | Discharge: 2023-01-06 | Disposition: A | Discharge status: Home / Self Care | Payer: Medicare Other | Source: Ambulatory Visit | Attending: Ophthalmology | Admitting: Ophthalmology

## 2023-01-06 DIAGNOSIS — F17210 Nicotine dependence, cigarettes, uncomplicated: Secondary | ICD-10-CM | POA: Diagnosis not present

## 2023-01-06 DIAGNOSIS — J449 Chronic obstructive pulmonary disease, unspecified: Secondary | ICD-10-CM | POA: Insufficient documentation

## 2023-01-06 DIAGNOSIS — Z87891 Personal history of nicotine dependence: Secondary | ICD-10-CM | POA: Diagnosis not present

## 2023-01-06 DIAGNOSIS — H2512 Age-related nuclear cataract, left eye: Secondary | ICD-10-CM | POA: Insufficient documentation

## 2023-01-06 DIAGNOSIS — E039 Hypothyroidism, unspecified: Secondary | ICD-10-CM | POA: Insufficient documentation

## 2023-01-06 DIAGNOSIS — H269 Unspecified cataract: Secondary | ICD-10-CM | POA: Diagnosis not present

## 2023-01-06 DIAGNOSIS — I10 Essential (primary) hypertension: Secondary | ICD-10-CM | POA: Insufficient documentation

## 2023-01-06 DIAGNOSIS — Z9981 Dependence on supplemental oxygen: Secondary | ICD-10-CM | POA: Diagnosis not present

## 2023-01-06 DIAGNOSIS — G894 Chronic pain syndrome: Secondary | ICD-10-CM | POA: Insufficient documentation

## 2023-01-06 DIAGNOSIS — Z122 Encounter for screening for malignant neoplasm of respiratory organs: Secondary | ICD-10-CM

## 2023-01-06 HISTORY — PX: CATARACT EXTRACTION W/PHACO: SHX586

## 2023-01-06 SURGERY — PHACOEMULSIFICATION, CATARACT, WITH IOL INSERTION
Anesthesia: Monitor Anesthesia Care | Site: Eye | Laterality: Left

## 2023-01-06 MED ORDER — TETRACAINE HCL 0.5 % OP SOLN
1.0000 [drp] | OPHTHALMIC | Status: DC | PRN
  Administered 2023-01-06 (×2): 1 [drp] via OPHTHALMIC

## 2023-01-06 MED ORDER — SIGHTPATH DOSE#1 BSS IO SOLN
INTRAOCULAR | Status: DC | PRN
  Administered 2023-01-06: 15 mL

## 2023-01-06 MED ORDER — LACTATED RINGERS IV SOLN: INTRAVENOUS | Status: DC

## 2023-01-06 MED ORDER — FENTANYL CITRATE (PF) 100 MCG/2ML IJ SOLN
INTRAMUSCULAR | Status: DC | PRN
  Administered 2023-01-06: 100 ug via INTRAVENOUS

## 2023-01-06 MED ORDER — MIDAZOLAM HCL 2 MG/2ML IJ SOLN
INTRAMUSCULAR | Status: DC | PRN
  Administered 2023-01-06: 2 mg via INTRAVENOUS

## 2023-01-06 MED ORDER — MOXIFLOXACIN HCL 0.5 % OP SOLN
OPHTHALMIC | Status: DC | PRN
  Administered 2023-01-06: .2 mL via OPHTHALMIC

## 2023-01-06 MED ORDER — SIGHTPATH DOSE#1 BSS IO SOLN
INTRAOCULAR | Status: DC | PRN
  Administered 2023-01-06: 103 mL via OPHTHALMIC

## 2023-01-06 MED ORDER — LIDOCAINE HCL (PF) 2 % IJ SOLN
INTRAOCULAR | Status: DC | PRN
  Administered 2023-01-06: 1 mL via INTRAOCULAR

## 2023-01-06 MED ORDER — SIGHTPATH DOSE#1 NA HYALUR & NA CHOND-NA HYALUR IO KIT
PACK | INTRAOCULAR | Status: DC | PRN
  Administered 2023-01-06: 1 via OPHTHALMIC

## 2023-01-06 MED ORDER — ARMC OPHTHALMIC DILATING DROPS
1.0000 | OPHTHALMIC | Status: AC | PRN
  Administered 2023-01-06 (×3): 1 via OPHTHALMIC

## 2023-01-06 SURGICAL SUPPLY — 11 items
CATARACT SUITE SIGHTPATH (MISCELLANEOUS) ×1
DISSECTOR HYDRO NUCLEUS 50X22 (MISCELLANEOUS) ×1 IMPLANT
FEE CATARACT SUITE SIGHTPATH (MISCELLANEOUS) ×1 IMPLANT
GLOVE SURG GAMMEX PI TX LF 7.5 (GLOVE) ×1 IMPLANT
GLOVE SURG SYN 8.5 E (GLOVE) ×1
GLOVE SURG SYN 8.5 PF PI (GLOVE) ×1 IMPLANT
LENS IOL TECNIS EYHANCE 19.0 (Intraocular Lens) IMPLANT
NDL FILTER BLUNT 18X1 1/2 (NEEDLE) ×1 IMPLANT
NEEDLE FILTER BLUNT 18X1 1/2 (NEEDLE) ×1
SYR 3ML LL SCALE MARK (SYRINGE) ×1 IMPLANT
SYR 5ML LL (SYRINGE) ×1 IMPLANT

## 2023-01-06 NOTE — Anesthesia Postprocedure Evaluation (Signed)
Anesthesia Post Note  Patient: Audrey Campbell  Procedure(s) Performed: CATARACT EXTRACTION PHACO AND INTRAOCULAR LENS PLACEMENT (IOC) LEFT  6.57  00:42.8 (Left: Eye)  Patient location during evaluation: PACU Anesthesia Type: MAC Level of consciousness: awake and alert Pain management: pain level controlled Vital Signs Assessment: post-procedure vital signs reviewed and stable Respiratory status: spontaneous breathing, nonlabored ventilation, respiratory function stable and patient connected to nasal cannula oxygen Cardiovascular status: stable and blood pressure returned to baseline Postop Assessment: no apparent nausea or vomiting Anesthetic complications: no   No notable events documented.   Last Vitals:  Vitals:   01/06/23 1206 01/06/23 1212  BP: (!) 150/81 (!) 155/82  Pulse: 69   Resp: 20   Temp: (!) 36.2 C   SpO2: 98%     Last Pain:  Vitals:   01/06/23 1212  TempSrc:   PainSc: 0-No pain                 Lenard Simmer

## 2023-01-06 NOTE — Op Note (Signed)
OPERATIVE NOTE  Audrey Campbell [id number] 01/06/2023   PREOPERATIVE DIAGNOSIS:  Nuclear sclerotic cataract left eye.  H25.12   POSTOPERATIVE DIAGNOSIS:    Nuclear sclerotic cataract left eye.     PROCEDURE:  Phacoemusification with posterior chamber intraocular lens placement of the left eye   LENS:   Implant Name Type Inv. Item Serial No. Manufacturer Lot No. LRB No. Used Action  LENS IOL TECNIS EYHANCE 19.0 - [id number] Intraocular Lens LENS IOL TECNIS EYHANCE 19.0 [id number] [employer]  Left 1 Implanted      Procedure(s): CATARACT EXTRACTION PHACO AND INTRAOCULAR LENS PLACEMENT (IOC) LEFT  6.57  00:42.8 (Left)  DIB00 +19.0   ULTRASOUND TIME: 0 minutes 42 seconds.  CDE 6.57   SURGEON:  Willey Blade, MD, MPH   ANESTHESIA:  Topical with tetracaine drops augmented with 1% preservative-free intracameral lidocaine.  ESTIMATED BLOOD LOSS: <1 mL   COMPLICATIONS:  None.   DESCRIPTION OF PROCEDURE:  The patient was identified in the holding room and transported to the operating room and placed in the supine position under the operating microscope.  The left eye was identified as the operative eye and it was prepped and draped in the usual sterile ophthalmic fashion.   A 1.0 millimeter clear-corneal paracentesis was made at the 5:00 position. 0.5 ml of preservative-free 1% lidocaine with epinephrine was injected into the anterior chamber.  The anterior chamber was filled with viscoelastic.  A 2.4 millimeter keratome was used to make a near-clear corneal incision at the 2:00 position.  A curvilinear capsulorrhexis was made with a cystotome and capsulorrhexis forceps.  Balanced salt solution was used to hydrodissect and hydrodelineate the nucleus.   Phacoemulsification was then used in stop and chop fashion to remove the lens nucleus and epinucleus.  The remaining cortex was then removed using the irrigation and aspiration handpiece. Viscoelastic was then placed into the capsular bag to  distend it for lens placement.  A lens was then injected into the capsular bag.  The remaining viscoelastic was aspirated.   Wounds were hydrated with balanced salt solution.  The anterior chamber was inflated to a physiologic pressure with balanced salt solution.  Intracameral vigamox 0.1 mL undiltued was injected into the eye and a drop placed onto the ocular surface.  No wound leaks were noted.  The patient was taken to the recovery room in stable condition without complications of anesthesia or surgery  Willey Blade 01/06/2023, 12:05 PM

## 2023-01-06 NOTE — H&P (Signed)
Audrey Campbell   Primary Care Physician:  Smitty Cords, DO Ophthalmologist: Dr. Willey Blade  Pre-Procedure History & Physical: HPI:  Audrey Campbell is a 67 y.o. female here for cataract surgery.   Past Medical History:  Diagnosis Date   Anxiety    Arthritis    Chronic pain syndrome    COPD (chronic obstructive pulmonary disease) (HCC)    Hyperlipidemia    Hypertension    Hypothyroidism    On home oxygen therapy    Plantar fasciitis     Past Surgical History:  Procedure Laterality Date   bulging disc in the neck     CATARACT EXTRACTION W/PHACO Right 11/25/2022   Procedure: CATARACT EXTRACTION PHACO AND INTRAOCULAR LENS PLACEMENT (IOC) RIGHT  11.84  01:01.5;  Surgeon: Nevada Crane, MD;  [facility]: [facility];  Service: Ophthalmology;  Laterality: Right;   COLONOSCOPY WITH PROPOFOL N/A 12/29/2019   Procedure: COLONOSCOPY WITH PROPOFOL;  Surgeon: Pasty Spillers, MD;  Location: [facility];  Service: Endoscopy;  Laterality: N/A;   COLONOSCOPY WITH PROPOFOL N/A 01/24/2021   Procedure: COLONOSCOPY WITH PROPOFOL;  Surgeon: Pasty Spillers, MD;  Location: [facility];  Service: Endoscopy;  Laterality: N/A;   DILATION AND CURETTAGE OF UTERUS     ESOPHAGOGASTRODUODENOSCOPY (EGD) WITH PROPOFOL N/A 12/29/2019   Procedure: ESOPHAGOGASTRODUODENOSCOPY (EGD) WITH PROPOFOL;  Surgeon: Pasty Spillers, MD;  Location: [facility];  Service: Endoscopy;  Laterality: N/A;   FOOT SURGERY Bilateral    heel  fasciitis     RHINOPLASTY     SHOULDER SURGERY     TUBAL LIGATION      Prior to Admission medications   Medication Sig Start Date End Date Taking? Authorizing Provider  albuterol (PROVENTIL) (2.5 MG/3ML) 0.083% nebulizer solution USE 1 VIAL IN NEBULIZER 4 TIMES DAILY - And As Needed 11/11/22  Yes Baity, Salvadore Oxford, NP  albuterol (VENTOLIN HFA) 108 (90 Base) MCG/ACT inhaler INHALE 2 PUFFS INTO THE LUNGS EVERY 4 HOURS AS NEEDED FOR WHEEZING OR  SHORTNESS OF BREATH 12/27/22  Yes Karamalegos, Netta Neat, DO  BREZTRI AEROSPHERE 160-9-4.8 MCG/ACT AERO INHALE 2 PUFFS BY MOUTH INTO THE LUNGS IN THE MORNING AND AT BEDTIME 06/06/20  Yes Gabriel Cirri, NP  butalbital-acetaminophen-caffeine (FIORICET) [phone] MG tablet Take 1 tablet by mouth every 6 (six) hours as needed for headache. 06/17/22  Yes Karamalegos, Netta Neat, DO  diclofenac Sodium (VOLTAREN) 1 % GEL Apply 2 g topically 4 (four) times daily as needed (arthritis knee pain). 09/26/22  Yes Karamalegos, Netta Neat, DO  fluticasone (FLONASE) 50 MCG/ACT nasal spray Place 2 sprays into both nostrils daily. 11/21/20  Yes Karamalegos, Netta Neat, DO  gabapentin (NEURONTIN) 400 MG capsule TAKE 1 CAPSULE BY MOUTH 4 TIMES DAILY 09/26/22  Yes Karamalegos, Netta Neat, DO  hydrOXYzine (VISTARIL) 25 MG capsule Take 1 capsule (25 mg total) by mouth daily as needed. 11/21/22  Yes Karamalegos, Netta Neat, DO  levothyroxine (SYNTHROID) 100 MCG tablet Take 1 tablet (100 mcg total) by mouth daily before breakfast. 09/26/22  Yes Karamalegos, Alexander J, DO  montelukast (SINGULAIR) 10 MG tablet TAKE 1 TABLET BY MOUTH DAILY 09/04/22  Yes Karamalegos, Netta Neat, DO  Oxycodone HCl 10 MG TABS Take 10 mg by mouth 4 (four) times daily as needed. 01/22/22  Yes [provider]  polyethylene glycol (MIRALAX / GLYCOLAX) 17 g packet Take 17 g by mouth daily as needed.   Yes [provider]  QUEtiapine (SEROQUEL) 100 MG tablet Take 2 tablets (  200 mg total) by mouth at bedtime. 09/26/22  Yes Karamalegos, Netta Neat, DO  albuterol (ACCUNEB) 0.63 MG/3ML nebulizer solution Take 1 ampule by nebulization every 6 (six) hours as needed for wheezing. Patient not taking: Reported on 11/15/2022    [provider]    Allergies as of 11/11/2022 - Review Complete 09/26/2022  Allergen Reaction Noted   Sulfa antibiotics  06/23/2020   Penicillins Rash and Other (See Comments) 02/23/2013    Family History   Problem Relation Age of Onset   Diabetes Father    Stroke Father 63   Thyroid disease Father    Seizures Sister    Breast cancer Neg Hx     Social History   Socioeconomic History   Marital status: Widowed    Spouse name: Not on file   Number of children: Not on file   Years of education: high school   Highest education level: High school graduate  Occupational History   Not on file  Tobacco Use   Smoking status: Every Day    Current packs/day: 0.75    Average packs/day: 0.8 packs/day for 50.7 years (38.0 ttl pk-yrs)    Types: Cigarettes    Start date: 04/29/1972   Smokeless tobacco: Never  Vaping Use   Vaping status: Never Used  Substance and Sexual Activity   Alcohol use: No   Drug use: Yes    Types: Marijuana   Sexual activity: Yes    Birth control/protection: None  Other Topics Concern   Not on file  Social History Narrative   Not on file   Social Determinants of Health   Financial Resource Strain: Low Risk  (03/01/2022)   Overall Financial Resource Strain (CARDIA)    Difficulty of Paying Living Expenses: Not hard at all  Food Insecurity: No Food Insecurity (03/01/2022)   Hunger Vital Sign    Worried About Running Out of Food in the Last Year: Never true    Ran Out of Food in the Last Year: Never true  Transportation Needs: No Transportation Needs (03/01/2022)   PRAPARE - Administrator, Civil Service (Medical): No    Lack of Transportation (Non-Medical): No  Physical Activity: Inactive (03/01/2022)   Exercise Vital Sign    Days of Exercise per Week: 0 days    Minutes of Exercise per Session: 0 min  Stress: No Stress Concern Present (03/01/2022)   [employer] of Occupational Health - Occupational Stress Questionnaire    Feeling of Stress : Only a little  Social Connections: Moderately Integrated (03/01/2022)   Social Connection and Isolation Panel [NHANES]    Frequency of Communication with Friends and Family: More than three times a week     Frequency of Social Gatherings with Friends and Family: More than three times a week    Attends Religious Services: More than 4 times per year    Active Member of [employer] or Organizations: No    Attends Banker Meetings: Never    Marital Status: Married  Catering manager Violence: Not At Risk (03/01/2022)   Humiliation, Afraid, Rape, and Kick questionnaire    Fear of Current or Ex-Partner: No    Emotionally Abused: No    Physically Abused: No    Sexually Abused: No    Review of Systems: See HPI, otherwise negative ROS  Physical Exam: BP (!) 140/88   Pulse 78   Temp 98.4 F (36.9 C) (Temporal)   Ht 5' 4.02" (1.626 m)   Wt 75.5 kg  SpO2 93%   BMI 28.55 kg/m  General:   Alert, cooperative in NAD Head:  Normocephalic and atraumatic. Respiratory:  Normal work of breathing. Cardiovascular:  RRR  Impression/Plan: AMBIKA SCHUERMAN is here for cataract surgery.  Risks, benefits, limitations, and alternatives regarding cataract surgery have been reviewed with the patient.  Questions have been answered.  All parties agreeable.   Willey Blade, MD  01/06/2023, 11:41 AM

## 2023-01-06 NOTE — Transfer of Care (Signed)
Immediate Anesthesia Transfer of Care Note  Patient: Audrey Campbell  Procedure(s) Performed: CATARACT EXTRACTION PHACO AND INTRAOCULAR LENS PLACEMENT (IOC) LEFT  6.57  00:42.8 (Left: Eye)  Patient Location: PACU  Anesthesia Type: MAC  Level of Consciousness: awake, alert  and patient cooperative  Airway and Oxygen Therapy: Patient Spontanous Breathing and Patient connected to supplemental oxygen  Post-op Assessment: Post-op Vital signs reviewed, Patient's Cardiovascular Status Stable, Respiratory Function Stable, Patent Airway and No signs of Nausea or vomiting  Post-op Vital Signs: Reviewed and stable  Complications: No notable events documented.

## 2023-01-08 ENCOUNTER — Encounter: Payer: Self-pay | Admitting: Ophthalmology

## 2023-01-18 DIAGNOSIS — J449 Chronic obstructive pulmonary disease, unspecified: Secondary | ICD-10-CM | POA: Diagnosis not present

## 2023-01-27 ENCOUNTER — Other Ambulatory Visit: Payer: Self-pay | Admitting: Family Medicine

## 2023-01-27 DIAGNOSIS — J432 Centrilobular emphysema: Secondary | ICD-10-CM

## 2023-02-05 ENCOUNTER — Ambulatory Visit (INDEPENDENT_AMBULATORY_CARE_PROVIDER_SITE_OTHER): Payer: Medicare Other | Admitting: Internal Medicine

## 2023-02-05 ENCOUNTER — Encounter: Payer: Self-pay | Admitting: Internal Medicine

## 2023-02-05 VITALS — BP 138/84 | HR 82 | Temp 95.5°F | Wt 167.0 lb

## 2023-02-05 DIAGNOSIS — N898 Other specified noninflammatory disorders of vagina: Secondary | ICD-10-CM

## 2023-02-05 MED ORDER — FLUCONAZOLE 150 MG PO TABS: 150.0000 mg | ORAL_TABLET | Freq: Once | ORAL | 0 refills | Status: AC

## 2023-02-05 NOTE — Progress Notes (Signed)
Subjective:    Patient ID: Audrey Campbell, female    DOB: 1955/10/03, 67 y.o.   MRN: [id number]  HPI  Discussed the use of AI scribe software for clinical note transcription with the patient, who gave verbal consent to proceed.  History of Present Illness   The patient presented with a chief complaint of genital itching that started approximately a week ago. They reported no associated discharge, odor, or abnormal bleeding. The patient noted that the itching had improved slightly on the day of the consultation. They also mentioned the use of deodorant in their underwear, suggesting that this could potentially be the cause of the irritation. The patient denied any lower abdominal pain or urinary symptoms such as increased frequency, urgency, or hematuria. They had not tried any over-the-counter treatments for the symptoms.       Review of Systems     Past Medical History:  Diagnosis Date   Anxiety    Arthritis    Chronic pain syndrome    COPD (chronic obstructive pulmonary disease) (HCC)    Hyperlipidemia    Hypertension    Hypothyroidism    On home oxygen therapy    Plantar fasciitis     Current Outpatient Medications  Medication Sig Dispense Refill   albuterol (ACCUNEB) 0.63 MG/3ML nebulizer solution Take 1 ampule by nebulization every 6 (six) hours as needed for wheezing. (Patient not taking: Reported on 11/15/2022)     albuterol (PROVENTIL) (2.5 MG/3ML) 0.083% nebulizer solution USE 1 VIAL IN NEBULIZER 4 TIMES DAILY - And As Needed 120 mL 0   albuterol (VENTOLIN HFA) 108 (90 Base) MCG/ACT inhaler INHALE 2 PUFFS INTO THE LUNGS EVERY 4 HOURS AS NEEDED FOR WHEEZING OR SHORTNESS OF BREATH 8.5 g 0   BREZTRI AEROSPHERE 160-9-4.8 MCG/ACT AERO INHALE 2 PUFFS BY MOUTH INTO THE LUNGS IN THE MORNING AND AT BEDTIME 10.7 g 2   butalbital-acetaminophen-caffeine (FIORICET) 50-325-40 MG tablet Take 1 tablet by mouth every 6 (six) hours as needed for headache. 30 tablet 2   diclofenac  Sodium (VOLTAREN) 1 % GEL Apply 2 g topically 4 (four) times daily as needed (arthritis knee pain). 100 g 2   fluticasone (FLONASE) 50 MCG/ACT nasal spray Place 2 sprays into both nostrils daily. 16 g 3   gabapentin (NEURONTIN) 400 MG capsule TAKE 1 CAPSULE BY MOUTH 4 TIMES DAILY 360 capsule 3   hydrOXYzine (VISTARIL) 25 MG capsule Take 1 capsule (25 mg total) by mouth daily as needed. 30 capsule 2   levothyroxine (SYNTHROID) 100 MCG tablet Take 1 tablet (100 mcg total) by mouth daily before breakfast. 90 tablet 3   montelukast (SINGULAIR) 10 MG tablet TAKE 1 TABLET BY MOUTH DAILY 90 tablet 1   Oxycodone HCl 10 MG TABS Take 10 mg by mouth 4 (four) times daily as needed.     polyethylene glycol (MIRALAX / GLYCOLAX) 17 g packet Take 17 g by mouth daily as needed.     QUEtiapine (SEROQUEL) 100 MG tablet Take 2 tablets (200 mg total) by mouth at bedtime. 180 tablet 1   No current facility-administered medications for this visit.    Allergies  Allergen Reactions   Sulfa Antibiotics     Reports had reaction ~20 years ago   Penicillins Rash and Other (See Comments)    Has patient had a PCN reaction causing immediate rash, facial/tongue/throat swelling, SOB or lightheadedness with hypotension: No Has patient had a PCN reaction causing severe rash involving mucus membranes or skin necrosis: No  Has patient had a PCN reaction that required hospitalization: No Has patient had a PCN reaction occurring within the last 10 years: Yes If all of the above answers are "NO", then may proceed with Cephalosporin use.     Family History  Problem Relation Age of Onset   Diabetes Father    Stroke Father 3   Thyroid disease Father    Seizures Sister    Breast cancer Neg Hx     Social History   Socioeconomic History   Marital status: Widowed    Spouse name: Not on file   Number of children: Not on file   Years of education: high school   Highest education level: High school graduate  Occupational  History   Not on file  Tobacco Use   Smoking status: Every Day    Current packs/day: 0.75    Average packs/day: 0.8 packs/day for 50.8 years (38.1 ttl pk-yrs)    Types: Cigarettes    Start date: 04/29/1972   Smokeless tobacco: Never  Vaping Use   Vaping status: Never Used  Substance and Sexual Activity   Alcohol use: No   Drug use: Yes    Types: Marijuana   Sexual activity: Yes    Birth control/protection: None  Other Topics Concern   Not on file  Social History Narrative   Not on file   Social Determinants of Health   Financial Resource Strain: Low Risk  (03/01/2022)   Overall Financial Resource Strain (CARDIA)    Difficulty of Paying Living Expenses: Not hard at all  Food Insecurity: No Food Insecurity (03/01/2022)   Hunger Vital Sign    Worried About Running Out of Food in the Last Year: Never true    Ran Out of Food in the Last Year: Never true  Transportation Needs: No Transportation Needs (03/01/2022)   PRAPARE - Administrator, Civil Service (Medical): No    Lack of Transportation (Non-Medical): No  Physical Activity: Inactive (03/01/2022)   Exercise Vital Sign    Days of Exercise per Week: 0 days    Minutes of Exercise per Session: 0 min  Stress: No Stress Concern Present (03/01/2022)   [employer] of Occupational Health - Occupational Stress Questionnaire    Feeling of Stress : Only a little  Social Connections: Moderately Integrated (03/01/2022)   Social Connection and Isolation Panel [NHANES]    Frequency of Communication with Friends and Family: More than three times a week    Frequency of Social Gatherings with Friends and Family: More than three times a week    Attends Religious Services: More than 4 times per year    Active Member of [employer] or Organizations: No    Attends Banker Meetings: Never    Marital Status: Married  Catering manager Violence: Not At Risk (03/01/2022)   Humiliation, Afraid, Rape, and Kick questionnaire     Fear of Current or Ex-Partner: No    Emotionally Abused: No    Physically Abused: No    Sexually Abused: No     Constitutional: Denies fever, malaise, fatigue, headache or abrupt weight changes.  Respiratory: Denies difficulty breathing, shortness of breath, cough or sputum production.   Cardiovascular: Denies chest pain, chest tightness, palpitations or swelling in the hands or feet.  Gastrointestinal: Denies abdominal pain, bloating, constipation, diarrhea or blood in the stool.  GU: Patient reports vaginal itching.  Denies urgency, frequency, pain with urination, burning sensation, blood in urine, odor or discharge.  No other  specific complaints in a complete review of systems (except as listed in HPI above).  Objective:   Physical Exam  BP 138/84 (BP Location: Left Arm, Patient Position: Sitting, Cuff Size: Normal)   Pulse 82   Temp (!) 95.5 F (35.3 C) (Temporal)   Wt 167 lb (75.8 kg)   SpO2 93%   BMI 28.65 kg/m   Wt Readings from Last 3 Encounters:  01/06/23 166 lb 6.4 oz (75.5 kg)  12/27/22 167 lb (75.8 kg)  11/25/22 167 lb 14.4 oz (76.2 kg)    General: Appears her stated age, overweight, in NAD. Cardiovascular: Normal rate. Pulmonary/Chest: Normal effort. Pelvic: Self swab. Neurological: Alert and oriented.    BMET    Component Value Date/Time   NA 136 09/19/2022 0829   K 4.5 09/19/2022 0829   CL 98 09/19/2022 0829   CO2 32 09/19/2022 0829   GLUCOSE 83 09/19/2022 0829   BUN 10 09/19/2022 0829   CREATININE 0.80 09/19/2022 0829   CALCIUM 9.1 09/19/2022 0829   GFRNONAA 64 10/16/2020 0858   GFRAA 75 10/16/2020 0858    Lipid Panel     Component Value Date/Time   CHOL 186 09/19/2022 0829   TRIG 140 09/19/2022 0829   HDL 48 (L) 09/19/2022 0829   CHOLHDL 3.9 09/19/2022 0829   LDLCALC 113 (H) 09/19/2022 0829    CBC    Component Value Date/Time   WBC 7.0 09/19/2022 0829   RBC 4.20 09/19/2022 0829   HGB 11.4 (L) 09/19/2022 0829   HCT 35.4 09/19/2022  0829   PLT 234 09/19/2022 0829   MCV 84.3 09/19/2022 0829   MCH 27.1 09/19/2022 0829   MCHC 32.2 09/19/2022 0829   RDW 15.2 (H) 09/19/2022 0829   LYMPHSABS 2,646 09/19/2022 0829   MONOABS 0.4 12/09/2017 2359   EOSABS 252 09/19/2022 0829   BASOSABS 91 09/19/2022 0829    Hgb A1C Lab Results  Component Value Date   HGBA1C 5.8 (H) 09/19/2022           Assessment & Plan:    Assessment and Plan    Vaginal Itching Onset 4 days ago, improved today. No discharge, odor, or abnormal bleeding. Possible irritation from deodorant use. No urinary symptoms or lower abdominal pain. -Obtain vaginal swab to check for bacterial vaginosis and yeast infection. -Prescribe Fluconazole 150mg  x 1 dose as presumptive treatment for possible yeast infection. -Results expected by 02/07/2023, will follow up with patient.       Follow-up with your PCP as previously scheduled Nicki Reaper, NP

## 2023-02-05 NOTE — Patient Instructions (Signed)
Vaginitis  Vaginitis is irritation and swelling of the vagina. Treatment will depend on the cause. What are the causes? It can be caused by: Bacteria. Yeast. A parasite. A virus. Low hormone levels. Bubble baths, scented tampons, and feminine sprays. Other things can change the balance of the yeast and bacteria that live in the vagina. These include: Antibiotic medicines. Not being clean enough. Some birth control methods. Sex. Infection. Diabetes. A weakened body defense system (immune system). What increases the risk? Smoking or being around someone who smokes. Using washes (douches), scented tampons, or scented pads. Wearing tight pants or thong underwear. Using birth control pills or an IUD. Having sex without a condom or having a lot of partners. Having an STI. Using a certain product to kill sperm (nonoxynol-9). Eating foods that are high in sugar. Having diabetes. Having low levels of a female hormone. Having a weakened body defense system. Being pregnant or breastfeeding. What are the signs or symptoms? Fluid coming from the vagina that is not normal. A bad smell. Itching, pain, or swelling. Pain with sex. Pain or burning when you pee (urinate). Sometimes there are no symptoms. How is this treated? Treatment may include: Antibiotic creams or pills. Antifungal medicines. Medicines to ease symptoms if you have a virus. Your sex partner should also be treated. Estrogen medicines. Avoiding scented soaps, sprays, or douches. Stopping use of products that caused irritation and then using a cream to treat symptoms. Follow these instructions at home: Lifestyle Keep the area around your vagina clean and dry. Avoid using soap. Rinse the area with water. Until your doctor says it is okay: Do not use washes for the vagina. Do not use tampons. Do not have sex. Wipe from front to back after going to the bathroom. When your doctor says it is okay, practice safe sex  and use condoms. General instructions Take over-the-counter and prescription medicines only as told by your doctor. If you were prescribed an antibiotic medicine, take or use it as told by your doctor. Do not stop taking or using it even if you start to feel better. Keep all follow-up visits. How is this prevented? Do not use things that can irritate the vagina, such as fabric softeners. Avoid these products if they are scented: Sprays. Detergents. Tampons. Products for cleaning the vagina. Soaps or bubble baths. Let air reach your vagina. To do this: Wear cotton underwear. Do not wear: Underwear while you sleep. Tight pants. Thong underwear. Underwear or nylons without a cotton panel. Take off any wet clothing, such as bathing suits, as soon as you can. Practice safe sex and use condoms. Contact a doctor if: You have pain in your belly or in the area between your hips. You have a fever or chills. Your symptoms last for more than 2-3 days. Get help right away if: You have a fever and your symptoms get worse all of a sudden. Summary Vaginitis is irritation and swelling of the vagina. Treatment will depend on the cause of the condition. Do not use washes or tampons or have sex until your doctor says it is okay. This information is not intended to replace advice given to you by your health care provider. Make sure you discuss any questions you have with your health care provider. Document Revised: 10/14/2019 Document Reviewed: 10/14/2019 Elsevier Patient Education  2024 [employer].

## 2023-02-06 ENCOUNTER — Other Ambulatory Visit (HOSPITAL_COMMUNITY)
Admission: RE | Admit: 2023-02-06 | Discharge: 2023-02-06 | Disposition: A | Discharge status: Home / Self Care | Payer: Medicare Other | Source: Ambulatory Visit | Attending: Internal Medicine | Admitting: Internal Medicine

## 2023-02-06 DIAGNOSIS — N898 Other specified noninflammatory disorders of vagina: Secondary | ICD-10-CM | POA: Diagnosis present

## 2023-02-06 NOTE — Addendum Note (Signed)
Addended by: Kavin Leech E on: 02/06/2023 09:10 AM   Modules accepted: Orders

## 2023-02-07 LAB — CERVICOVAGINAL ANCILLARY ONLY
Bacterial Vaginitis (gardnerella): NEGATIVE
Candida Glabrata: NEGATIVE
Candida Vaginitis: POSITIVE — AB
Comment: NEGATIVE
Comment: NEGATIVE
Comment: NEGATIVE

## 2023-02-13 DIAGNOSIS — G894 Chronic pain syndrome: Secondary | ICD-10-CM | POA: Diagnosis not present

## 2023-02-13 DIAGNOSIS — M722 Plantar fascial fibromatosis: Secondary | ICD-10-CM | POA: Diagnosis not present

## 2023-02-13 DIAGNOSIS — M545 Low back pain, unspecified: Secondary | ICD-10-CM | POA: Diagnosis not present

## 2023-02-13 DIAGNOSIS — M542 Cervicalgia: Secondary | ICD-10-CM | POA: Diagnosis not present

## 2023-02-13 DIAGNOSIS — Z79891 Long term (current) use of opiate analgesic: Secondary | ICD-10-CM | POA: Diagnosis not present

## 2023-02-13 DIAGNOSIS — M25519 Pain in unspecified shoulder: Secondary | ICD-10-CM | POA: Diagnosis not present

## 2023-02-13 DIAGNOSIS — M25569 Pain in unspecified knee: Secondary | ICD-10-CM | POA: Diagnosis not present

## 2023-02-17 DIAGNOSIS — J449 Chronic obstructive pulmonary disease, unspecified: Secondary | ICD-10-CM | POA: Diagnosis not present

## 2023-02-19 ENCOUNTER — Other Ambulatory Visit: Payer: Self-pay | Admitting: Family Medicine

## 2023-02-19 DIAGNOSIS — Z1231 Encounter for screening mammogram for malignant neoplasm of breast: Secondary | ICD-10-CM

## 2023-02-19 DIAGNOSIS — F5104 Psychophysiologic insomnia: Secondary | ICD-10-CM

## 2023-02-20 NOTE — Telephone Encounter (Signed)
Requested Prescriptions  Pending Prescriptions Disp Refills   hydrOXYzine (VISTARIL) 25 MG capsule [Pharmacy Med Name: HYDROXYZINE PAMOATE 25 MG CAP] 30 capsule 2    Sig: TAKE 1 CAPSULE BY MOUTH DAILY AS NEEDED     Ear, Nose, and Throat:  Antihistamines 2 Passed - 02/19/2023  9:49 AM      Passed - Cr in normal range and within 360 days    Creat  Date Value Ref Range Status  09/19/2022 0.80 0.50 - 1.05 mg/dL Final         Passed - Valid encounter within last 12 months    Recent Outpatient Visits           2 weeks ago Vaginal itching   [place] [facility] [place], Salvadore Oxford, NP   4 months ago Centrilobular emphysema [facility])   [place] [facility] Smitty Cords, DO   8 months ago Centrilobular emphysema [facility])   [place] [facility] D[place], [name], RPH-CPP   10 months ago Centrilobular emphysema [facility])   [place] [facility] Delles, [name], RPH-CPP   10 months ago Centrilobular emphysema [facility])   [place] [facility] Smitty Cords, DO       Future Appointments             In 1 month Althea Charon, Netta Neat, DO [place] [facility], [facility]

## 2023-02-25 ENCOUNTER — Other Ambulatory Visit: Payer: Self-pay | Admitting: Family Medicine

## 2023-02-25 DIAGNOSIS — J432 Centrilobular emphysema: Secondary | ICD-10-CM

## 2023-02-25 DIAGNOSIS — E039 Hypothyroidism, unspecified: Secondary | ICD-10-CM

## 2023-02-25 DIAGNOSIS — F5104 Psychophysiologic insomnia: Secondary | ICD-10-CM

## 2023-02-25 MED ORDER — HYDROXYZINE PAMOATE 25 MG PO CAPS
25.0000 mg | ORAL_CAPSULE | Freq: Every day | ORAL | 3 refills | Status: DC
Start: 2023-02-25 — End: 2024-03-15

## 2023-02-25 MED ORDER — ALBUTEROL SULFATE HFA 108 (90 BASE) MCG/ACT IN AERS
2.0000 | INHALATION_SPRAY | RESPIRATORY_TRACT | 11 refills | Status: DC | PRN
Start: 2023-02-25 — End: 2024-02-24

## 2023-03-07 ENCOUNTER — Ambulatory Visit (INDEPENDENT_AMBULATORY_CARE_PROVIDER_SITE_OTHER): Payer: Medicare Other

## 2023-03-07 VITALS — BP 114/70 | Ht 64.5 in | Wt 162.8 lb

## 2023-03-07 DIAGNOSIS — Z0000 Encounter for general adult medical examination without abnormal findings: Secondary | ICD-10-CM | POA: Diagnosis not present

## 2023-03-07 NOTE — Progress Notes (Signed)
Subjective:   Audrey Campbell is a 67 y.o. female who presents for Medicare Annual (Subsequent) preventive examination.  Visit Complete: In person  Cardiac Risk Factors include: advanced age (>29men, >51 women);dyslipidemia;hypertension;sedentary lifestyle;smoking/ tobacco exposure     Objective:    Today's Vitals   03/07/23 1041  BP: 114/70  Weight: 162 lb 12.8 oz (73.8 kg)  Height: 5' 4.5" (1.638 m)   Body mass index is 27.51 kg/m.     03/07/2023   10:50 AM 01/06/2023   11:05 AM 11/25/2022    7:59 AM 03/01/2022   11:23 AM 02/20/2021   11:01 AM 01/24/2021    7:22 AM 12/29/2019   10:17 AM  Advanced Directives  Does Patient Have a Medical Advance Directive? No No No No No No No  Would patient like information on creating a medical advance directive? No - Patient declined No - Patient declined No - Patient declined No - Patient declined   No - Patient declined    Current Medications (verified) Outpatient Encounter Medications as of 03/07/2023  Medication Sig   albuterol (ACCUNEB) 0.63 MG/3ML nebulizer solution Take 1 ampule by nebulization every 6 (six) hours as needed for wheezing.   albuterol (PROVENTIL) (2.5 MG/3ML) 0.083% nebulizer solution USE 1 VIAL IN NEBULIZER 4 TIMES DAILY - And As Needed   albuterol (VENTOLIN HFA) 108 (90 Base) MCG/ACT inhaler Inhale 2 puffs into the lungs every 4 (four) hours as needed for wheezing or shortness of breath.   BREZTRI AEROSPHERE 160-9-4.8 MCG/ACT AERO INHALE 2 PUFFS BY MOUTH INTO THE LUNGS IN THE MORNING AND AT BEDTIME   butalbital-acetaminophen-caffeine (FIORICET) 50-325-40 MG tablet Take 1 tablet by mouth every 6 (six) hours as needed for headache.   diclofenac Sodium (VOLTAREN) 1 % GEL Apply 2 g topically 4 (four) times daily as needed (arthritis knee pain).   gabapentin (NEURONTIN) 400 MG capsule TAKE 1 CAPSULE BY MOUTH 4 TIMES DAILY   hydrOXYzine (VISTARIL) 25 MG capsule Take 1 capsule (25 mg total) by mouth at bedtime.    levothyroxine (SYNTHROID) 100 MCG tablet Take 1 tablet (100 mcg total) by mouth daily before breakfast.   montelukast (SINGULAIR) 10 MG tablet TAKE 1 TABLET BY MOUTH DAILY   Oxycodone HCl 10 MG TABS Take 10 mg by mouth 4 (four) times daily as needed.   polyethylene glycol (MIRALAX / GLYCOLAX) 17 g packet Take 17 g by mouth daily as needed.   QUEtiapine (SEROQUEL) 100 MG tablet Take 2 tablets (200 mg total) by mouth at bedtime.   fluticasone (FLONASE) 50 MCG/ACT nasal spray Place 2 sprays into both nostrils daily. (Patient not taking: Reported on 02/05/2023)   No facility-administered encounter medications on file as of 03/07/2023.    Allergies (verified) Sulfa antibiotics and Penicillins   History: Past Medical History:  Diagnosis Date   Anxiety    Arthritis    Chronic pain syndrome    COPD (chronic obstructive pulmonary disease) (HCC)    Hyperlipidemia    Hypertension    Hypothyroidism    On home oxygen therapy    Plantar fasciitis    Past Surgical History:  Procedure Laterality Date   bulging disc in the neck     CATARACT EXTRACTION W/PHACO Right 11/25/2022   Procedure: CATARACT EXTRACTION PHACO AND INTRAOCULAR LENS PLACEMENT (IOC) RIGHT  11.84  01:01.5;  Surgeon: Nevada Crane, MD;  [facility]: [facility];  Service: Ophthalmology;  Laterality: Right;   CATARACT EXTRACTION W/PHACO Left 01/06/2023   Procedure: CATARACT EXTRACTION  PHACO AND INTRAOCULAR LENS PLACEMENT (IOC) LEFT  6.57  00:42.8;  Surgeon: Nevada Crane, MD;  Location: [facility];  Service: Ophthalmology;  Laterality: Left;   COLONOSCOPY WITH PROPOFOL N/A 12/29/2019   Procedure: COLONOSCOPY WITH PROPOFOL;  Surgeon: Pasty Spillers, MD;  Location: [facility];  Service: Endoscopy;  Laterality: N/A;   COLONOSCOPY WITH PROPOFOL N/A 01/24/2021   Procedure: COLONOSCOPY WITH PROPOFOL;  Surgeon: Pasty Spillers, MD;  Location: [facility];  Service: Endoscopy;  Laterality: N/A;   DILATION  AND CURETTAGE OF UTERUS     ESOPHAGOGASTRODUODENOSCOPY (EGD) WITH PROPOFOL N/A 12/29/2019   Procedure: ESOPHAGOGASTRODUODENOSCOPY (EGD) WITH PROPOFOL;  Surgeon: Pasty Spillers, MD;  Location: [facility];  Service: Endoscopy;  Laterality: N/A;   FOOT SURGERY Bilateral    heel  fasciitis     RHINOPLASTY     SHOULDER SURGERY     TUBAL LIGATION     Family History  Problem Relation Age of Onset   Diabetes Father    Stroke Father 88   Thyroid disease Father    Seizures Sister    Breast cancer Neg Hx    Social History   Socioeconomic History   Marital status: Widowed    Spouse name: Not on file   Number of children: Not on file   Years of education: high school   Highest education level: High school graduate  Occupational History   Not on file  Tobacco Use   Smoking status: Every Day    Current packs/day: 0.75    Average packs/day: 0.8 packs/day for 50.9 years (38.1 ttl pk-yrs)    Types: Cigarettes    Start date: 04/29/1972   Smokeless tobacco: Never  Vaping Use   Vaping status: Never Used  Substance and Sexual Activity   Alcohol use: No   Drug use: Yes    Types: Marijuana   Sexual activity: Yes    Birth control/protection: None  Other Topics Concern   Not on file  Social History Narrative   Not on file   Social Determinants of Health   Financial Resource Strain: Low Risk  (03/07/2023)   Overall Financial Resource Strain (CARDIA)    Difficulty of Paying Living Expenses: Not hard at all  Food Insecurity: No Food Insecurity (03/07/2023)   Hunger Vital Sign    Worried About Running Out of Food in the Last Year: Never true    Ran Out of Food in the Last Year: Never true  Transportation Needs: No Transportation Needs (03/07/2023)   PRAPARE - Administrator, Civil Service (Medical): No    Lack of Transportation (Non-Medical): No  Physical Activity: Inactive (03/07/2023)   Exercise Vital Sign    Days of Exercise per Week: 0 days    Minutes of Exercise per  Session: 0 min  Stress: No Stress Concern Present (03/07/2023)   [employer] of Occupational Health - Occupational Stress Questionnaire    Feeling of Stress : Not at all  Social Connections: Moderately Integrated (03/07/2023)   Social Connection and Isolation Panel [NHANES]    Frequency of Communication with Friends and Family: More than three times a week    Frequency of Social Gatherings with Friends and Family: Never    Attends Religious Services: More than 4 times per year    Active Member of [employer] or Organizations: No    Attends Banker Meetings: Never    Marital Status: Married    Tobacco Counseling Ready to quit: Not Answered Counseling  given: Not Answered   Clinical Intake:  Pre-visit preparation completed: Yes  Pain : No/denies pain     BMI - recorded: 27.51 Nutritional Status: BMI 25 -29 Overweight Nutritional Risks: None Diabetes: No  How often do you need to have someone help you when you read instructions, pamphlets, or other written materials from your doctor or pharmacy?: 1 - Never  Interpreter Needed?: No  Information entered by :: Kennedy Bucker, LPN   Activities of Daily Living    03/07/2023   10:51 AM 01/06/2023   11:06 AM  In your present state of health, do you have any difficulty performing the following activities:  Hearing? 0 0  Vision? 0 1  Difficulty concentrating or making decisions? 0 0  Walking or climbing stairs? 1 0  Comment gets [facility]   Dressing or bathing? 0 0  Doing errands, shopping? 0   Preparing Food and eating ? N   Using the Toilet? N   In the past six months, have you accidently leaked urine? N   Do you have problems with loss of bowel control? N   Managing your Medications? N   Managing your Finances? N   Housekeeping or managing your Housekeeping? N     Patient Care Team: Smitty Cords, DO as PCP - General (Family Medicine) Jim Like, RN as Registered Nurse Scarlett Presto, RN  (Inactive) as Registered Nurse Ronney Asters, Jackelyn Poling, RPH-CPP as Pharmacist  Indicate any recent Medical Services you may have received from other than [facility] providers in the past year (date may be approximate).     Assessment:   This is a routine wellness examination for Kyliegh.  Hearing/Vision screen Hearing Screening - Comments:: No aids Vision Screening - Comments:: Readers- Dr.Nice   Goals Addressed             This Visit's Progress    DIET - INCREASE WATER INTAKE         Depression Screen    03/07/2023   10:49 AM 09/26/2022   10:45 AM 03/28/2022   10:55 AM 03/01/2022   11:22 AM 09/21/2021    8:45 AM 05/25/2021    2:36 PM 02/20/2021   11:02 AM  PHQ 2/9 Scores  PHQ - 2 Score 0 0 1 0 1 1 0  PHQ- 9 Score 0 0 5 0 6 6     Fall Risk    03/07/2023   10:51 AM 09/26/2022   10:45 AM 03/28/2022   10:55 AM 03/01/2022   11:24 AM 09/21/2021    8:45 AM  Fall Risk   Falls in the past year? 0 0 0 0 0  Number falls in past yr: 0  0 0 0  Injury with Fall? 0  0 0 0  Risk for fall due to : No Fall Risks  No Fall Risks No Fall Risks No Fall Risks  Follow up Falls prevention discussed;Falls evaluation completed  Falls evaluation completed Falls prevention discussed;Falls evaluation completed Falls evaluation completed    MEDICARE RISK AT HOME: Medicare Risk at Home Any stairs in or around the home?: No If so, are there any without handrails?: No Home free of loose throw rugs in walkways, pet beds, electrical cords, etc?: Yes Adequate lighting in your home to reduce risk of falls?: Yes Life alert?: No Use of a cane, walker or w/c?: No Grab bars in the bathroom?: Yes Shower chair or bench in shower?: No Elevated toilet seat or a handicapped toilet?: No  TIMED  UP AND GO:  Was the test performed?  Yes  Length of time to ambulate 10 feet: 4 sec Gait steady and fast without use of assistive device    Cognitive Function:        03/07/2023   10:52 AM 03/01/2022   11:25 AM  02/20/2021   11:03 AM  6CIT Screen  What Year? 0 points 0 points 0 points  What month? 0 points 0 points 0 points  What time? 0 points 0 points 0 points  Count back from 20 0 points 0 points 0 points  Months in reverse 0 points 0 points 0 points  Repeat phrase 2 points 0 points 0 points  Total Score 2 points 0 points 0 points    Immunizations Immunization History  Administered Date(s) Administered   Influenza, High Dose Seasonal PF 02/01/2021   Influenza,inj,Quad PF,6+ Mos 01/04/2017, 12/31/2018   Influenza-Unspecified 02/09/2020, 01/27/2022, 01/27/2023   Moderna Covid-19 Vaccine Bivalent Booster 71yrs & up 02/01/2021, 01/27/2022   PFIZER(Purple Top)SARS-COV-2 Vaccination 07/15/2019, 08/11/2019, 02/21/2020   PNEUMOCOCCAL CONJUGATE-20 10/23/2020   Pneumococcal Polysaccharide-23 12/11/2017    TDAP status: Due, Education has been provided regarding the importance of this vaccine. Advised may receive this vaccine at local pharmacy or Health Dept. Aware to provide a copy of the vaccination record if obtained from local pharmacy or Health Dept. Verbalized acceptance and understanding.  Flu Vaccine status: Up to date  Pneumococcal vaccine status: Up to date  Covid-19 vaccine status: Completed vaccines  Qualifies for Shingles Vaccine? Yes   Zostavax completed No   Shingrix Completed?: No.    Education has been provided regarding the importance of this vaccine. Patient has been advised to call insurance company to determine out of pocket expense if they have not yet received this vaccine. Advised may also receive vaccine at local pharmacy or Health Dept. Verbalized acceptance and understanding.  Screening Tests Health Maintenance  Topic Date Due   Zoster Vaccines- Shingrix (1 of 2) Never done   Fecal DNA (Cologuard)  12/03/2022   COVID-19 Vaccine (6 - 2023-24 season) 12/29/2022   Lung Cancer Screening  12/27/2023   Medicare Annual Wellness (AWV)  03/06/2024   MAMMOGRAM  03/29/2024    Pneumonia Vaccine 47+ Years old  Completed   INFLUENZA VACCINE  Completed   DEXA SCAN  Completed   Hepatitis C Screening  Completed   HPV VACCINES  Aged Out   DTaP/Tdap/Td  Discontinued   Colonoscopy  Discontinued    Health Maintenance  Health Maintenance Due  Topic Date Due   Zoster Vaccines- Shingrix (1 of 2) Never done   Fecal DNA (Cologuard)  12/03/2022   COVID-19 Vaccine (6 - 2023-24 season) 12/29/2022    Colorectal cancer screening: Type of screening: Colonoscopy. Completed 01/24/21 per patient. Repeat every 3 years  Mammogram status: Completed SCHEDULED FOR 04/02/23. Repeat every year  Declined referral for BDS  Lung Cancer Screening: (Low Dose CT Chest recommended if Age 45-80 years, 20 pack-year currently smoking OR have quit w/in 15years.) does qualify.   Lung Cancer Screening Referral: CT scan 12/27/22  Additional Screening:  Hepatitis C Screening: does qualify; Completed 08/28/16  Vision Screening: Recommended annual ophthalmology exams for early detection of glaucoma and other disorders of the eye. Is the patient up to date with their annual eye exam?  Yes  Who is the provider or what is the name of the office in which the patient attends annual eye exams? Dr.Nice If pt is not established with a provider, would  they like to be referred to a provider to establish care? No .   Dental Screening: Recommended annual dental exams for proper oral hygiene  Community Resource Referral / Chronic Care Management: CRR required this visit?  No   CCM required this visit?  No     Plan:     I have personally reviewed and noted the following in the patient's chart:   Medical and social history Use of alcohol, tobacco or illicit drugs  Current medications and supplements including opioid prescriptions. Patient is currently taking opioid prescriptions. Information provided to patient regarding non-opioid alternatives. Patient advised to discuss non-opioid treatment plan  with their provider. Functional ability and status Nutritional status Physical activity Advanced directives List of other physicians Hospitalizations, surgeries, and ER visits in previous 12 months Vitals Screenings to include cognitive, depression, and falls Referrals and appointments  In addition, I have reviewed and discussed with patient certain preventive protocols, quality metrics, and best practice recommendations. A written personalized care plan for preventive services as well as general preventive health recommendations were provided to patient.     Hal Hope, LPN   13/[id number]/8657   After Visit Summary: (In Person-Declined) Patient declined AVS at this time.  Nurse Notes: none

## 2023-03-07 NOTE — Patient Instructions (Addendum)
Audrey Campbell , Thank you for taking time to come for your Medicare Wellness Visit. I appreciate your ongoing commitment to your health goals. Please review the following plan we discussed and let me know if I can assist you in the future.   Referrals/Orders/Follow-Ups/Clinician Recommendations: none  This is a list of the screening recommended for you and due dates:  Health Maintenance  Topic Date Due   Zoster (Shingles) Vaccine (1 of 2) Never done   Cologuard (Stool DNA test)  12/03/2022   COVID-19 Vaccine (6 - 2023-24 season) 12/29/2022   Screening for Lung Cancer  12/27/2023   Medicare Annual Wellness Visit  03/06/2024   Mammogram  03/29/2024   Pneumonia Vaccine  Completed   Flu Shot  Completed   DEXA scan (bone density measurement)  Completed   Hepatitis C Screening  Completed   HPV Vaccine  Aged Out   DTaP/Tdap/Td vaccine  Discontinued   Colon Cancer Screening  Discontinued    Advanced directives: (ACP Link)Information on Advanced Care Planning can be found at [facility] Advance Health Care Directives Advance Health Care Directives ([email or web address]   Next Medicare Annual Wellness Visit scheduled for next year: Yes  03/12/24 @ 10:50 am in person

## 2023-03-11 DIAGNOSIS — H35363 Drusen (degenerative) of macula, bilateral: Secondary | ICD-10-CM | POA: Diagnosis not present

## 2023-03-11 DIAGNOSIS — H5213 Myopia, bilateral: Secondary | ICD-10-CM | POA: Diagnosis not present

## 2023-03-11 DIAGNOSIS — Z961 Presence of intraocular lens: Secondary | ICD-10-CM | POA: Diagnosis not present

## 2023-03-11 DIAGNOSIS — H04123 Dry eye syndrome of bilateral lacrimal glands: Secondary | ICD-10-CM | POA: Diagnosis not present

## 2023-03-11 DIAGNOSIS — H52223 Regular astigmatism, bilateral: Secondary | ICD-10-CM | POA: Diagnosis not present

## 2023-03-11 DIAGNOSIS — H524 Presbyopia: Secondary | ICD-10-CM | POA: Diagnosis not present

## 2023-03-17 ENCOUNTER — Ambulatory Visit: Payer: Medicare Other | Admitting: Pharmacist

## 2023-03-17 DIAGNOSIS — J432 Centrilobular emphysema: Secondary | ICD-10-CM

## 2023-03-17 NOTE — Progress Notes (Signed)
03/17/2023 Name: Audrey Campbell MRN: [id number] DOB: [birth year]  Chief Complaint  Patient presents with   Medication Assistance    Audrey Campbell is a 67 y.o. year old female who presented for a telephone visit.   They were referred to the pharmacist by their PCP for assistance in managing medication access.      Subjective:   Care Team: Primary Care Provider: Smitty Cords, DO ; Next Scheduled Visit: 04/08/2023  Medication Access/Adherence  Current Pharmacy:  [facility] - [place], [place] - [address] [place] [address] Phone: [phone] Fax: [phone]  [facility] - [place], [place] - [address] [employer]. [address] [place] [address] Phone: [phone] Fax: [phone]   Patient reports affordability concerns with their medications: No  Patient reports access/transportation concerns to their pharmacy: No  Patient reports adherence concerns with their medications:  No     COPD:   Current medications: Breztri inhaler - 2 puffs into lungs twice daily Albuterol nebulizer solution/albuterol inhaler as needed   Confirms using Breztri inhaler - 2 puffs each morning and 2 puffs every evening as directed and rinsing out her mouth after each use   Current medication access support:  Enrolled in [place] patient assistance program through [place] through 04/29/2023 - Patient reports received extra shipments for [facility] and now has 14 inhalers from assistance program (> 1 year supply). Expiration dates on inhalers is for 2026. Confirms storing inhalers at room temperature away from moisture, heat or light     Tobacco Abuse: Reports currently smoking ~1/2 pack/day Previous therapies tried: gum, lozenge - did not like the taste, nicotine patches, Chantix Motivation: "I want to breathe more than I want to smoke"; bad taste of cigarettes Triggers: wanting to  hold cigarette; being around others who are smoking Reports recently had quit "cold [place]" for 3 months, but then started back smoking again Not ready to quit again today, but considering setting quit date again ~Christmas   Objective:   Lab Results  Component Value Date   CREATININE 0.80 09/19/2022   BUN 10 09/19/2022   NA 136 09/19/2022   K 4.5 09/19/2022   CL 98 09/19/2022   CO2 32 09/19/2022     Current Outpatient Medications on File Prior to Visit  Medication Sig Dispense Refill   albuterol (ACCUNEB) 0.63 MG/3ML nebulizer solution Take 1 ampule by nebulization every 6 (six) hours as needed for wheezing.     albuterol (PROVENTIL) (2.5 MG/3ML) 0.083% nebulizer solution USE 1 VIAL IN NEBULIZER 4 TIMES DAILY - And As Needed 120 mL 0   albuterol (VENTOLIN HFA) 108 (90 Base) MCG/ACT inhaler Inhale 2 puffs into the lungs every 4 (four) hours as needed for wheezing or shortness of breath. 8.5 g 11   BREZTRI AEROSPHERE 160-9-4.8 MCG/ACT AERO INHALE 2 PUFFS BY MOUTH INTO THE LUNGS IN THE MORNING AND AT BEDTIME 10.7 g 2   butalbital-acetaminophen-caffeine (FIORICET) 50-325-40 MG tablet Take 1 tablet by mouth every 6 (six) hours as needed for headache. 30 tablet 2   diclofenac Sodium (VOLTAREN) 1 % GEL Apply 2 g topically 4 (four) times daily as needed (arthritis knee pain). 100 g 2   fluticasone (FLONASE) 50 MCG/ACT nasal spray Place 2 sprays into both nostrils daily. (Patient not taking: Reported on 02/05/2023) 16 g 3   gabapentin (NEURONTIN) 400 MG capsule TAKE 1 CAPSULE BY MOUTH 4 TIMES DAILY 360 capsule 3  hydrOXYzine (VISTARIL) 25 MG capsule Take 1 capsule (25 mg total) by mouth at bedtime. 90 capsule 3   levothyroxine (SYNTHROID) 100 MCG tablet Take 1 tablet (100 mcg total) by mouth daily before breakfast. 90 tablet 3   montelukast (SINGULAIR) 10 MG tablet TAKE 1 TABLET BY MOUTH DAILY 90 tablet 1   Oxycodone HCl 10 MG TABS Take 10 mg by mouth 4 (four) times daily as needed.      polyethylene glycol (MIRALAX / GLYCOLAX) 17 g packet Take 17 g by mouth daily as needed.     QUEtiapine (SEROQUEL) 100 MG tablet Take 2 tablets (200 mg total) by mouth at bedtime. 180 tablet 1   No current facility-administered medications on file prior to visit.      Assessment/Plan:   Encourage patient to consider using weekly pillbox and daily phone alarms to aid with medication adherence  COPD: - Reviewed importance of rinsing mouth out after each use of Breztri - As requested, given that patient has > 1 year supply for Breztri remaining at home, will hold off on re-enrollment in patient assistance program through [place].   Tobacco Abuse: - Discuss benefits of smoking cessation - Patient not ready to set quit date today, but agrees to contact provider/clinical pharmacist if needed for support   Follow Up Plan: Clinical Pharmacist will follow up with patient by telephone on 03/01/2024 at 1:00 PM   Provide patient with contact information for clinic pharmacist to contact if needed sooner for medication questions/concerns   Estelle Grumbles, PharmD, P[place]y [place], CPP Clinical Pharmacist [facility] [phone]

## 2023-03-17 NOTE — Patient Instructions (Signed)
Goals Addressed             This Visit's Progress    Pharmacy Goals       Please check your home blood pressure, keep a log of the results and bring this with you to your medical appointments  Please consider using a weekly pillbox for organizing your medications  Please consider calling the [place] Quitline again.  The [place] Quitline phone number is: [phone]  Feel free to call me with any questions or concerns. I look forward to our next call!  Laila Myhre Delles, PharmD, BCACP, CPP Clinical Pharmacist [facility] [place] [phone]         

## 2023-03-20 DIAGNOSIS — J449 Chronic obstructive pulmonary disease, unspecified: Secondary | ICD-10-CM | POA: Diagnosis not present

## 2023-03-21 ENCOUNTER — Other Ambulatory Visit: Payer: Self-pay | Admitting: Family Medicine

## 2023-03-21 DIAGNOSIS — J302 Other seasonal allergic rhinitis: Secondary | ICD-10-CM

## 2023-03-21 DIAGNOSIS — R519 Headache, unspecified: Secondary | ICD-10-CM

## 2023-03-24 NOTE — Telephone Encounter (Signed)
Requested Prescriptions  Pending Prescriptions Disp Refills   montelukast (SINGULAIR) 10 MG tablet [Pharmacy Med Name: MONTELUKAST SODIUM 10 MG TAB] 90 tablet 1    Sig: TAKE 1 TABLET BY MOUTH DAILY     Pulmonology:  Leukotriene Inhibitors Passed - 03/21/2023  9:27 AM      Passed - Valid encounter within last 12 months    Recent Outpatient Visits           1 week ago Centrilobular emphysema (HCC)   [place] [facility] Delles, Jackelyn Poling, RPH-CPP   1 month ago Vaginal itching   [place] [facility] [place], Salvadore Oxford, NP   5 months ago Centrilobular emphysema [facility])   [place] [facility] Smitty Cords, DO   9 months ago Centrilobular emphysema [facility])   [place] [facility] Delles, [name], RPH-CPP   11 months ago Centrilobular emphysema (HCC)   [place] [facility] Delles, Jackelyn Poling, RPH-CPP       Future Appointments             In 2 weeks Althea Charon, Netta Neat, DO [place] [facility], [facility]             butalbital-acetaminophen-caffeine (FIORICET) 50-325-40 MG tablet [Pharmacy Med Name: BUTALBITAL-APAP-CAFFEINE 50-325-40] 30 tablet     Sig: TAKE 1 TABLET BY MOUTH EVERY 6 HOURS AS NEEDED FOR HEADACHE     Not Delegated - Analgesics:  Non-Opioid Analgesic Combinations 2 Failed - 03/21/2023  9:27 AM      Failed - This refill cannot be delegated      Passed - Cr in normal range and within 360 days    Creat  Date Value Ref Range Status  09/19/2022 0.80 0.50 - 1.05 mg/dL Final         Passed - eGFR is 10 or above and within 360 days    GFR, Est African American  Date Value Ref Range Status  10/16/2020 75 > OR = 60 mL/min/1.63m2 Final   GFR, Est Non African American  Date Value Ref Range Status  10/16/2020 64 > OR = 60 mL/min/1.6m2 Final   eGFR  Date Value Ref Range Status  09/19/2022 81 > OR = 60 mL/min/1.52m2 Final         Passed -  Patient is not pregnant      Passed - Valid encounter within last 12 months    Recent Outpatient Visits           1 week ago Centrilobular emphysema (HCC)   [place] Delles, Jackelyn Poling, RPH-CPP   1 month ago Vaginal itching   [place] [facility] [place], Salvadore Oxford, NP   5 months ago Centrilobular emphysema [facility])   [place] [facility] Smitty Cords, DO   9 months ago Centrilobular emphysema [facility])   [place] [facility] Delles, [name], RPH-CPP   11 months ago Centrilobular emphysema [facility])   [place] [facility] Delles, Jackelyn Poling, RPH-CPP       Future Appointments             In 2 weeks Althea Charon, Netta Neat, DO  [facility]

## 2023-03-24 NOTE — Telephone Encounter (Signed)
Requested medication (s) are due for refill today: yes  Requested medication (s) are on the active medication list: yes  Last refill:  06/17/22 #30 2 RF  Future visit scheduled: yes  Notes to clinic:  med not delegated to NT to RF   Requested Prescriptions  Pending Prescriptions Disp Refills   butalbital-acetaminophen-caffeine (FIORICET) 50-325-40 MG tablet [Pharmacy Med Name: BUTALBITAL-APAP-CAFFEINE 50-325-40] 30 tablet     Sig: TAKE 1 TABLET BY MOUTH EVERY 6 HOURS AS NEEDED FOR HEADACHE     Not Delegated - Analgesics:  Non-Opioid Analgesic Combinations 2 Failed - 03/21/2023  9:27 AM      Failed - This refill cannot be delegated      Passed - Cr in normal range and within 360 days    Creat  Date Value Ref Range Status  09/19/2022 0.80 0.50 - 1.05 mg/dL Final         Passed - eGFR is 10 or above and within 360 days    GFR, Est African American  Date Value Ref Range Status  10/16/2020 75 > OR = 60 mL/min/1.45m2 Final   GFR, Est Non African American  Date Value Ref Range Status  10/16/2020 64 > OR = 60 mL/min/1.86m2 Final   eGFR  Date Value Ref Range Status  09/19/2022 81 > OR = 60 mL/min/1.24m2 Final         Passed - Patient is not pregnant      Passed - Valid encounter within last 12 months    Recent Outpatient Visits           1 week ago Centrilobular emphysema (HCC)   [place] [facility] Delles, Jackelyn Poling, RPH-CPP   1 month ago Vaginal itching   [place] [facility] [place], Salvadore Oxford, NP   5 months ago Centrilobular emphysema [facility])   [place] [facility] Smitty Cords, DO   9 months ago Centrilobular emphysema [facility])   [place] [facility] Delles, [name], RPH-CPP   11 months ago Centrilobular emphysema [facility])   [place] [facility] Delles, Jackelyn Poling, RPH-CPP       Future Appointments             In 2 weeks Althea Charon, Netta Neat, DO [facility]            Signed Prescriptions Disp Refills   montelukast (SINGULAIR) 10 MG tablet 90 tablet 1    Sig: TAKE 1 TABLET BY MOUTH DAILY     Pulmonology:  Leukotriene Inhibitors Passed - 03/21/2023  9:27 AM      Passed - Valid encounter within last 12 months    Recent Outpatient Visits           1 week ago Centrilobular emphysema [facility])   [place] [facility] Delles, Jackelyn Poling, RPH-CPP   1 month ago Vaginal itching   [place] [facility], Salvadore Oxford, NP   5 months ago Centrilobular emphysema [facility])   [place] [facility] Smitty Cords, DO   9 months ago Centrilobular emphysema [facility])   [place] [facility] Delles, [name], RPH-CPP   11 months ago Centrilobular emphysema [facility])   [place] [facility] Delles, Jackelyn Poling, RPH-CPP       Future Appointments  In 2 weeks Althea Charon, Netta Neat, DO [place] [facility]

## 2023-03-29 DIAGNOSIS — G5601 Carpal tunnel syndrome, right upper limb: Secondary | ICD-10-CM | POA: Diagnosis not present

## 2023-04-01 ENCOUNTER — Other Ambulatory Visit: Payer: Medicare Other

## 2023-04-02 ENCOUNTER — Ambulatory Visit
Admission: RE | Admit: 2023-04-02 | Discharge: 2023-04-02 | Disposition: A | Discharge status: Home / Self Care | Payer: Medicare Other | Source: Ambulatory Visit | Attending: Family Medicine | Admitting: Family Medicine

## 2023-04-02 DIAGNOSIS — Z5941 Food insecurity: Secondary | ICD-10-CM | POA: Diagnosis not present

## 2023-04-02 DIAGNOSIS — M4802 Spinal stenosis, cervical region: Secondary | ICD-10-CM | POA: Diagnosis not present

## 2023-04-02 DIAGNOSIS — Z1231 Encounter for screening mammogram for malignant neoplasm of breast: Secondary | ICD-10-CM | POA: Insufficient documentation

## 2023-04-02 DIAGNOSIS — M9931 Osseous stenosis of neural canal of cervical region: Secondary | ICD-10-CM | POA: Diagnosis not present

## 2023-04-03 ENCOUNTER — Other Ambulatory Visit: Payer: Self-pay | Admitting: Orthopedic Surgery

## 2023-04-03 DIAGNOSIS — M4802 Spinal stenosis, cervical region: Secondary | ICD-10-CM

## 2023-04-07 ENCOUNTER — Other Ambulatory Visit: Payer: Medicare Other

## 2023-04-07 DIAGNOSIS — E782 Mixed hyperlipidemia: Secondary | ICD-10-CM

## 2023-04-07 DIAGNOSIS — R7309 Other abnormal glucose: Secondary | ICD-10-CM

## 2023-04-07 DIAGNOSIS — Z0000 Encounter for general adult medical examination without abnormal findings: Secondary | ICD-10-CM | POA: Diagnosis not present

## 2023-04-07 DIAGNOSIS — I10 Essential (primary) hypertension: Secondary | ICD-10-CM

## 2023-04-07 DIAGNOSIS — E039 Hypothyroidism, unspecified: Secondary | ICD-10-CM

## 2023-04-08 ENCOUNTER — Ambulatory Visit: Payer: Medicare Other | Admitting: Family Medicine

## 2023-04-08 LAB — CBC WITH DIFFERENTIAL/PLATELET
Absolute Lymphocytes: 2741 {cells}/uL (ref 850–3900)
Absolute Monocytes: 583 {cells}/uL (ref 200–950)
Basophils Absolute: 61 {cells}/uL (ref 0–200)
Basophils Relative: 0.7 %
Eosinophils Absolute: 200 {cells}/uL (ref 15–500)
Eosinophils Relative: 2.3 %
HCT: 37.3 % (ref 35.0–45.0)
Hemoglobin: 11.8 g/dL (ref 11.7–15.5)
MCH: 26.6 pg — ABNORMAL LOW (ref 27.0–33.0)
MCHC: 31.6 g/dL — ABNORMAL LOW (ref 32.0–36.0)
MCV: 84.2 fL (ref 80.0–100.0)
MPV: 10 fL (ref 7.5–12.5)
Monocytes Relative: 6.7 %
Neutro Abs: 5116 {cells}/uL (ref 1500–7800)
Neutrophils Relative %: 58.8 %
Platelets: 291 10*3/uL (ref 140–400)
RBC: 4.43 10*6/uL (ref 3.80–5.10)
RDW: 14.6 % (ref 11.0–15.0)
Total Lymphocyte: 31.5 %
WBC: 8.7 10*3/uL (ref 3.8–10.8)

## 2023-04-08 LAB — COMPLETE METABOLIC PANEL WITH GFR
AG Ratio: 1.3 (calc) (ref 1.0–2.5)
ALT: 9 U/L (ref 6–29)
AST: 16 U/L (ref 10–35)
Albumin: 3.9 g/dL (ref 3.6–5.1)
Alkaline phosphatase (APISO): 89 U/L (ref 37–153)
BUN: 11 mg/dL (ref 7–25)
CO2: 28 mmol/L (ref 20–32)
Calcium: 9.4 mg/dL (ref 8.6–10.4)
Chloride: 100 mmol/L (ref 98–110)
Creat: 0.77 mg/dL (ref 0.50–1.05)
Globulin: 3 g/dL (ref 1.9–3.7)
Glucose, Bld: 107 mg/dL — ABNORMAL HIGH (ref 65–99)
Potassium: 4.1 mmol/L (ref 3.5–5.3)
Sodium: 136 mmol/L (ref 135–146)
Total Bilirubin: 0.3 mg/dL (ref 0.2–1.2)
Total Protein: 6.9 g/dL (ref 6.1–8.1)
eGFR: 84 mL/min/{1.73_m2} (ref >=60)

## 2023-04-08 LAB — LIPID PANEL
Cholesterol: 183 mg/dL (ref <200)
HDL: 55 mg/dL (ref >=50)
LDL Cholesterol (Calc): 112 mg/dL — ABNORMAL HIGH
Non-HDL Cholesterol (Calc): 128 mg/dL (ref <130)
Total CHOL/HDL Ratio: 3.3 (calc) (ref <5.0)
Triglycerides: 73 mg/dL (ref <150)

## 2023-04-08 LAB — T4, FREE: Free T4: 1.2 ng/dL (ref 0.8–1.8)

## 2023-04-08 LAB — HEMOGLOBIN A1C
Hgb A1c MFr Bld: 5.7 %{Hb} — ABNORMAL HIGH (ref <5.7)
Mean Plasma Glucose: 117 mg/dL
eAG (mmol/L): 6.5 mmol/L

## 2023-04-08 LAB — TSH: TSH: 1.18 m[IU]/L (ref 0.40–4.50)

## 2023-04-10 NOTE — Progress Notes (Signed)
Patient notified of lab results. Stated she has been taking her medications.  Verbal understanding.   She will call if she needs anything further.

## 2023-04-17 DIAGNOSIS — M542 Cervicalgia: Secondary | ICD-10-CM | POA: Diagnosis not present

## 2023-04-17 DIAGNOSIS — G894 Chronic pain syndrome: Secondary | ICD-10-CM | POA: Diagnosis not present

## 2023-04-17 DIAGNOSIS — M25519 Pain in unspecified shoulder: Secondary | ICD-10-CM | POA: Diagnosis not present

## 2023-04-17 DIAGNOSIS — M722 Plantar fascial fibromatosis: Secondary | ICD-10-CM | POA: Diagnosis not present

## 2023-04-17 DIAGNOSIS — Z79891 Long term (current) use of opiate analgesic: Secondary | ICD-10-CM | POA: Diagnosis not present

## 2023-04-17 DIAGNOSIS — M25569 Pain in unspecified knee: Secondary | ICD-10-CM | POA: Diagnosis not present

## 2023-04-18 ENCOUNTER — Ambulatory Visit
Admission: RE | Admit: 2023-04-18 | Discharge: 2023-04-18 | Disposition: A | Discharge status: Home / Self Care | Payer: Medicare Other | Source: Ambulatory Visit | Attending: Orthopedic Surgery | Admitting: Orthopedic Surgery

## 2023-04-18 DIAGNOSIS — M47812 Spondylosis without myelopathy or radiculopathy, cervical region: Secondary | ICD-10-CM | POA: Diagnosis not present

## 2023-04-18 DIAGNOSIS — M4802 Spinal stenosis, cervical region: Secondary | ICD-10-CM | POA: Diagnosis not present

## 2023-04-19 DIAGNOSIS — J449 Chronic obstructive pulmonary disease, unspecified: Secondary | ICD-10-CM | POA: Diagnosis not present

## 2023-05-07 ENCOUNTER — Ambulatory Visit: Payer: Self-pay

## 2023-05-07 DIAGNOSIS — M542 Cervicalgia: Secondary | ICD-10-CM | POA: Diagnosis not present

## 2023-05-07 DIAGNOSIS — M5412 Radiculopathy, cervical region: Secondary | ICD-10-CM | POA: Diagnosis not present

## 2023-05-07 DIAGNOSIS — M9931 Osseous stenosis of neural canal of cervical region: Secondary | ICD-10-CM | POA: Diagnosis not present

## 2023-05-07 NOTE — Telephone Encounter (Signed)
  Chief Complaint: facial swelling Symptoms: facial swelling on L side, runny nose, L upper gums tender, elevated BP  Frequency: yesterday and today  Pertinent Negatives: Patient denies any SOB or pain  Disposition: [] ED /[] Urgent Care (no appt availability in office) / [x] Appointment(In office/virtual)/ []  [place] [facility]/ [] Home Care/ [] Refused Recommended Disposition /[] [place] [facility]/ []  Follow-up with PCP Additional Notes: pt states she has L sided facial swelling, comes and goes. Unsure what causing it. No tooth pain. Unsure if sinus related since having runny nose. Elevated BP today during OV at [facility]. First appt not until 05/12/23, offered VV 05/09/23. Pt accepted. States she has way to check BP at home. Advised of care advice and recommended calling back if sx get worse. Pt verbalized understanding.   Reason for Disposition  [1] Mild face swelling (puffiness) AND [2] persists > 3 days  Answer Assessment - Initial Assessment Questions 1. ONSET: When did the swelling start? (e.g., minutes, hours, days)     Yesterday  2. LOCATION: What part of the face is swollen?     L side of face  3. SEVERITY: How swollen is it?     Mild and moderate  4. ITCHING: Is there any itching? If Yes, ask: How much?   (Scale 1-10; mild, moderate or severe)     no 5. PAIN: Is the swelling painful to touch? If Yes, ask: How painful is it?   (Scale 1-10; mild, moderate or severe)   - NONE (0): no pain   - MILD (1-3): doesn't interfere with normal activities    - MODERATE (4-7): interferes with normal activities or awakens from sleep    - SEVERE (8-10): excruciating pain, unable to do any normal activities      No  6. FEVER: Do you have a fever? If Yes, ask: What is it, how was it measured, and when did it start?      no 9. OTHER SYMPTOMS: Do you have any other symptoms? (e.g., toothache, leg swelling)     BP 170, runny nose  Protocols used: Face Swelling-A-AH

## 2023-05-09 ENCOUNTER — Telehealth (INDEPENDENT_AMBULATORY_CARE_PROVIDER_SITE_OTHER): Payer: Medicare Other | Admitting: Internal Medicine

## 2023-05-09 ENCOUNTER — Encounter: Payer: Self-pay | Admitting: Internal Medicine

## 2023-05-09 DIAGNOSIS — J349 Unspecified disorder of nose and nasal sinuses: Secondary | ICD-10-CM | POA: Diagnosis not present

## 2023-05-09 DIAGNOSIS — R220 Localized swelling, mass and lump, head: Secondary | ICD-10-CM | POA: Diagnosis not present

## 2023-05-09 NOTE — Patient Instructions (Signed)

## 2023-05-09 NOTE — Progress Notes (Signed)
 Virtual Visit via Video Note  I connected with Audrey Campbell on 05/09/23 at 11:20 AM EST by a video enabled telemedicine application and verified that I am speaking with the correct person using two identifiers.  Location: Patient: Home Provider: Office  Persons participating in this video call: Angeline Laura, NP and Audrey Campbell   I discussed the limitations of evaluation and management by telemedicine and the availability of in person appointments. The patient expressed understanding and agreed to proceed.  History of Present Illness:     Discussed the use of AI scribe software for clinical note transcription with the patient, who gave verbal consent to proceed.   The patient presented with a recent episode of facial swelling and pain localized to the left side, extending from the upper gums to the lower face. The swelling was most prominent on waking up on two consecutive mornings (Tuesday and Wednesday), but had resolved by Thursday. Accompanying the swelling, the patient experienced significant pain in the upper gum area, which was tender to touch.  Concurrently, the patient reported a runny nose and a sensation of being cold, with a recorded temperature of 99.39F. The patient speculated that these symptoms might be related to a sinus issue, as the nasal discharge was predominantly from the left nostril.  As of the time of the consultation, all symptoms had resolved, and the patient reported no residual swelling, headache, sinus pressure, nasal congestion, ear pain, or dental pain.       Past Medical History:  Diagnosis Date   Anxiety    Arthritis    Chronic pain syndrome    COPD (chronic obstructive pulmonary disease) (HCC)    Hyperlipidemia    Hypertension    Hypothyroidism    On home oxygen  therapy    Plantar fasciitis     Current Outpatient Medications  Medication Sig Dispense Refill   albuterol  (ACCUNEB ) 0.63 MG/3ML nebulizer solution Take 1 ampule by  nebulization every 6 (six) hours as needed for wheezing.     albuterol  (PROVENTIL ) (2.5 MG/3ML) 0.083% nebulizer solution USE 1 VIAL IN NEBULIZER 4 TIMES DAILY - And As Needed 120 mL 0   albuterol  (VENTOLIN  HFA) 108 (90 Base) MCG/ACT inhaler Inhale 2 puffs into the lungs every 4 (four) hours as needed for wheezing or shortness of breath. 8.5 g 11   BREZTRI  AEROSPHERE 160-9-4.8 MCG/ACT AERO INHALE 2 PUFFS BY MOUTH INTO THE LUNGS IN THE MORNING AND AT BEDTIME 10.7 g 2   butalbital -acetaminophen -caffeine  (FIORICET) 50-325-40 MG tablet TAKE 1 TABLET BY MOUTH EVERY 6 HOURS AS NEEDED FOR HEADACHE 30 tablet 3   diclofenac  Sodium (VOLTAREN ) 1 % GEL Apply 2 g topically 4 (four) times daily as needed (arthritis knee pain). 100 g 2   fluticasone  (FLONASE ) 50 MCG/ACT nasal spray Place 2 sprays into both nostrils daily. (Patient not taking: Reported on 02/05/2023) 16 g 3   gabapentin  (NEURONTIN ) 400 MG capsule TAKE 1 CAPSULE BY MOUTH 4 TIMES DAILY 360 capsule 3   hydrOXYzine  (VISTARIL ) 25 MG capsule Take 1 capsule (25 mg total) by mouth at bedtime. 90 capsule 3   levothyroxine  (SYNTHROID ) 100 MCG tablet Take 1 tablet (100 mcg total) by mouth daily before breakfast. 90 tablet 3   montelukast  (SINGULAIR ) 10 MG tablet TAKE 1 TABLET BY MOUTH DAILY 90 tablet 1   Oxycodone  HCl 10 MG TABS Take 10 mg by mouth 4 (four) times daily as needed.     polyethylene glycol (MIRALAX  / GLYCOLAX ) 17 g packet Take 17  g by mouth daily as needed.     QUEtiapine  (SEROQUEL ) 100 MG tablet Take 2 tablets (200 mg total) by mouth at bedtime. 180 tablet 1   No current facility-administered medications for this visit.    Allergies  Allergen Reactions   Sulfa Antibiotics     Reports had reaction ~20 years ago   Penicillins Rash and Other (See Comments)    Has patient had a PCN reaction causing immediate rash, facial/tongue/throat swelling, SOB or lightheadedness with hypotension: No Has patient had a PCN reaction causing severe rash  involving mucus membranes or skin necrosis: No Has patient had a PCN reaction that required hospitalization: No Has patient had a PCN reaction occurring within the last 10 years: Yes If all of the above answers are NO, then may proceed with Cephalosporin use.     Family History  Problem Relation Age of Onset   Diabetes Father    Stroke Father 46   Thyroid  disease Father    Seizures Sister    Breast cancer Neg Hx     Social History   Socioeconomic History   Marital status: Widowed    Spouse name: Not on file   Number of children: Not on file   Years of education: high school   Highest education level: High school graduate  Occupational History   Not on file  Tobacco Use   Smoking status: Every Day    Current packs/day: 0.75    Average packs/day: 0.8 packs/day for 51.0 years (38.3 ttl pk-yrs)    Types: Cigarettes    Start date: 04/29/1972   Smokeless tobacco: Never  Vaping Use   Vaping status: Never Used  Substance and Sexual Activity   Alcohol use: No   Drug use: Yes    Types: Marijuana   Sexual activity: Yes    Birth control/protection: None  Other Topics Concern   Not on file  Social History Narrative   Not on file   Social Drivers of Health   Financial Resource Strain: Medium Risk (05/07/2023)   [facility] from [facility]   Overall Financial Resource Strain (CARDIA)    Difficulty of Paying Living Expenses: Somewhat hard  Food Insecurity: Food Insecurity Present (05/07/2023)   [facility] from [facility]   Hunger Vital Sign    Worried About Running Out of Food in the Last Year: Sometimes true    Ran Out of Food in the Last Year: Never true  Transportation Needs: No Transportation Needs (05/07/2023)   Received from [facility] - Transportation    In the past 12 months, has lack of transportation kept you from medical appointments or from getting medications?: No    Lack of Transportation (Non-Medical):  No  Physical Activity: Inactive (03/07/2023)   Exercise Vital Sign    Days of Exercise per Week: 0 days    Minutes of Exercise per Session: 0 min  Stress: No Stress Concern Present (03/07/2023)   [employer] of Occupational Health - Occupational Stress Questionnaire    Feeling of Stress : Not at all  Social Connections: Moderately Integrated (03/07/2023)   Social Connection and Isolation Panel [NHANES]    Frequency of Communication with Friends and Family: More than three times a week    Frequency of Social Gatherings with Friends and Family: Never    Attends Religious Services: More than 4 times per year    Active Member of Clubs or Organizations: No    Attends [employer]  or Organization Meetings: Never    Marital Status: Married  Catering Manager Violence: Not At Risk (03/07/2023)   Humiliation, Afraid, Rape, and Kick questionnaire    Fear of Current or Ex-Partner: No    Emotionally Abused: No    Physically Abused: No    Sexually Abused: No     Constitutional: Denies fever, malaise, fatigue, headache or abrupt weight changes.  HEENT: Patient reports sinus pressure, left side facial swelling and runny nose.  Denies eye pain, eye redness, ear pain, ringing in the ears, wax buildup, nasal congestion, bloody nose, or sore throat. Respiratory: Denies difficulty breathing, shortness of breath, cough or sputum production.   Cardiovascular: Denies chest pain, chest tightness, palpitations or swelling in the hands or feet.  Gastrointestinal: Denies abdominal pain, bloating, constipation, diarrhea or blood in the stool.  GU: Denies urgency, frequency, pain with urination, burning sensation, blood in urine, odor or discharge. Musculoskeletal: Denies decrease in range of motion, difficulty with gait, muscle pain or joint pain and swelling.  Skin: Denies redness, rashes, lesions or ulcercations.  Neurological: Denies dizziness, difficulty with memory, difficulty with speech or problems with balance  and coordination.  Psych: Denies anxiety, depression, SI/HI.  No other specific complaints in a complete review of systems (except as listed in HPI above).  Observations/Objective:  Wt Readings from Last 3 Encounters:  03/07/23 162 lb 12.8 oz (73.8 kg)  02/05/23 167 lb (75.8 kg)  01/06/23 166 lb 6.4 oz (75.5 kg)    General: Appears her stated age, well developed, well nourished in NAD. Skin: Warm, dry and intact. HEENT: Head: normal shape and size, no sinus pressure reported or facial swelling; Nose: non congestion noted; Throat/Mouth: no hoarseness noted.  Pulmonary/Chest: Normal effort. No respiratory distress.   Neurological: Alert and oriented.   BMET    Component Value Date/Time   NA 136 04/07/2023 0948   K 4.1 04/07/2023 0948   CL 100 04/07/2023 0948   CO2 28 04/07/2023 0948   GLUCOSE 107 (H) 04/07/2023 0948   BUN 11 04/07/2023 0948   CREATININE 0.77 04/07/2023 0948   CALCIUM  9.4 04/07/2023 0948   GFRNONAA 64 10/16/2020 0858   GFRAA 75 10/16/2020 0858    Lipid Panel     Component Value Date/Time   CHOL 183 04/07/2023 0948   TRIG 73 04/07/2023 0948   HDL 55 04/07/2023 0948   CHOLHDL 3.3 04/07/2023 0948   LDLCALC 112 (H) 04/07/2023 0948    CBC    Component Value Date/Time   WBC 8.7 04/07/2023 0948   RBC 4.43 04/07/2023 0948   HGB 11.8 04/07/2023 0948   HCT 37.3 04/07/2023 0948   PLT 291 04/07/2023 0948   MCV 84.2 04/07/2023 0948   MCH 26.6 (L) 04/07/2023 0948   MCHC 31.6 (L) 04/07/2023 0948   RDW 14.6 04/07/2023 0948   LYMPHSABS 2,646 09/19/2022 0829   MONOABS 0.4 12/09/2017 2359   EOSABS 200 04/07/2023 0948   BASOSABS 61 04/07/2023 0948    Hgb A1C Lab Results  Component Value Date   HGBA1C 5.7 (H) 04/07/2023       Assessment and Plan:  Assessment and Plan    Facial Swelling and Sinus Symptoms Resolved facial swelling and sinus symptoms. Possible sinus blockage that resolved spontaneously. No current signs of sinusitis or dental  infection. -If symptoms recur, use Flonase  in left nostril and consider taking an antihistamine. -If symptoms worsen over the weekend, consider urgent care. Otherwise, reconnect with us  on Monday if symptoms persist.  Follow-up with your PCP as previously scheduled  Follow Up Instructions:    I discussed the assessment and treatment plan with the patient. The patient was provided an opportunity to ask questions and all were answered. The patient agreed with the plan and demonstrated an understanding of the instructions.   The patient was advised to call back or seek an in-person evaluation if the symptoms worsen or if the condition fails to improve as anticipated.    Angeline Laura, NP

## 2023-05-20 ENCOUNTER — Ambulatory Visit: Payer: Self-pay

## 2023-05-20 DIAGNOSIS — J449 Chronic obstructive pulmonary disease, unspecified: Secondary | ICD-10-CM | POA: Diagnosis not present

## 2023-05-20 NOTE — Telephone Encounter (Signed)
  Chief Complaint: chest pain Symptoms: pt states she felt like she was having heart attack, chest pain, upper back pain, HA, sweating, SOB, coughing, lock jaw Frequency: yesterday, pt states sx went away yesterday evening and not present today  Pertinent Negatives: Patient denies any current sx  Disposition: [] ED /[] Urgent Care (no appt availability in office) / [x] Appointment(In office/virtual)/ []  [place] [facility]/ [] Home Care/ [] Refused Recommended Disposition /[] [place] [facility]/ []  Follow-up with PCP Additional Notes: scheduled pt for OV tomorrow at 320 with PCP. Advised pt if sx come back any at all to go to ED immediately for evaluation. Pt states sx resolved yesterday afternoon and haven't come back.   Reason for Disposition  [1] Chest pain lasts < 5 minutes AND [2] NO chest pain or cardiac symptoms (e.g., breathing difficulty, sweating) now  (Exception: Chest pains that last only a few seconds.)  Answer Assessment - Initial Assessment Questions 1. LOCATION: "Where does it hurt?"       Chest pain  2. RADIATION: "Does the pain go anywhere else?" (e.g., into neck, jaw, arms, back)     Back pain and HA  3. ONSET: "When did the chest pain begin?" (Minutes, hours or days)      Yesterday  4. PATTERN: "Does the pain come and go, or has it been constant since it started?"  "Does it get worse with exertion?"      Came x 2  5. DURATION: "How long does it last" (e.g., seconds, minutes, hours)     1 hr  6. SEVERITY: "How bad is the pain?"  (e.g., Scale 1-10; mild, moderate, or severe)    - MILD (1-3): doesn't interfere with normal activities     - MODERATE (4-7): interferes with normal activities or awakens from sleep    - SEVERE (8-10): excruciating pain, unable to do any normal activities       Severe  7. CARDIAC RISK FACTORS: "Do you have any history of heart problems or risk factors for heart disease?" (e.g., angina, prior heart attack; diabetes, high blood pressure, high  cholesterol, smoker, or strong family history of heart disease)     no 9. CAUSE: "What do you think is causing the chest pain?"     Pt thinks had heart attack  10. OTHER SYMPTOMS: "Do you have any other symptoms?" (e.g., dizziness, nausea, vomiting, sweating, fever, difficulty breathing, cough)       Lock jaw, sweating, coughing, SOB  Protocols used: Chest Pain-A-AH

## 2023-05-21 ENCOUNTER — Encounter: Payer: Self-pay | Admitting: Family Medicine

## 2023-05-21 ENCOUNTER — Ambulatory Visit (INDEPENDENT_AMBULATORY_CARE_PROVIDER_SITE_OTHER): Payer: Medicare Other | Admitting: Family Medicine

## 2023-05-21 VITALS — BP 140/90 | HR 82 | Ht 64.5 in | Wt 161.0 lb

## 2023-05-21 DIAGNOSIS — G2581 Restless legs syndrome: Secondary | ICD-10-CM | POA: Diagnosis not present

## 2023-05-21 DIAGNOSIS — R0789 Other chest pain: Secondary | ICD-10-CM

## 2023-05-21 DIAGNOSIS — R0600 Dyspnea, unspecified: Secondary | ICD-10-CM | POA: Diagnosis not present

## 2023-05-21 DIAGNOSIS — J432 Centrilobular emphysema: Secondary | ICD-10-CM

## 2023-05-21 MED ORDER — ROPINIROLE HCL 0.5 MG PO TABS: 0.5000 mg | ORAL_TABLET | Freq: Every evening | ORAL | 0 refills | Status: DC | PRN

## 2023-05-21 NOTE — Progress Notes (Addendum)
 Subjective:    Patient ID: Audrey Campbell, female    DOB: Mar 22, 1956, 68 y.o.   MRN: [id number]  Audrey Campbell is a 68 y.o. female presenting on 05/21/2023 for Shortness of Breath  Patient presents for a same day appointment.   HPI  Discussed the use of AI scribe software for clinical note transcription with the patient, who gave verbal consent to proceed.  History of Present Illness    The patient, with a known diagnosis of emphysema, presented with a sudden onset of dyspnea and chest pain that started on a Monday. The patient reported feeling unwell and unusually tired that morning, leading her to rest for a couple of hours. Upon getting up, the patient experienced a sudden inability to breathe after a brief outdoor excursion. This was accompanied by a sensation of pain radiating across the chest and up to the head, described as a wave-like sensation. Concurrently, the patient experienced what felt like lockjaw and upper back pain. The episode lasted for approximately five minutes, after which the patient reported feeling better.  However, shortly after this initial episode, the patient experienced a second episode of sudden breathlessness, described as a complete loss of oxygen, while walking to the back of the house. This episode was not preceded by coughing or any other identifiable trigger, unlike the first episode. The patient described feeling as if she were about to faint, a sensation she had never experienced before. The patient did not seek immediate medical attention for either episode, and no further episodes have been reported since.  The patient also reported occasional episodes of restless leg syndrome, which have been disruptive to her sleep. The patient expressed interest in exploring both prescription and over-the-counter treatment options for this condition.  The patient has a history of smoking, which she acknowledged during the consultation. The patient also has  access to oxygen at home but was unable to reach it during the episodes. The patient's blood pressure was noted to be slightly elevated at 142/90. The patient is currently on treatment for emphysema, including inhalers and breathing treatments.          03/07/2023   10:49 AM 09/26/2022   10:45 AM 03/28/2022   10:55 AM  Depression screen PHQ 2/9  Decreased Interest 0 0 1  Down, Depressed, Hopeless 0 0 0  PHQ - 2 Score 0 0 1  Altered sleeping 0 0 0  Tired, decreased energy 0 0 2  Change in appetite 0 0 2  Feeling bad or failure about yourself  0 0 0  Trouble concentrating 0 0 0  Moving slowly or fidgety/restless 0 0 0  Suicidal thoughts 0 0 0  PHQ-9 Score 0 0 5  Difficult doing work/chores Not difficult at all  Not difficult at all       09/26/2022   10:45 AM 03/28/2022   10:55 AM 09/21/2021    8:45 AM 05/25/2021    2:36 PM  GAD 7 : Generalized Anxiety Score  Nervous, Anxious, on Edge 0 0 1 1  Control/stop worrying 0 1 1 1   Worry too much - different things 0 1 2 2   Trouble relaxing 0 1 1 1   Restless 0 0 1 1  Easily annoyed or irritable 0 1 1 1   Afraid - awful might happen 0 1 1 1   Total GAD 7 Score 0 5 8 8   Anxiety Difficulty  Not difficult at all Somewhat difficult     Social History  Tobacco Use   Smoking status: Every Day    Current packs/day: 0.75    Average packs/day: 0.8 packs/day for 51.1 years (38.3 ttl pk-yrs)    Types: Cigarettes    Start date: 04/29/1972   Smokeless tobacco: Never  Vaping Use   Vaping status: Never Used  Substance Use Topics   Alcohol use: No   Drug use: Yes    Types: Marijuana    Review of Systems Per HPI unless specifically indicated above     Objective:    BP (!) 142/90   Pulse 82   Ht 5' 4.5" (1.638 m)   Wt 161 lb (73 kg)   SpO2 96%   BMI 27.21 kg/m   Wt Readings from Last 3 Encounters:  05/21/23 161 lb (73 kg)  03/07/23 162 lb 12.8 oz (73.8 kg)  02/05/23 167 lb (75.8 kg)    Physical Exam Vitals and nursing note  reviewed.  Constitutional:      General: She is not in acute distress.    Appearance: She is well-developed. She is not diaphoretic.     Comments: Well-appearing, comfortable, cooperative  HENT:     Head: Normocephalic and atraumatic.  Eyes:     General:        Right eye: No discharge.        Left eye: No discharge.     Conjunctiva/sclera: Conjunctivae normal.  Neck:     Thyroid: No thyromegaly.  Cardiovascular:     Rate and Rhythm: Normal rate and regular rhythm.     Heart sounds: Normal heart sounds. No murmur heard. Pulmonary:     Effort: Pulmonary effort is normal. No respiratory distress.     Breath sounds: Normal breath sounds. No wheezing or rales.     Comments: Speaks full sentences, good air movement Musculoskeletal:        General: Normal range of motion.     Cervical back: Normal range of motion and neck supple.  Lymphadenopathy:     Cervical: No cervical adenopathy.  Skin:    General: Skin is warm and dry.     Findings: No erythema or rash.  Neurological:     Mental Status: She is alert and oriented to person, place, and time.  Psychiatric:        Behavior: Behavior normal.     Comments: Well groomed, good eye contact, normal speech and thoughts     EKG - performed in office today  Date: 05/21/23  Rate: 75  Rhythm: normal sinus rhythm  QRS Axis: normal  Intervals: normal  ST/T Wave abnormalities: nonspecific T wave changes  Conduction Disutrbances:none  Additional Narrative Interpretation: none  Old EKG Reviewed: unchanged 2014    Results for orders placed or performed in visit on 04/07/23  T4, free   Collection Time: 04/07/23  9:48 AM  Result Value Ref Range   Free T4 1.2 0.8 - 1.8 ng/dL  TSH   Collection Time: 04/07/23  9:48 AM  Result Value Ref Range   TSH 1.18 0.40 - 4.50 mIU/L  Hemoglobin A1c   Collection Time: 04/07/23  9:48 AM  Result Value Ref Range   Hgb A1c MFr Bld 5.7 (H) <5.7 % of total Hgb   Mean Plasma Glucose 117 mg/dL   eAG  (mmol/L) 6.5 mmol/L  Lipid panel   Collection Time: 04/07/23  9:48 AM  Result Value Ref Range   Cholesterol 183 <200 mg/dL   HDL 55 > OR = 50 mg/dL   Triglycerides 73 <  150 mg/dL   LDL Cholesterol (Calc) 112 (H) mg/dL (calc)   Total CHOL/HDL Ratio 3.3 <5.0 (calc)   Non-HDL Cholesterol (Calc) 128 <130 mg/dL (calc)  CBC with Differential/Platelet   Collection Time: 04/07/23  9:48 AM  Result Value Ref Range   WBC 8.7 3.8 - 10.8 Thousand/uL   RBC 4.43 3.80 - 5.10 Million/uL   Hemoglobin 11.8 11.7 - 15.5 g/dL   HCT 16.1 09.6 - 04.5 %   MCV 84.2 80.0 - 100.0 fL   MCH 26.6 (L) 27.0 - 33.0 pg   MCHC 31.6 (L) 32.0 - 36.0 g/dL   RDW 40.9 81.1 - 91.4 %   Platelets 291 140 - 400 Thousand/uL   MPV 10.0 7.5 - 12.5 fL   Neutro Abs 5,116 1,500 - 7,800 cells/uL   Absolute Lymphocytes 2,741 850 - 3,900 cells/uL   Absolute Monocytes 583 200 - 950 cells/uL   Eosinophils Absolute 200 15 - 500 cells/uL   Basophils Absolute 61 0 - 200 cells/uL   Neutrophils Relative % 58.8 %   Total Lymphocyte 31.5 %   Monocytes Relative 6.7 %   Eosinophils Relative 2.3 %   Basophils Relative 0.7 %  COMPLETE METABOLIC PANEL WITH GFR   Collection Time: 04/07/23  9:48 AM  Result Value Ref Range   Glucose, Bld 107 (H) 65 - 99 mg/dL   BUN 11 7 - 25 mg/dL   Creat 7.82 9.56 - 2.13 mg/dL   eGFR 84 > OR = 60 YQ/MVH/8.46N6   BUN/Creatinine Ratio SEE NOTE: 6 - 22 (calc)   Sodium 136 135 - 146 mmol/L   Potassium 4.1 3.5 - 5.3 mmol/L   Chloride 100 98 - 110 mmol/L   CO2 28 20 - 32 mmol/L   Calcium 9.4 8.6 - 10.4 mg/dL   Total Protein 6.9 6.1 - 8.1 g/dL   Albumin 3.9 3.6 - 5.1 g/dL   Globulin 3.0 1.9 - 3.7 g/dL (calc)   AG Ratio 1.3 1.0 - 2.5 (calc)   Total Bilirubin 0.3 0.2 - 1.2 mg/dL   Alkaline phosphatase (APISO) 89 37 - 153 U/L   AST 16 10 - 35 U/L   ALT 9 6 - 29 U/L      Assessment & Plan:   Problem List Items Addressed This Visit     Centrilobular emphysema (HCC) - Primary   Relevant Orders    ECHOCARDIOGRAM COMPLETE   Other Visit Diagnoses       Dyspnea, unspecified type       Relevant Orders   ECHOCARDIOGRAM COMPLETE     Atypical chest pain       Relevant Orders   ECHOCARDIOGRAM COMPLETE     Restless leg syndrome       Relevant Medications   rOPINIRole (REQUIP) 0.5 MG tablet        Acute Dyspnea episode -resolved Acute Chest Pain - resolved COPD 2 days ago distinct x 2 episodes Sudden onset of chest pain, dyspnea, and presyncope. Chest X-ray compared to 2014 showed no significant changes.  EKG today, vitals reviewed no acute abnormality. There is some non specific T wave findings but no discernable etiology identified.  Ultimately we discussed likely a product of acute coughing spell with COPD and may have triggered this reaction with chest discomfort possible spasms and may have experience very brief hypoxia with coughing.  She is already on optimal management for COPD with Breztri triple inhaler and rescue inhalers, supplemental oxygen is available. She has had pulm imaging and followed  with pulm in past.  -Order echocardiogram to rule out cardiac etiology. -Consider referral to Cardiology based on echocardiogram results and any recurrent symptoms. -Consider referral to Pulmonology if recurrent episodes are determined to be due to lung pathology.  Very strict return precautions given if another sudden severe episode of dyspnea or chest pain or other concerning symptoms, advised to call 911 seek care EMS and evaluation at ED for full work up    Restless Leg Syndrome Occasional symptoms disrupting sleep. -Start over-the-counter Hyland's Restful Legs as needed. -Prescribe Ropinirole 0.5mg  as needed, with instructions to take 0.5-1mg  at onset of symptoms.      Orders Placed This Encounter  Procedures   EKG 12-Lead   ECHOCARDIOGRAM COMPLETE    Standing Status:   Future    Number of Occurrences:   1    Expiration Date:   05/20/2024    Where should this test  be performed:   [facility] [place]    Perflutren DEFINITY (image enhancing agent) should be administered unless hypersensitivity or allergy exist:   Administer Perflutren    Reason for exam-Echo:   Abnormal ECG  R94.31    Reason for exam-Echo:   Acutre respiratory distress R06.03    Release to patient:   Immediate    Meds ordered this encounter  Medications   rOPINIRole (REQUIP) 0.5 MG tablet    Sig: Take 1-2 tablets (0.5-1 mg total) by mouth at bedtime as needed (restless leg syndrome).    Dispense:  30 tablet    Refill:  0    Follow up plan: Return if symptoms worsen or fail to improve.   Saralyn Pilar, DO [facility] 05/21/2023, 3:38 PM

## 2023-05-21 NOTE — Patient Instructions (Addendum)
Thank you for coming to the office today.  Ordered ECHOCardiogram stay tuned for scheduling. Based on results and symptoms, we can refer to Cardiologist.  Consider also Pulmonology if recurrent episodes due to the lungs.  Most likely lung reaction with the coughing / low oxygen triggering the pain and spasm.   If question with the insurance  Call [facility] / Insurance [id number] or [phone]  Please schedule a Follow-up Appointment to: Return if symptoms worsen or fail to improve.  If you have any other questions or concerns, please feel free to call the office or send a message through MyChart. You may also schedule an earlier appointment if necessary.  Additionally, you may be receiving a survey about your experience at our office within a few days to 1 week by e-mail or mail. We value your feedback.  Saralyn Pilar, DO [facility] [place]

## 2023-05-22 DIAGNOSIS — M5412 Radiculopathy, cervical region: Secondary | ICD-10-CM | POA: Diagnosis not present

## 2023-05-22 DIAGNOSIS — R7303 Prediabetes: Secondary | ICD-10-CM | POA: Diagnosis not present

## 2023-05-28 ENCOUNTER — Other Ambulatory Visit: Payer: Self-pay | Admitting: Family Medicine

## 2023-05-28 DIAGNOSIS — F5104 Psychophysiologic insomnia: Secondary | ICD-10-CM

## 2023-05-29 ENCOUNTER — Other Ambulatory Visit: Payer: Self-pay | Admitting: Family Medicine

## 2023-05-29 DIAGNOSIS — F5104 Psychophysiologic insomnia: Secondary | ICD-10-CM

## 2023-05-29 NOTE — Telephone Encounter (Signed)
Medication Refill -  Most Recent Primary Care Visit:  Provider: Smitty Cords  Department: [facility]  Visit Type: OFFICE VISIT  Date: 05/21/2023  Medication: QUEtiapine (SEROQUEL) 100 MG tablet   Has the patient contacted their pharmacy? Yes (Agent: If no, request that the patient contact the pharmacy for the refill. If patient does not wish to contact the pharmacy document the reason why and proceed with request.) (Agent: If yes, when and what did the pharmacy advise?)  Is this the correct pharmacy for this prescription? Yes If no, delete pharmacy and type the correct one.  This is the patient's preferred pharmacy: [facility] - [place], [place] - [address] [place] [address] Phone: [phone] Fax: [phone]  Has the prescription been filled recently? No  Is the patient out of the medication? Yes  Has the patient been seen for an appointment in the last year OR does the patient have an upcoming appointment? Yes  Can we respond through MyChart? No  Agent: Please be advised that Rx refills may take up to 3 business days. We ask that you follow-up with your pharmacy.

## 2023-05-29 NOTE — Telephone Encounter (Signed)
Requested medications are due for refill today.  yes  Requested medications are on the active medications list.  yes  Last refill. 09/26/2022 #180 1 rf  Future visit scheduled.   no  Notes to clinic.  Refill not delegated.    Requested Prescriptions  Pending Prescriptions Disp Refills   QUEtiapine (SEROQUEL) 100 MG tablet [Pharmacy Med Name: QUETIAPINE FUMARATE 100 MG TAB] 180 tablet 1    Sig: TAKE 2 TABLETS BY MOUTH AT BEDTIME     Not Delegated - Psychiatry:  Antipsychotics - Second Generation (Atypical) - quetiapine Failed - 05/29/2023  3:08 PM      Failed - This refill cannot be delegated      Failed - Last BP in normal range    BP Readings from Last 1 Encounters:  05/21/23 (!) 140/90         Failed - Lipid Panel in normal range within the last 12 months    Cholesterol  Date Value Ref Range Status  04/07/2023 183 <200 mg/dL Final   LDL Cholesterol (Calc)  Date Value Ref Range Status  04/07/2023 112 (H) mg/dL (calc) Final    Comment:    Reference range: <100 . Desirable range <100 mg/dL for primary prevention;   <70 mg/dL for patients with CHD or diabetic patients  with > or = 2 CHD risk factors. Marland Kitchen LDL-C is now calculated using the Martin-Hopkins  calculation, which is a validated novel method providing  better accuracy than the Friedewald equation in the  estimation of LDL-C.  Horald Pollen et al. Lenox Ahr. 4098;119(14): 2061-2068  (http://education.[employer].com/faq/FAQ164)    HDL  Date Value Ref Range Status  04/07/2023 55 > OR = 50 mg/dL Final   Triglycerides  Date Value Ref Range Status  04/07/2023 73 <150 mg/dL Final         Failed - CMP within normal limits and completed in the last 12 months    No results found for: "ALBUMIN", "ALBMS", "POCALB", "ALBUMINELP" Alkaline phosphatase (APISO)  Date Value Ref Range Status  04/07/2023 89 37 - 153 U/L Final   ALT  Date Value Ref Range Status  04/07/2023 9 6 - 29 U/L Final   AST  Date Value Ref Range  Status  04/07/2023 16 10 - 35 U/L Final   BUN  Date Value Ref Range Status  04/07/2023 11 7 - 25 mg/dL Final   Calcium  Date Value Ref Range Status  04/07/2023 9.4 8.6 - 10.4 mg/dL Final   CO2  Date Value Ref Range Status  04/07/2023 28 20 - 32 mmol/L Final   Bicarbonate  Date Value Ref Range Status  12/10/2017 29.6 (H) 20.0 - 28.0 mmol/L Final   Creat  Date Value Ref Range Status  04/07/2023 0.77 0.50 - 1.05 mg/dL Final   Glucose, Bld  Date Value Ref Range Status  04/07/2023 107 (H) 65 - 99 mg/dL Final    Comment:    .            Fasting reference interval . For someone without known diabetes, a glucose value between 100 and 125 mg/dL is consistent with prediabetes and should be confirmed with a follow-up test. .    Potassium  Date Value Ref Range Status  04/07/2023 4.1 3.5 - 5.3 mmol/L Final   Sodium  Date Value Ref Range Status  04/07/2023 136 135 - 146 mmol/L Final   Total Bilirubin  Date Value Ref Range Status  04/07/2023 0.3 0.2 - 1.2 mg/dL Final   Total  Protein  Date Value Ref Range Status  04/07/2023 6.9 6.1 - 8.1 g/dL Final   GFR, Est African American  Date Value Ref Range Status  10/16/2020 75 > OR = 60 mL/min/1.92m2 Final   eGFR  Date Value Ref Range Status  04/07/2023 84 > OR = 60 mL/min/1.83m2 Final   GFR, Est Non African American  Date Value Ref Range Status  10/16/2020 64 > OR = 60 mL/min/1.77m2 Final         Passed - TSH in normal range and within 360 days    TSH  Date Value Ref Range Status  04/07/2023 1.18 0.40 - 4.50 mIU/L Final         Passed - Completed PHQ-2 or PHQ-9 in the last 360 days      Passed - Last Heart Rate in normal range    Pulse Readings from Last 1 Encounters:  05/21/23 82         Passed - Valid encounter within last 6 months    Recent Outpatient Visits           1 week ago Centrilobular emphysema (HCC)   [facility] [place], Netta Neat, DO   2 weeks ago Facial  swelling   [facility] [place], [place] W, NP   2 months ago Centrilobular emphysema [facility]   [facility] [place], [name], RPH-CPP   3 months ago Vaginal itching   [facility] [place], [place], NP   8 months ago Centrilobular emphysema [facility])   [facility] [place], Netta Neat, DO              Passed - CBC within normal limits and completed in the last 12 months    WBC  Date Value Ref Range Status  04/07/2023 8.7 3.8 - 10.8 Thousand/uL Final   RBC  Date Value Ref Range Status  04/07/2023 4.43 3.80 - 5.10 Million/uL Final   Hemoglobin  Date Value Ref Range Status  04/07/2023 11.8 11.7 - 15.5 g/dL Final   HCT  Date Value Ref Range Status  04/07/2023 37.3 35.0 - 45.0 % Final   MCHC  Date Value Ref Range Status  04/07/2023 31.6 (L) 32.0 - 36.0 g/dL Final    Comment:    For adults, a slight decrease in the calculated MCHC value (in the range of 30 to 32 g/dL) is most likely not clinically significant; however, it should be interpreted with caution in correlation with other red cell parameters and the patient's clinical condition.    [facility]  Date Value Ref Range Status  04/07/2023 26.6 (L) 27.0 - 33.0 pg Final   MCV  Date Value Ref Range Status  04/07/2023 84.2 80.0 - 100.0 fL Final   No results found for: "PLTCOUNTKUC", "LABPLAT", "POCPLA" RDW  Date Value Ref Range Status  04/07/2023 14.6 11.0 - 15.0 % Final

## 2023-05-30 NOTE — Telephone Encounter (Signed)
Requested medication (s) are due for refill today - no  Requested medication (s) are on the active medication list -yes  Future visit scheduled -no  Last refill: 05/29/23 #180 1RF  Notes to clinic: non delegated Rx  Requested Prescriptions  Pending Prescriptions Disp Refills   QUEtiapine (SEROQUEL) 100 MG tablet 180 tablet 1    Sig: Take 2 tablets (200 mg total) by mouth at bedtime.     Not Delegated - Psychiatry:  Antipsychotics - Second Generation (Atypical) - quetiapine Failed - 05/30/2023  8:57 AM      Failed - This refill cannot be delegated      Failed - Last BP in normal range    BP Readings from Last 1 Encounters:  05/21/23 (!) 140/90         Failed - Lipid Panel in normal range within the last 12 months    Cholesterol  Date Value Ref Range Status  04/07/2023 183 <200 mg/dL Final   LDL Cholesterol (Calc)  Date Value Ref Range Status  04/07/2023 112 (H) mg/dL (calc) Final    Comment:    Reference range: <100 . Desirable range <100 mg/dL for primary prevention;   <70 mg/dL for patients with CHD or diabetic patients  with > or = 2 CHD risk factors. Marland Kitchen LDL-C is now calculated using the Martin-Hopkins  calculation, which is a validated novel method providing  better accuracy than the Friedewald equation in the  estimation of LDL-C.  Horald Pollen et al. Lenox Ahr. 1610;960(45): 2061-2068  (http://education.[facility].com/faq/FAQ164)    HDL  Date Value Ref Range Status  04/07/2023 55 > OR = 50 mg/dL Final   Triglycerides  Date Value Ref Range Status  04/07/2023 73 <150 mg/dL Final         Failed - CMP within normal limits and completed in the last 12 months    No results found for: "ALBUMIN", "ALBMS", "POCALB", "ALBUMINELP" Alkaline phosphatase (APISO)  Date Value Ref Range Status  04/07/2023 89 37 - 153 U/L Final   ALT  Date Value Ref Range Status  04/07/2023 9 6 - 29 U/L Final   AST  Date Value Ref Range Status  04/07/2023 16 10 - 35 U/L Final    BUN  Date Value Ref Range Status  04/07/2023 11 7 - 25 mg/dL Final   Calcium  Date Value Ref Range Status  04/07/2023 9.4 8.6 - 10.4 mg/dL Final   CO2  Date Value Ref Range Status  04/07/2023 28 20 - 32 mmol/L Final   Bicarbonate  Date Value Ref Range Status  12/10/2017 29.6 (H) 20.0 - 28.0 mmol/L Final   Creat  Date Value Ref Range Status  04/07/2023 0.77 0.50 - 1.05 mg/dL Final   Glucose, Bld  Date Value Ref Range Status  04/07/2023 107 (H) 65 - 99 mg/dL Final    Comment:    .            Fasting reference interval . For someone without known diabetes, a glucose value between 100 and 125 mg/dL is consistent with prediabetes and should be confirmed with a follow-up test. .    Potassium  Date Value Ref Range Status  04/07/2023 4.1 3.5 - 5.3 mmol/L Final   Sodium  Date Value Ref Range Status  04/07/2023 136 135 - 146 mmol/L Final   Total Bilirubin  Date Value Ref Range Status  04/07/2023 0.3 0.2 - 1.2 mg/dL Final   Total Protein  Date Value Ref Range Status  04/07/2023 6.9  6.1 - 8.1 g/dL Final   GFR, Est African American  Date Value Ref Range Status  10/16/2020 75 > OR = 60 mL/min/1.34m2 Final   eGFR  Date Value Ref Range Status  04/07/2023 84 > OR = 60 mL/min/1.9m2 Final   GFR, Est Non African American  Date Value Ref Range Status  10/16/2020 64 > OR = 60 mL/min/1.46m2 Final         Passed - TSH in normal range and within 360 days    TSH  Date Value Ref Range Status  04/07/2023 1.18 0.40 - 4.50 mIU/L Final         Passed - Completed PHQ-2 or PHQ-9 in the last 360 days      Passed - Last Heart Rate in normal range    Pulse Readings from Last 1 Encounters:  05/21/23 82         Passed - Valid encounter within last 6 months    Recent Outpatient Visits           1 week ago Centrilobular emphysema (HCC)   [facility] [place], Netta Neat, DO   3 weeks ago Facial swelling   [facility] [place], [place] W, NP   2 months ago Centrilobular emphysema [facility])   [facility] [place], [name], RPH-CPP   3 months ago Vaginal itching   [facility] [place], [place] W, NP   8 months ago Centrilobular emphysema [facility])   [facility] [place], Netta Neat, DO              Passed - CBC within normal limits and completed in the last 12 months    WBC  Date Value Ref Range Status  04/07/2023 8.7 3.8 - 10.8 Thousand/uL Final   RBC  Date Value Ref Range Status  04/07/2023 4.43 3.80 - 5.10 Million/uL Final   Hemoglobin  Date Value Ref Range Status  04/07/2023 11.8 11.7 - 15.5 g/dL Final   HCT  Date Value Ref Range Status  04/07/2023 37.3 35.0 - 45.0 % Final   MCHC  Date Value Ref Range Status  04/07/2023 31.6 (L) 32.0 - 36.0 g/dL Final    Comment:    For adults, a slight decrease in the calculated MCHC value (in the range of 30 to 32 g/dL) is most likely not clinically significant; however, it should be interpreted with caution in correlation with other red cell parameters and the patient's clinical condition.    [facility]  Date Value Ref Range Status  04/07/2023 26.6 (L) 27.0 - 33.0 pg Final   MCV  Date Value Ref Range Status  04/07/2023 84.2 80.0 - 100.0 fL Final   No results found for: "PLTCOUNTKUC", "LABPLAT", "POCPLA" RDW  Date Value Ref Range Status  04/07/2023 14.6 11.0 - 15.0 % Final            Requested Prescriptions  Pending Prescriptions Disp Refills   QUEtiapine (SEROQUEL) 100 MG tablet 180 tablet 1    Sig: Take 2 tablets (200 mg total) by mouth at bedtime.     Not Delegated - Psychiatry:  Antipsychotics - Second Generation (Atypical) - quetiapine Failed - 05/30/2023  8:57 AM      Failed - This refill cannot be delegated      Failed - Last BP in normal range    BP Readings from Last 1 Encounters:  05/21/23 (!) 140/90  Failed - Lipid Panel  in normal range within the last 12 months    Cholesterol  Date Value Ref Range Status  04/07/2023 183 <200 mg/dL Final   LDL Cholesterol (Calc)  Date Value Ref Range Status  04/07/2023 112 (H) mg/dL (calc) Final    Comment:    Reference range: <100 . Desirable range <100 mg/dL for primary prevention;   <70 mg/dL for patients with CHD or diabetic patients  with > or = 2 CHD risk factors. Marland Kitchen LDL-C is now calculated using the Martin-Hopkins  calculation, which is a validated novel method providing  better accuracy than the Friedewald equation in the  estimation of LDL-C.  Horald Pollen et al. Lenox Ahr. 1610;960(45): 2061-2068  (http://education.[facility].com/faq/FAQ164)    HDL  Date Value Ref Range Status  04/07/2023 55 > OR = 50 mg/dL Final   Triglycerides  Date Value Ref Range Status  04/07/2023 73 <150 mg/dL Final         Failed - CMP within normal limits and completed in the last 12 months    No results found for: "ALBUMIN", "ALBMS", "POCALB", "ALBUMINELP" Alkaline phosphatase (APISO)  Date Value Ref Range Status  04/07/2023 89 37 - 153 U/L Final   ALT  Date Value Ref Range Status  04/07/2023 9 6 - 29 U/L Final   AST  Date Value Ref Range Status  04/07/2023 16 10 - 35 U/L Final   BUN  Date Value Ref Range Status  04/07/2023 11 7 - 25 mg/dL Final   Calcium  Date Value Ref Range Status  04/07/2023 9.4 8.6 - 10.4 mg/dL Final   CO2  Date Value Ref Range Status  04/07/2023 28 20 - 32 mmol/L Final   Bicarbonate  Date Value Ref Range Status  12/10/2017 29.6 (H) 20.0 - 28.0 mmol/L Final   Creat  Date Value Ref Range Status  04/07/2023 0.77 0.50 - 1.05 mg/dL Final   Glucose, Bld  Date Value Ref Range Status  04/07/2023 107 (H) 65 - 99 mg/dL Final    Comment:    .            Fasting reference interval . For someone without known diabetes, a glucose value between 100 and 125 mg/dL is consistent with prediabetes and should be confirmed with a follow-up  test. .    Potassium  Date Value Ref Range Status  04/07/2023 4.1 3.5 - 5.3 mmol/L Final   Sodium  Date Value Ref Range Status  04/07/2023 136 135 - 146 mmol/L Final   Total Bilirubin  Date Value Ref Range Status  04/07/2023 0.3 0.2 - 1.2 mg/dL Final   Total Protein  Date Value Ref Range Status  04/07/2023 6.9 6.1 - 8.1 g/dL Final   GFR, Est African American  Date Value Ref Range Status  10/16/2020 75 > OR = 60 mL/min/1.71m2 Final   eGFR  Date Value Ref Range Status  04/07/2023 84 > OR = 60 mL/min/1.47m2 Final   GFR, Est Non African American  Date Value Ref Range Status  10/16/2020 64 > OR = 60 mL/min/1.40m2 Final         Passed - TSH in normal range and within 360 days    TSH  Date Value Ref Range Status  04/07/2023 1.18 0.40 - 4.50 mIU/L Final         Passed - Completed PHQ-2 or PHQ-9 in the last 360 days      Passed - Last Heart Rate in normal range  Pulse Readings from Last 1 Encounters:  05/21/23 82         Passed - Valid encounter within last 6 months    Recent Outpatient Visits           1 week ago Centrilobular emphysema (HCC)   [place] [facility] [place], Netta Neat, DO   3 weeks ago Facial swelling   [facility] [place], Salvadore [place], NP   2 months ago Centrilobular emphysema [facility])   [facility] Delles, [name], RPH-CPP   3 months ago Vaginal itching   [facility] [place], Salvadore Oxford, NP   8 months ago Centrilobular emphysema [facility])    [facility] [place], Netta Neat, DO              Passed - CBC within normal limits and completed in the last 12 months    WBC  Date Value Ref Range Status  04/07/2023 8.7 3.8 - 10.8 Thousand/uL Final   RBC  Date Value Ref Range Status  04/07/2023 4.43 3.80 - 5.10 Million/uL Final   Hemoglobin  Date Value Ref Range Status  04/07/2023 11.8 11.7 - 15.5 g/dL Final    HCT  Date Value Ref Range Status  04/07/2023 37.3 35.0 - 45.0 % Final   MCHC  Date Value Ref Range Status  04/07/2023 31.6 (L) 32.0 - 36.0 g/dL Final    Comment:    For adults, a slight decrease in the calculated MCHC value (in the range of 30 to 32 g/dL) is most likely not clinically significant; however, it should be interpreted with caution in correlation with other red cell parameters and the patient's clinical condition.    [facility]  Date Value Ref Range Status  04/07/2023 26.6 (L) 27.0 - 33.0 pg Final   MCV  Date Value Ref Range Status  04/07/2023 84.2 80.0 - 100.0 fL Final   No results found for: "PLTCOUNTKUC", "LABPLAT", "POCPLA" RDW  Date Value Ref Range Status  04/07/2023 14.6 11.0 - 15.0 % Final

## 2023-06-04 DIAGNOSIS — M542 Cervicalgia: Secondary | ICD-10-CM | POA: Diagnosis not present

## 2023-06-04 DIAGNOSIS — M9931 Osseous stenosis of neural canal of cervical region: Secondary | ICD-10-CM | POA: Diagnosis not present

## 2023-06-04 DIAGNOSIS — M5412 Radiculopathy, cervical region: Secondary | ICD-10-CM | POA: Diagnosis not present

## 2023-06-06 ENCOUNTER — Ambulatory Visit: Payer: Medicare Other | Attending: Family Medicine

## 2023-06-06 DIAGNOSIS — J432 Centrilobular emphysema: Secondary | ICD-10-CM | POA: Diagnosis not present

## 2023-06-06 DIAGNOSIS — R0600 Dyspnea, unspecified: Secondary | ICD-10-CM

## 2023-06-06 DIAGNOSIS — R0789 Other chest pain: Secondary | ICD-10-CM

## 2023-06-06 LAB — ECHOCARDIOGRAM COMPLETE
AV Mean grad: 5 mm[Hg]
AV Peak grad: 9.7 mm[Hg]
Ao pk vel: 1.56 m/s
Area-P 1/2: 3.85 cm2

## 2023-06-10 ENCOUNTER — Telehealth: Payer: Self-pay

## 2023-06-10 NOTE — Telephone Encounter (Signed)
Copied from CRM [id number]. Topic: General - Other >> Jun 10, 2023  8:55 AM Tiffany B wrote: Reason for CRM: Caller states she's returning the practice call and she thinks its about her results, Chart does not reflect that someone reach out to her.

## 2023-06-12 DIAGNOSIS — Z79891 Long term (current) use of opiate analgesic: Secondary | ICD-10-CM | POA: Diagnosis not present

## 2023-06-12 DIAGNOSIS — M25569 Pain in unspecified knee: Secondary | ICD-10-CM | POA: Diagnosis not present

## 2023-06-12 DIAGNOSIS — M545 Low back pain, unspecified: Secondary | ICD-10-CM | POA: Diagnosis not present

## 2023-06-12 DIAGNOSIS — M25519 Pain in unspecified shoulder: Secondary | ICD-10-CM | POA: Diagnosis not present

## 2023-06-12 DIAGNOSIS — M542 Cervicalgia: Secondary | ICD-10-CM | POA: Diagnosis not present

## 2023-06-12 DIAGNOSIS — M722 Plantar fascial fibromatosis: Secondary | ICD-10-CM | POA: Diagnosis not present

## 2023-06-12 DIAGNOSIS — M5412 Radiculopathy, cervical region: Secondary | ICD-10-CM | POA: Diagnosis not present

## 2023-06-12 DIAGNOSIS — G894 Chronic pain syndrome: Secondary | ICD-10-CM | POA: Diagnosis not present

## 2023-06-26 ENCOUNTER — Other Ambulatory Visit: Payer: Self-pay

## 2023-06-26 DIAGNOSIS — R0789 Other chest pain: Secondary | ICD-10-CM

## 2023-07-18 NOTE — Addendum Note (Signed)
 Addended by: Smitty Cords on: 07/18/2023 04:39 PM   Modules accepted: Orders

## 2023-07-23 DIAGNOSIS — Z79891 Long term (current) use of opiate analgesic: Secondary | ICD-10-CM | POA: Diagnosis not present

## 2023-08-07 DIAGNOSIS — M25569 Pain in unspecified knee: Secondary | ICD-10-CM | POA: Diagnosis not present

## 2023-08-07 DIAGNOSIS — M545 Low back pain, unspecified: Secondary | ICD-10-CM | POA: Diagnosis not present

## 2023-08-07 DIAGNOSIS — Z79891 Long term (current) use of opiate analgesic: Secondary | ICD-10-CM | POA: Diagnosis not present

## 2023-08-07 DIAGNOSIS — M722 Plantar fascial fibromatosis: Secondary | ICD-10-CM | POA: Diagnosis not present

## 2023-08-07 DIAGNOSIS — G894 Chronic pain syndrome: Secondary | ICD-10-CM | POA: Diagnosis not present

## 2023-08-07 DIAGNOSIS — M25519 Pain in unspecified shoulder: Secondary | ICD-10-CM | POA: Diagnosis not present

## 2023-08-19 ENCOUNTER — Other Ambulatory Visit: Payer: Self-pay | Admitting: Family Medicine

## 2023-08-19 DIAGNOSIS — J432 Centrilobular emphysema: Secondary | ICD-10-CM

## 2023-08-19 NOTE — Telephone Encounter (Signed)
 Copied from CRM [id number]. Topic: Clinical - Medication Refill >> Aug 19, 2023  1:03 PM Rosaria Common wrote: Most Recent Primary Care Visit:  Provider: Raina Bunting  Department: [id number]  Visit Type: OFFICE VISIT  Date: 05/21/2023  Medication: albuterol  (ACCUNEB ) 0.63 MG/3ML nebulizer solution  Has the patient contacted their pharmacy? Yes (Agent: If no, request that the patient contact the pharmacy for the refill. If patient does not wish to contact the pharmacy document the reason why and proceed with request.) (Agent: If yes, when and what did the pharmacy advise?)  Is this the correct pharmacy for this prescription? Yes If no, delete pharmacy and type the correct one.  This is the patient's preferred pharmacy:    [facility] - [place], [place] - [address] [employer] [place] [address] Phone: [phone] Fax: [phone]   Has the prescription been filled recently? No  Is the patient out of the medication? No  Has the patient been seen for an appointment in the last year OR does the patient have an upcoming appointment? Yes  Can we respond through MyChart? No  Agent: Please be advised that Rx refills may take up to 3 business days. We ask that you follow-up with your pharmacy.

## 2023-08-20 MED ORDER — ALBUTEROL SULFATE 0.63 MG/3ML IN NEBU: 1.0000 | INHALATION_SOLUTION | Freq: Four times a day (QID) | RESPIRATORY_TRACT | 5 refills | Status: DC | PRN

## 2023-08-20 NOTE — Telephone Encounter (Signed)
 Requested medication (s) are due for refill today: yes  Requested medication (s) are on the active medication list: yes  Last refill:  09/27/20  Future visit scheduled: no  Notes to clinic:  Unable to refill per protocol, last refill by another provider.      Requested Prescriptions  Pending Prescriptions Disp Refills   albuterol  (ACCUNEB ) 0.63 MG/3ML nebulizer solution 75 mL     Sig: Take 3 mLs (0.63 mg total) by nebulization every 6 (six) hours as needed for wheezing.     Pulmonology:  Beta Agonists 2 Failed - 08/20/2023 11:45 AM      Failed - Last BP in normal range    BP Readings from Last 1 Encounters:  05/21/23 (!) 140/90         Failed - Valid encounter within last 12 months    Recent Outpatient Visits   None            Passed - Last Heart Rate in normal range    Pulse Readings from Last 1 Encounters:  05/21/23 82

## 2023-08-27 ENCOUNTER — Telehealth: Payer: Self-pay | Admitting: Family Medicine

## 2023-08-27 ENCOUNTER — Other Ambulatory Visit: Payer: Self-pay

## 2023-08-27 DIAGNOSIS — J432 Centrilobular emphysema: Secondary | ICD-10-CM

## 2023-08-27 MED ORDER — ALBUTEROL SULFATE 0.63 MG/3ML IN NEBU
1.0000 | INHALATION_SOLUTION | Freq: Four times a day (QID) | RESPIRATORY_TRACT | 5 refills | Status: DC | PRN
Start: 2023-08-27 — End: 2023-08-27

## 2023-08-27 MED ORDER — ALBUTEROL SULFATE 0.63 MG/3ML IN NEBU: 1.0000 | INHALATION_SOLUTION | Freq: Four times a day (QID) | RESPIRATORY_TRACT | 5 refills | Status: DC | PRN

## 2023-08-27 NOTE — Telephone Encounter (Signed)
 RN called [employer] for more information. RN was unable to get through and speak to a pharmacist for more information.  Copied from CRM [id number]. Topic: Clinical - Prescription Issue >> Aug 27, 2023  9:14 AM Martinique E wrote: Reason for CRM: Patient called in stating that [facility] has not received the order for her prescription refill of albuterol  (ACCUNEB ) 0.63 MG/3ML nebulizer solution. Patient stated to fax this request to [phone] instead.

## 2023-08-27 NOTE — Telephone Encounter (Signed)
 Rx resent/printed and faxed.

## 2023-09-08 ENCOUNTER — Telehealth: Payer: Self-pay

## 2023-09-08 DIAGNOSIS — J432 Centrilobular emphysema: Secondary | ICD-10-CM

## 2023-09-08 MED ORDER — ALBUTEROL SULFATE 0.63 MG/3ML IN NEBU: 1.0000 | INHALATION_SOLUTION | Freq: Four times a day (QID) | RESPIRATORY_TRACT | 5 refills | Status: DC | PRN

## 2023-09-08 NOTE — Telephone Encounter (Signed)
 I just completed all faxed orders in my inbox and I do not see this [facility] order. Can you request a new one or perhaps they need a verbal?  Domingo Friend, DO [facility] 09/08/2023, 12:26 PM

## 2023-09-08 NOTE — Addendum Note (Signed)
 Addended by: Raina Bunting on: 09/08/2023 01:21 PM   Modules accepted: Orders

## 2023-09-08 NOTE — Telephone Encounter (Signed)
 Okay I will send it to her local pharmacy.  Also, it sounds like if [facility] was filling a different version of her Albuterol  nebulizer solution, then we can always switch it to whichever version they stock instead. I don't see a reason it has to be this exact version.  We can see what her pharmacy says first  Domingo Friend, DO [facility] 09/08/2023, 1:21 PM

## 2023-09-08 NOTE — Telephone Encounter (Signed)
 Copied from CRM [id number]. Topic: Clinical - Prescription Issue >> Sep 08, 2023 11:27 AM Zipporah Him wrote: Reason for CRM: albuterol  (ACCUNEB ) 0.63 MG/3ML nebulizer solution, patient waiting on a refill and getting worried, states only 5 days left. She said that the prescription has been waiting on a signature from Dr. Linnell Richardson. Please call the patient ASAP to clarify this message has been received.

## 2023-09-29 ENCOUNTER — Telehealth: Payer: Self-pay

## 2023-09-29 DIAGNOSIS — J432 Centrilobular emphysema: Secondary | ICD-10-CM

## 2023-09-29 MED ORDER — ALBUTEROL SULFATE (2.5 MG/3ML) 0.083% IN NEBU: 2.5000 mg | INHALATION_SOLUTION | RESPIRATORY_TRACT | 3 refills | Status: DC | PRN

## 2023-09-29 NOTE — Telephone Encounter (Signed)
 Please notify her that I sent the Albuterol  0.83% nebulizer solution to [facility], large quantity she can contact us  if any further issues with picking it up or quantity.  Domingo Friend, DO [facility] 09/29/2023, 12:16 PM

## 2023-09-29 NOTE — Telephone Encounter (Signed)
 Copied from CRM [id number]. Topic: Clinical - Prescription Issue >> Sep 29, 2023  9:11 AM Donald Frost wrote: Reason for CRM: The patient called stating she is running out of her albuterol  (ACCUNEB ) 0.63 MG/3ML nebulizer solution sooner than she should as she says her previous Dr had written her prescription for 0.83 MG and she thought that is what Dr Linnell Richardson was writing it for as well. She takes it twice a day and this is why it is running out so fast. Please assist patient further if a new prescription for the 0.83 can be called into  [facility] - [place], [place] - [address]  Phone: [phone] Fax: [phone]   Please assist patient further as she is running very low. She has enough for today and tomorrow but would like this as soon as possible so she doesn't have to worry about it.

## 2023-09-29 NOTE — Addendum Note (Signed)
 Addended by: Raina Bunting on: 09/29/2023 12:16 PM   Modules accepted: Orders

## 2023-09-29 NOTE — Telephone Encounter (Signed)
 Patient notified prescription sent in with a larger quantity.

## 2023-10-02 DIAGNOSIS — M25519 Pain in unspecified shoulder: Secondary | ICD-10-CM | POA: Diagnosis not present

## 2023-10-02 DIAGNOSIS — M722 Plantar fascial fibromatosis: Secondary | ICD-10-CM | POA: Diagnosis not present

## 2023-10-02 DIAGNOSIS — M25569 Pain in unspecified knee: Secondary | ICD-10-CM | POA: Diagnosis not present

## 2023-10-02 DIAGNOSIS — G894 Chronic pain syndrome: Secondary | ICD-10-CM | POA: Diagnosis not present

## 2023-10-02 DIAGNOSIS — M5412 Radiculopathy, cervical region: Secondary | ICD-10-CM | POA: Diagnosis not present

## 2023-10-02 DIAGNOSIS — M542 Cervicalgia: Secondary | ICD-10-CM | POA: Diagnosis not present

## 2023-10-02 DIAGNOSIS — Z79891 Long term (current) use of opiate analgesic: Secondary | ICD-10-CM | POA: Diagnosis not present

## 2023-10-02 DIAGNOSIS — M545 Low back pain, unspecified: Secondary | ICD-10-CM | POA: Diagnosis not present

## 2023-10-10 ENCOUNTER — Other Ambulatory Visit: Payer: Self-pay | Admitting: Family Medicine

## 2023-10-10 DIAGNOSIS — G894 Chronic pain syndrome: Secondary | ICD-10-CM

## 2023-10-10 DIAGNOSIS — M4722 Other spondylosis with radiculopathy, cervical region: Secondary | ICD-10-CM

## 2023-10-10 DIAGNOSIS — M150 Primary generalized (osteo)arthritis: Secondary | ICD-10-CM

## 2023-10-10 NOTE — Telephone Encounter (Signed)
 Unable to refill per protocol, appointment needed.   Requested Prescriptions  Pending Prescriptions Disp Refills   gabapentin  (NEURONTIN ) 400 MG capsule [Pharmacy Med Name: GABAPENTIN  400 MG CAP] 360 capsule 3    Sig: TAKE 1 CAPSULE BY MOUTH 4 TIMES DAILY     Neurology: Anticonvulsants - gabapentin  Failed - 10/10/2023 12:11 PM      Failed - Valid encounter within last 12 months    Recent Outpatient Visits   None            Passed - Cr in normal range and within 360 days    Creat  Date Value Ref Range Status  04/07/2023 0.77 0.50 - 1.05 mg/dL Final         Passed - Completed PHQ-2 or PHQ-9 in the last 360 days

## 2023-10-15 ENCOUNTER — Other Ambulatory Visit: Payer: Self-pay | Admitting: Family Medicine

## 2023-10-15 ENCOUNTER — Encounter: Payer: Self-pay | Admitting: Family Medicine

## 2023-10-15 ENCOUNTER — Ambulatory Visit (INDEPENDENT_AMBULATORY_CARE_PROVIDER_SITE_OTHER): Admitting: Family Medicine

## 2023-10-15 VITALS — BP 130/76 | HR 78 | Ht 64.5 in | Wt 152.4 lb

## 2023-10-15 DIAGNOSIS — J432 Centrilobular emphysema: Secondary | ICD-10-CM | POA: Diagnosis not present

## 2023-10-15 DIAGNOSIS — M4722 Other spondylosis with radiculopathy, cervical region: Secondary | ICD-10-CM | POA: Diagnosis not present

## 2023-10-15 DIAGNOSIS — G894 Chronic pain syndrome: Secondary | ICD-10-CM

## 2023-10-15 DIAGNOSIS — F411 Generalized anxiety disorder: Secondary | ICD-10-CM

## 2023-10-15 DIAGNOSIS — E039 Hypothyroidism, unspecified: Secondary | ICD-10-CM

## 2023-10-15 DIAGNOSIS — M150 Primary generalized (osteo)arthritis: Secondary | ICD-10-CM

## 2023-10-15 DIAGNOSIS — E782 Mixed hyperlipidemia: Secondary | ICD-10-CM

## 2023-10-15 DIAGNOSIS — Z0000 Encounter for general adult medical examination without abnormal findings: Secondary | ICD-10-CM

## 2023-10-15 DIAGNOSIS — R7309 Other abnormal glucose: Secondary | ICD-10-CM

## 2023-10-15 DIAGNOSIS — I10 Essential (primary) hypertension: Secondary | ICD-10-CM

## 2023-10-15 MED ORDER — GABAPENTIN 400 MG PO CAPS: 400.0000 mg | ORAL_CAPSULE | Freq: Three times a day (TID) | ORAL | 3 refills | Status: AC

## 2023-10-15 NOTE — Patient Instructions (Addendum)
 Thank you for coming to the office today.  Refill Gabapentin  400mg , 3 times per day, 90 day sent with refills  Keep on the Albuterol  solution as needed  DUE for FASTING BLOOD WORK (no food or drink after midnight before the lab appointment, only water or coffee without cream/sugar on the morning of)  SCHEDULE Lab Only visit in the morning at the clinic for lab draw in 6 MONTHS   - Make sure Lab Only appointment is at about 1 week before your next appointment, so that results will be available  For Lab Results, once available within 2-3 days of blood draw, you can can log in to MyChart online to view your results and a brief explanation. Also, we can discuss results at next follow-up visit.   Please schedule a Follow-up Appointment to: Return for 6 month fasting lab > 1 week later Annual Physical.  If you have any other questions or concerns, please feel free to call the office or send a message through MyChart. You may also schedule an earlier appointment if necessary.  Additionally, you may be receiving a survey about your experience at our office within a few days to 1 week by e-mail or mail. We value your feedback.  Domingo Friend, DO [facility], [place]

## 2023-10-15 NOTE — Progress Notes (Signed)
 Subjective:    Patient ID: Audrey Campbell, female    DOB: 03-07-1956, 68 y.o.   MRN: [id number]  Audrey Campbell is a 68 y.o. female presenting on 10/15/2023 for Arthritis   HPI  Discussed the use of AI scribe software for clinical note transcription with the patient, who gave verbal consent to proceed.  History of Present Illness   Audrey Campbell is a 68 year old female who presents for medication management and follow-up.  Osteoarthritis  She is doing well currently with joint pain and chronic pain. She is currently taking gabapentin , adjusted to three times a day instead of four, with two pills at night and one during the day as needed. No issues with sleepiness or grogginess from the medication.  She uses Seroquel , which helps her sleep well.  Her albuterol  solution prescription for the nebulizer was successfully sent to [facility] after previous issues with [facility] not receiving the prescription. The medication is now available.  She had a colonoscopy on January 24, 2021, and was advised to have it every three years.           10/15/2023    9:38 AM 03/07/2023   10:49 AM 09/26/2022   10:45 AM  Depression screen PHQ 2/9  Decreased Interest 0 0 0  Down, Depressed, Hopeless 0 0 0  PHQ - 2 Score 0 0 0  Altered sleeping 0 0 0  Tired, decreased energy 0 0 0  Change in appetite 0 0 0  Feeling bad or failure about yourself  0 0 0  Trouble concentrating 0 0 0  Moving slowly or fidgety/restless 0 0 0  Suicidal thoughts 0 0 0  PHQ-9 Score 0 0 0  Difficult doing work/chores Not difficult at all Not difficult at all        10/15/2023    9:38 AM 09/26/2022   10:45 AM 03/28/2022   10:55 AM 09/21/2021    8:45 AM  GAD 7 : Generalized Anxiety Score  Nervous, Anxious, on Edge 0 0 0 1  Control/stop worrying 0 0 1 1  Worry too much - different things 0 0 1 2  Trouble relaxing 0 0 1 1  Restless 0 0 0 1  Easily annoyed or irritable 0 0 1 1  Afraid - awful might  happen 0 0 1 1  Total GAD 7 Score 0 0 5 8  Anxiety Difficulty Not difficult at all  Not difficult at all Somewhat difficult    Social History   Tobacco Use   Smoking status: Every Day    Current packs/day: 0.75    Average packs/day: 0.8 packs/day for 51.5 years (38.6 ttl pk-yrs)    Types: Cigarettes    Start date: 04/29/1972   Smokeless tobacco: Never  Vaping Use   Vaping status: Never Used  Substance Use Topics   Alcohol use: No   Drug use: Yes    Types: Marijuana    Review of Systems Per HPI unless specifically indicated above     Objective:    BP 130/76 (BP Location: Left Arm, Patient Position: Sitting, Cuff Size: Normal)   Pulse 78   Ht 5' 4.5 (1.638 m)   Wt 152 lb 6.4 oz (69.1 kg)   SpO2 96%   BMI 25.76 kg/m   Wt Readings from Last 3 Encounters:  10/15/23 152 lb 6.4 oz (69.1 kg)  05/21/23 161 lb (73 kg)  03/07/23 162 lb 12.8 oz (73.8 kg)    Physical  Exam Vitals and nursing note reviewed.  Constitutional:      General: She is not in acute distress.    Appearance: Normal appearance. She is well-developed. She is not diaphoretic.     Comments: Well-appearing, comfortable, cooperative  HENT:     Head: Normocephalic and atraumatic.   Eyes:     General:        Right eye: No discharge.        Left eye: No discharge.     Conjunctiva/sclera: Conjunctivae normal.    Cardiovascular:     Rate and Rhythm: Normal rate.  Pulmonary:     Effort: Pulmonary effort is normal.   Skin:    General: Skin is warm and dry.     Findings: No erythema or rash.   Neurological:     Mental Status: She is alert and oriented to person, place, and time.   Psychiatric:        Mood and Affect: Mood normal.        Behavior: Behavior normal.        Thought Content: Thought content normal.     Comments: Well groomed, good eye contact, normal speech and thoughts     Results for orders placed or performed in visit on 06/06/23  ECHOCARDIOGRAM COMPLETE   Collection Time:  06/06/23  3:05 PM  Result Value Ref Range   AV Peak grad 9.7 mmHg   Ao pk vel 1.56 m/s   Area-P 1/2 3.85 cm2   AV Mean grad 5.0 mmHg   Est EF 60 - 65%       Assessment & Plan:   Problem List Items Addressed This Visit     Centrilobular emphysema (HCC) - Primary   Chronic pain syndrome   Relevant Medications   gabapentin  (NEURONTIN ) 400 MG capsule   Osteoarthritis of spine with radiculopathy, cervical region   Relevant Medications   gabapentin  (NEURONTIN ) 400 MG capsule   Primary osteoarthritis involving multiple joints   Relevant Medications   gabapentin  (NEURONTIN ) 400 MG capsule    Chronic Pain / Osteoarthritis Neuropathic Pain Improved control, requesting lower dose now. Reduce Gabapentin  4 times per day down to 3 times per day at 400mg  per dose - Prescribe gabapentin  400 mg, 270 tablets for 90 days with refills.  COPD Albuterol  solution for nebulizer used as needed. Prescription issues resolved. - Continue albuterol  solution for nebulizer as needed.  Colorectal Cancer Screening Colonoscopy performed 01/24/2021. Next due September 2025. Procedure location to be revisited December 2025 - Discuss colonoscopy scheduling in December 2025 due to her prior GI providers changing locations. May need new provider or diff office.  General Health Maintenance Physical exam due in winter. Blood work to precede exam. Scheduling planned for December 2024. - Schedule a physical examination in December 2024. - Perform blood work prior to the physical examination.        No orders of the defined types were placed in this encounter.   Meds ordered this encounter  Medications   gabapentin  (NEURONTIN ) 400 MG capsule    Sig: Take 1 capsule (400 mg total) by mouth 3 (three) times daily.    Dispense:  270 capsule    Refill:  3    Quantity 90 day, reduced from 4 per day down to 3 per day    Follow up plan: Return for 6 month fasting lab > 1 week later Annual Physical.  Future  labs ordered for 04/06/24  Domingo Friend, DO [facility] 10/15/2023, 10:04 AM

## 2023-10-28 ENCOUNTER — Other Ambulatory Visit: Payer: Self-pay | Admitting: Family Medicine

## 2023-10-28 DIAGNOSIS — E039 Hypothyroidism, unspecified: Secondary | ICD-10-CM

## 2023-10-28 DIAGNOSIS — J302 Other seasonal allergic rhinitis: Secondary | ICD-10-CM

## 2023-10-29 ENCOUNTER — Other Ambulatory Visit: Payer: Self-pay | Admitting: Family Medicine

## 2023-10-29 DIAGNOSIS — E039 Hypothyroidism, unspecified: Secondary | ICD-10-CM

## 2023-10-29 DIAGNOSIS — J302 Other seasonal allergic rhinitis: Secondary | ICD-10-CM

## 2023-10-29 NOTE — Telephone Encounter (Signed)
 Requested Prescriptions  Pending Prescriptions Disp Refills   levothyroxine  (SYNTHROID ) 100 MCG tablet [Pharmacy Med Name: LEVOTHYROXINE  SODIUM 100 MCG TAB] 90 tablet 0    Sig: TAKE 1 TABLET BY MOUTH DAILY BEFORE BREAKFAST     Endocrinology:  Hypothyroid Agents Passed - 10/29/2023  5:18 PM      Passed - TSH in normal range and within 360 days    TSH  Date Value Ref Range Status  04/07/2023 1.18 0.40 - 4.50 mIU/L Final         Passed - Valid encounter within last 12 months    Recent Outpatient Visits           2 weeks ago Centrilobular emphysema [facility])   [place] [facility] [place], Marsa PARAS, DO               montelukast  (SINGULAIR ) 10 MG tablet [Pharmacy Med Name: MONTELUKAST  SODIUM 10 MG TAB] 90 tablet 0    Sig: TAKE 1 TABLET BY MOUTH DAILY     Pulmonology:  Leukotriene Inhibitors Passed - 10/29/2023  5:18 PM      Passed - Valid encounter within last 12 months    Recent Outpatient Visits           2 weeks ago Centrilobular emphysema [facility])   [place] [facility] [place], Marsa PARAS, [place]

## 2023-10-29 NOTE — Telephone Encounter (Signed)
 Copied from CRM [id number]. Topic: Clinical - Medication Refill >> Oct 29, 2023 12:34 PM Kevelyn M wrote: Medication: levothyroxine  (SYNTHROID ) 100 MCG tablet and montelukast  (SINGULAIR ) 10 MG tablet  Has the patient contacted their pharmacy? Yes (Agent: If no, request that the patient contact the pharmacy for the refill. If patient does not wish to contact the pharmacy document the reason why and proceed with request.) (Agent: If yes, when and what did the pharmacy advise?)  This is the patient's preferred pharmacy:  [facility] - [place], [place] - [address] [place] [address] Phone: [phone] Fax: [phone]  Is this the correct pharmacy for this prescription? Yes If no, delete pharmacy and type the correct one.   Has the prescription been filled recently? No  Is the patient out of the medication? Yes, out of montelukast  (SINGULAIR ) 10 MG tablet  Has the patient been seen for an appointment in the last year OR does the patient have an upcoming appointment? Yes  Can we respond through MyChart? No  Agent: Please be advised that Rx refills may take up to 3 business days. We ask that you follow-up with your pharmacy.

## 2023-10-31 NOTE — Telephone Encounter (Signed)
 Requested Prescriptions  Refused Prescriptions Disp Refills   levothyroxine  (SYNTHROID ) 100 MCG tablet 90 tablet 3    Sig: Take 1 tablet (100 mcg total) by mouth daily before breakfast.     Endocrinology:  Hypothyroid Agents Passed - 10/31/2023  4:42 PM      Passed - TSH in normal range and within 360 days    TSH  Date Value Ref Range Status  04/07/2023 1.18 0.40 - 4.50 mIU/L Final         Passed - Valid encounter within last 12 months    Recent Outpatient Visits           2 weeks ago Centrilobular emphysema [facility])   [place] [facility] [place], Marsa PARAS, DO               montelukast  (SINGULAIR ) 10 MG tablet 90 tablet 1    Sig: Take 1 tablet (10 mg total) by mouth daily.     Pulmonology:  Leukotriene Inhibitors Passed - 10/31/2023  4:42 PM      Passed - Valid encounter within last 12 months    Recent Outpatient Visits           2 weeks ago Centrilobular emphysema [facility])   [place] [facility] [place], Marsa PARAS, [place]

## 2023-11-13 ENCOUNTER — Other Ambulatory Visit: Payer: Self-pay | Admitting: Family Medicine

## 2023-11-13 DIAGNOSIS — M25521 Pain in right elbow: Secondary | ICD-10-CM | POA: Diagnosis not present

## 2023-11-13 DIAGNOSIS — G8929 Other chronic pain: Secondary | ICD-10-CM | POA: Diagnosis not present

## 2023-11-13 DIAGNOSIS — M9931 Osseous stenosis of neural canal of cervical region: Secondary | ICD-10-CM | POA: Diagnosis not present

## 2023-11-13 DIAGNOSIS — M7711 Lateral epicondylitis, right elbow: Secondary | ICD-10-CM | POA: Diagnosis not present

## 2023-11-13 DIAGNOSIS — F5104 Psychophysiologic insomnia: Secondary | ICD-10-CM

## 2023-11-14 ENCOUNTER — Other Ambulatory Visit: Payer: Self-pay | Admitting: Family Medicine

## 2023-11-14 DIAGNOSIS — F5104 Psychophysiologic insomnia: Secondary | ICD-10-CM

## 2023-11-14 NOTE — Telephone Encounter (Signed)
 Requested medication (s) are due for refill today: yes  Requested medication (s) are on the active medication list: yes  Last refill:  05/29/23 #180/1  Future visit scheduled: yes  Notes to clinic:  Unable to refill per protocol, cannot delegate.      Requested Prescriptions  Pending Prescriptions Disp Refills   QUEtiapine  (SEROQUEL ) 100 MG tablet [Pharmacy Med Name: QUETIAPINE  FUMARATE 100 MG TAB] 180 tablet 1    Sig: TAKE 2 TABLETS BY MOUTH AT BEDTIME     Not Delegated - Psychiatry:  Antipsychotics - Second Generation (Atypical) - quetiapine  Failed - 11/14/2023  1:13 PM      Failed - This refill cannot be delegated      Failed - Lipid Panel in normal range within the last 12 months    Cholesterol  Date Value Ref Range Status  04/07/2023 183 <200 mg/dL Final   LDL Cholesterol (Calc)  Date Value Ref Range Status  04/07/2023 112 (H) mg/dL (calc) Final    Comment:    Reference range: <100 . Desirable range <100 mg/dL for primary prevention;   <70 mg/dL for patients with CHD or diabetic patients  with > or = 2 CHD risk factors. SABRA LDL-C is now calculated using the Martin-Hopkins  calculation, which is a validated novel method providing  better accuracy than the Friedewald equation in the  estimation of LDL-C.  Gladis APPLETHWAITE et al. SANDREA. 7986;689(80): 2061-2068  (http://education.[facility].com/faq/FAQ164)    HDL  Date Value Ref Range Status  04/07/2023 55 > OR = 50 mg/dL Final   Triglycerides  Date Value Ref Range Status  04/07/2023 73 <150 mg/dL Final         Failed - CMP within normal limits and completed in the last 12 months    No results found for: ALBUMIN, ALBMS, POCALB, ALBUMINELP Alkaline phosphatase (APISO)  Date Value Ref Range Status  04/07/2023 89 37 - 153 U/L Final   ALT  Date Value Ref Range Status  04/07/2023 9 6 - 29 U/L Final   AST  Date Value Ref Range Status  04/07/2023 16 10 - 35 U/L Final   BUN  Date Value Ref Range Status   04/07/2023 11 7 - 25 mg/dL Final   Calcium   Date Value Ref Range Status  04/07/2023 9.4 8.6 - 10.4 mg/dL Final   CO2  Date Value Ref Range Status  04/07/2023 28 20 - 32 mmol/L Final   Bicarbonate  Date Value Ref Range Status  12/10/2017 29.6 (H) 20.0 - 28.0 mmol/L Final   Creat  Date Value Ref Range Status  04/07/2023 0.77 0.50 - 1.05 mg/dL Final   Glucose, Bld  Date Value Ref Range Status  04/07/2023 107 (H) 65 - 99 mg/dL Final    Comment:    .            Fasting reference interval . For someone without known diabetes, a glucose value between 100 and 125 mg/dL is consistent with prediabetes and should be confirmed with a follow-up test. .    Potassium  Date Value Ref Range Status  04/07/2023 4.1 3.5 - 5.3 mmol/L Final   Sodium  Date Value Ref Range Status  04/07/2023 136 135 - 146 mmol/L Final   Total Bilirubin  Date Value Ref Range Status  04/07/2023 0.3 0.2 - 1.2 mg/dL Final   Total Protein  Date Value Ref Range Status  04/07/2023 6.9 6.1 - 8.1 g/dL Final   GFR, Est African American  Date Value Ref  Range Status  10/16/2020 75 > OR = 60 mL/min/1.69m2 Final   eGFR  Date Value Ref Range Status  04/07/2023 84 > OR = 60 mL/min/1.95m2 Final   GFR, Est Non African American  Date Value Ref Range Status  10/16/2020 64 > OR = 60 mL/min/1.42m2 Final         Passed - TSH in normal range and within 360 days    TSH  Date Value Ref Range Status  04/07/2023 1.18 0.40 - 4.50 mIU/L Final         Passed - Completed PHQ-2 or PHQ-9 in the last 360 days      Passed - Last BP in normal range    BP Readings from Last 1 Encounters:  10/15/23 130/76         Passed - Last Heart Rate in normal range    Pulse Readings from Last 1 Encounters:  10/15/23 78         Passed - Valid encounter within last 6 months    Recent Outpatient Visits           1 month ago Centrilobular emphysema (HCC)   [place] [facility] [place], Marsa PARAS, DO               Passed - CBC within normal limits and completed in the last 12 months    WBC  Date Value Ref Range Status  04/07/2023 8.7 3.8 - 10.8 Thousand/uL Final   RBC  Date Value Ref Range Status  04/07/2023 4.43 3.80 - 5.10 Million/uL Final   Hemoglobin  Date Value Ref Range Status  04/07/2023 11.8 11.7 - 15.5 g/dL Final   HCT  Date Value Ref Range Status  04/07/2023 37.3 35.0 - 45.0 % Final   MCHC  Date Value Ref Range Status  04/07/2023 31.6 (L) 32.0 - 36.0 g/dL Final    Comment:    For adults, a slight decrease in the calculated MCHC value (in the range of 30 to 32 g/dL) is most likely not clinically significant; however, it should be interpreted with caution in correlation with other red cell parameters and the patient's clinical condition.    [facility]  Date Value Ref Range Status  04/07/2023 26.6 (L) 27.0 - 33.0 pg Final   MCV  Date Value Ref Range Status  04/07/2023 84.2 80.0 - 100.0 fL Final   No results found for: PLTCOUNTKUC, LABPLAT, POCPLA RDW  Date Value Ref Range Status  04/07/2023 14.6 11.0 - 15.0 % Final

## 2023-11-14 NOTE — Telephone Encounter (Signed)
 Copied from CRM [id number]. Topic: Clinical - Medication Refill >> Nov 14, 2023  9:52 AM Carlatta H wrote: Medication:   QUEtiapine  (SEROQUEL ) 100 MG tablet  Has the patient contacted their pharmacy? No (Agent: If no, request that the patient contact the pharmacy for the refill. If patient does not wish to contact the pharmacy document the reason why and proceed with request.) (Agent: If yes, when and what did the pharmacy advise?)  This is the patient's preferred pharmacy:  [facility] - [place], [place] - [address] [place] [address] Phone: [phone] Fax: [phone]   Is this the correct pharmacy for this prescription?  If no, delete pharmacy and type the correct one. Yes  Has the prescription been filled recently? No  Is the patient out of the medication? Yes  Has the patient been seen for an appointment in the last year OR does the patient have an upcoming appointment? Yes  Can we respond through MyChart? No  Agent: Please be advised that Rx refills may take up to 3 business days. We ask that you follow-up with your pharmacy.

## 2023-11-17 NOTE — Telephone Encounter (Signed)
 Duplicate request. Refilled 11/14/23.  Requested Prescriptions  Pending Prescriptions Disp Refills   QUEtiapine  (SEROQUEL ) 100 MG tablet 180 tablet 1    Sig: Take 2 tablets (200 mg total) by mouth at bedtime.     Not Delegated - Psychiatry:  Antipsychotics - Second Generation (Atypical) - quetiapine  Failed - 11/17/2023  9:13 AM      Failed - This refill cannot be delegated      Failed - Lipid Panel in normal range within the last 12 months    Cholesterol  Date Value Ref Range Status  04/07/2023 183 <200 mg/dL Final   LDL Cholesterol (Calc)  Date Value Ref Range Status  04/07/2023 112 (H) mg/dL (calc) Final    Comment:    Reference range: <100 . Desirable range <100 mg/dL for primary prevention;   <70 mg/dL for patients with CHD or diabetic patients  with > or = 2 CHD risk factors. SABRA LDL-C is now calculated using the Martin-Hopkins  calculation, which is a validated novel method providing  better accuracy than the Friedewald equation in the  estimation of LDL-C.  Gladis APPLETHWAITE et al. SANDREA. 7986;689(80): 2061-2068  (http://education.[facility].com/faq/FAQ164)    HDL  Date Value Ref Range Status  04/07/2023 55 > OR = 50 mg/dL Final   Triglycerides  Date Value Ref Range Status  04/07/2023 73 <150 mg/dL Final         Failed - CMP within normal limits and completed in the last 12 months    No results found for: ALBUMIN, ALBMS, POCALB, ALBUMINELP Alkaline phosphatase (APISO)  Date Value Ref Range Status  04/07/2023 89 37 - 153 U/L Final   ALT  Date Value Ref Range Status  04/07/2023 9 6 - 29 U/L Final   AST  Date Value Ref Range Status  04/07/2023 16 10 - 35 U/L Final   BUN  Date Value Ref Range Status  04/07/2023 11 7 - 25 mg/dL Final   Calcium   Date Value Ref Range Status  04/07/2023 9.4 8.6 - 10.4 mg/dL Final   CO2  Date Value Ref Range Status  04/07/2023 28 20 - 32 mmol/L Final   Bicarbonate  Date Value Ref Range Status  12/10/2017 29.6 (H)  20.0 - 28.0 mmol/L Final   Creat  Date Value Ref Range Status  04/07/2023 0.77 0.50 - 1.05 mg/dL Final   Glucose, Bld  Date Value Ref Range Status  04/07/2023 107 (H) 65 - 99 mg/dL Final    Comment:    .            Fasting reference interval . For someone without known diabetes, a glucose value between 100 and 125 mg/dL is consistent with prediabetes and should be confirmed with a follow-up test. .    Potassium  Date Value Ref Range Status  04/07/2023 4.1 3.5 - 5.3 mmol/L Final   Sodium  Date Value Ref Range Status  04/07/2023 136 135 - 146 mmol/L Final   Total Bilirubin  Date Value Ref Range Status  04/07/2023 0.3 0.2 - 1.2 mg/dL Final   Total Protein  Date Value Ref Range Status  04/07/2023 6.9 6.1 - 8.1 g/dL Final   GFR, Est African American  Date Value Ref Range Status  10/16/2020 75 > OR = 60 mL/min/1.25m2 Final   eGFR  Date Value Ref Range Status  04/07/2023 84 > OR = 60 mL/min/1.39m2 Final   GFR, Est Non African American  Date Value Ref Range Status  10/16/2020 64 > OR =  60 mL/min/1.66m2 Final         Passed - TSH in normal range and within 360 days    TSH  Date Value Ref Range Status  04/07/2023 1.18 0.40 - 4.50 mIU/L Final         Passed - Completed PHQ-2 or PHQ-9 in the last 360 days      Passed - Last BP in normal range    BP Readings from Last 1 Encounters:  10/15/23 130/76         Passed - Last Heart Rate in normal range    Pulse Readings from Last 1 Encounters:  10/15/23 78         Passed - Valid encounter within last 6 months    Recent Outpatient Visits           1 month ago Centrilobular emphysema (HCC)   [place] [facility] [place], Marsa PARAS, DO              Passed - CBC within normal limits and completed in the last 12 months    WBC  Date Value Ref Range Status  04/07/2023 8.7 3.8 - 10.8 Thousand/uL Final   RBC  Date Value Ref Range Status  04/07/2023 4.43 3.80 - 5.10 Million/uL Final    Hemoglobin  Date Value Ref Range Status  04/07/2023 11.8 11.7 - 15.5 g/dL Final   HCT  Date Value Ref Range Status  04/07/2023 37.3 35.0 - 45.0 % Final   MCHC  Date Value Ref Range Status  04/07/2023 31.6 (L) 32.0 - 36.0 g/dL Final    Comment:    For adults, a slight decrease in the calculated MCHC value (in the range of 30 to 32 g/dL) is most likely not clinically significant; however, it should be interpreted with caution in correlation with other red cell parameters and the patient's clinical condition.    [facility]  Date Value Ref Range Status  04/07/2023 26.6 (L) 27.0 - 33.0 pg Final   MCV  Date Value Ref Range Status  04/07/2023 84.2 80.0 - 100.0 fL Final   No results found for: PLTCOUNTKUC, LABPLAT, POCPLA RDW  Date Value Ref Range Status  04/07/2023 14.6 11.0 - 15.0 % Final

## 2023-12-04 DIAGNOSIS — G894 Chronic pain syndrome: Secondary | ICD-10-CM | POA: Diagnosis not present

## 2023-12-04 DIAGNOSIS — M722 Plantar fascial fibromatosis: Secondary | ICD-10-CM | POA: Diagnosis not present

## 2023-12-04 DIAGNOSIS — Z79899 Other long term (current) drug therapy: Secondary | ICD-10-CM | POA: Diagnosis not present

## 2023-12-04 DIAGNOSIS — M5412 Radiculopathy, cervical region: Secondary | ICD-10-CM | POA: Diagnosis not present

## 2023-12-04 DIAGNOSIS — M542 Cervicalgia: Secondary | ICD-10-CM | POA: Diagnosis not present

## 2023-12-04 DIAGNOSIS — Z79891 Long term (current) use of opiate analgesic: Secondary | ICD-10-CM | POA: Diagnosis not present

## 2023-12-04 DIAGNOSIS — M25569 Pain in unspecified knee: Secondary | ICD-10-CM | POA: Diagnosis not present

## 2023-12-04 DIAGNOSIS — M545 Low back pain, unspecified: Secondary | ICD-10-CM | POA: Diagnosis not present

## 2023-12-04 DIAGNOSIS — M25519 Pain in unspecified shoulder: Secondary | ICD-10-CM | POA: Diagnosis not present

## 2023-12-26 ENCOUNTER — Ambulatory Visit (INDEPENDENT_AMBULATORY_CARE_PROVIDER_SITE_OTHER): Admitting: Family Medicine

## 2023-12-26 ENCOUNTER — Encounter: Payer: Self-pay | Admitting: Family Medicine

## 2023-12-26 VITALS — BP 110/64 | HR 81 | Ht 64.5 in | Wt 151.0 lb

## 2023-12-26 DIAGNOSIS — F17210 Nicotine dependence, cigarettes, uncomplicated: Secondary | ICD-10-CM | POA: Diagnosis not present

## 2023-12-26 DIAGNOSIS — R634 Abnormal weight loss: Secondary | ICD-10-CM | POA: Diagnosis not present

## 2023-12-26 DIAGNOSIS — R630 Anorexia: Secondary | ICD-10-CM | POA: Diagnosis not present

## 2023-12-26 NOTE — Patient Instructions (Addendum)
 Thank you for coming to the office today.  Goal to improve dietary intake and protein  We can consider switching Seroquel  and switch over to Mirtazapine Remeron  Call back in 2 weeks if not heard on lung scan results yet.  Please schedule a Follow-up Appointment to: Return if symptoms worsen or fail to improve.  If you have any other questions or concerns, please feel free to call the office or send a message through MyChart. You may also schedule an earlier appointment if necessary.  Additionally, you may be receiving a survey about your experience at our office within a few days to 1 week by e-mail or mail. We value your feedback.  Marsa Officer, DO [facility], [place]

## 2023-12-26 NOTE — Progress Notes (Signed)
 Subjective:    Patient ID: Audrey Campbell, female    DOB: 1955/07/07, 68 y.o.   MRN: [id number]  Audrey Campbell is a 68 y.o. female presenting on 12/26/2023 for Weight Loss   HPI  Discussed the use of AI scribe software for clinical note transcription with the patient, who gave verbal consent to proceed.  History of Present Illness   Audrey Campbell is a 68 year old female with COPD who presents with unintentional weight loss.  Unintentional weight loss and reduced appetite - Weight decreased from 167 pounds to 151 pounds over the past year, with the most significant loss since the start of the year - Lack of appetite; describes food as 'not good' - Diet consists of one substantial meal at lunch and snacking throughout the day, with a preference for fried chicken and peanut butter - Consumes Ensure to supplement diet and drinks milk before bed - Despite dietary efforts, continues to lose weight  Lack of Gastrointestinal symptoms - No nausea, vomiting, diarrhea, or blood in the stool - No abdominal pain - Takes Miralax for bowel movements  Emphysema / Chronic obstructive pulmonary disease (copd) symptoms - History of COPD - On Maintenance therapy with improvement. - Difficulty chewing food due to fatigue and perceived lack of oxygen ; gets 'tired of chewing' - Scheduled for yearly lung scan next week  Insomnia - Sleep is good with the use of Seroquel        12/26/2023   10:46 AM 10/15/2023    9:38 AM 03/07/2023   10:49 AM  Depression screen PHQ 2/9  Decreased Interest 0 0 0  Down, Depressed, Hopeless 0 0 0  PHQ - 2 Score 0 0 0  Altered sleeping 0 0 0  Tired, decreased energy 0 0 0  Change in appetite 2 0 0  Feeling bad or failure about yourself  0 0 0  Trouble concentrating 0 0 0  Moving slowly or fidgety/restless 0 0 0  Suicidal thoughts 0 0 0  PHQ-9 Score 2 0 0  Difficult doing work/chores Not difficult at all Not difficult at all Not difficult at all        12/26/2023   10:46 AM 10/15/2023    9:38 AM 09/26/2022   10:45 AM 03/28/2022   10:55 AM  GAD 7 : Generalized Anxiety Score  Nervous, Anxious, on Edge 0 0 0 0  Control/stop worrying 0 0 0 1  Worry too much - different things 0 0 0 1  Trouble relaxing 0 0 0 1  Restless 0 0 0 0  Easily annoyed or irritable 0 0 0 1  Afraid - awful might happen 0 0 0 1  Total GAD 7 Score 0 0 0 5  Anxiety Difficulty  Not difficult at all  Not difficult at all    Social History   Tobacco Use   Smoking status: Every Day    Current packs/day: 0.75    Average packs/day: 0.8 packs/day for 51.7 years (38.7 ttl pk-yrs)    Types: Cigarettes    Start date: 04/29/1972   Smokeless tobacco: Never  Vaping Use   Vaping status: Never Used  Substance Use Topics   Alcohol use: No   Drug use: Yes    Types: Marijuana    Review of Systems Per HPI unless specifically indicated above     Objective:    BP 110/64 (BP Location: Left Arm, Patient Position: Sitting, Cuff Size: Normal)   Pulse 81   Ht  5' 4.5 (1.638 m)   Wt 151 lb (68.5 kg)   SpO2 93%   BMI 25.52 kg/m   Wt Readings from Last 3 Encounters:  12/26/23 151 lb (68.5 kg)  10/15/23 152 lb 6.4 oz (69.1 kg)  05/21/23 161 lb (73 kg)    Physical Exam Vitals and nursing note reviewed.  Constitutional:      General: She is not in acute distress.    Appearance: Normal appearance. She is well-developed. She is not diaphoretic.     Comments: Well-appearing, comfortable, cooperative  HENT:     Head: Normocephalic and atraumatic.  Eyes:     General:        Right eye: No discharge.        Left eye: No discharge.     Conjunctiva/sclera: Conjunctivae normal.  Cardiovascular:     Rate and Rhythm: Normal rate.  Pulmonary:     Effort: Pulmonary effort is normal.  Skin:    General: Skin is warm and dry.     Findings: No erythema or rash.  Neurological:     Mental Status: She is alert and oriented to person, place, and time.  Psychiatric:         Mood and Affect: Mood normal.        Behavior: Behavior normal.        Thought Content: Thought content normal.     Comments: Well groomed, good eye contact, normal speech and thoughts     Results for orders placed or performed in visit on 06/06/23  ECHOCARDIOGRAM COMPLETE   Collection Time: 06/06/23  3:05 PM  Result Value Ref Range   AV Peak grad 9.7 mmHg   Ao pk vel 1.56 m/s   Area-P 1/2 3.85 cm2   AV Mean grad 5.0 mmHg   Est EF 60 - 65%       Assessment & Plan:   Problem List Items Addressed This Visit   None Visit Diagnoses       Unintentional weight loss    -  Primary     Cigarette nicotine  dependence without complication         Appetite loss             Unintentional weight loss and decreased appetite Weight loss from 167 lbs to 151 lbs over the past year, likely due to decreased caloric intake and COPD-related fatigue and active smoker No alarming symptoms or immediate concern for malignancy. She continues with LDCT Screening yearly, has one scheduled for next week No GI alarm symptoms for appetite loss. No Red flag symptoms warranting further imaging. Source of reduced nutrition is linked to her reduced intake overall 1 x meal per day with Ensures and snacks  - Encourage increased caloric and protein intake, focusing on high-protein foods such as chicken, dairy, and peanut butter. - Provide a list of high-protein, high-calorie foods. - Recommend regular consumption of Ensure or similar nutritional supplements. - Offer referral to a nutritionist through the [employer] app for dietary guidance if she is interested, but she declines to use the telehealth platform at this time. - Monitor weight and dietary intake; reassess if weight loss continues. - Consider adjust Seroquel  and switch to or add Mirtazapine for insomnia / appetite in future  Emphysema / Chronic obstructive pulmonary disease (COPD) COPD contributes to fatigue during meals, affecting nutritional  intake. On maintenance therapy Smoking cessation LDCT screening  Nicotine  dependence Continued smoking affects weight and skin health. Discussed smoking's impact on skin thinning and  overall health. - Discuss the benefits of smoking cessation, including potential weight stabilization and improved skin health.  Age-related thinning of skin Skin thinning due to age and smoking, increasing bruising and irritation risk. - Discuss the impact of smoking on skin health and encourage cessation.        No orders of the defined types were placed in this encounter.   No orders of the defined types were placed in this encounter.   Follow up plan: Return if symptoms worsen or fail to improve.    Marsa Officer, DO [facility] 12/26/2023, 10:52 AM

## 2023-12-30 ENCOUNTER — Ambulatory Visit
Admission: RE | Admit: 2023-12-30 | Discharge: 2023-12-30 | Disposition: A | Discharge status: Home / Self Care | Source: Ambulatory Visit | Attending: Acute Care | Admitting: Acute Care

## 2023-12-30 ENCOUNTER — Ambulatory Visit: Admission: RE | Admit: 2023-12-30 | Source: Ambulatory Visit

## 2023-12-30 DIAGNOSIS — F17210 Nicotine dependence, cigarettes, uncomplicated: Secondary | ICD-10-CM | POA: Diagnosis not present

## 2023-12-30 DIAGNOSIS — Z122 Encounter for screening for malignant neoplasm of respiratory organs: Secondary | ICD-10-CM | POA: Insufficient documentation

## 2023-12-30 DIAGNOSIS — Z87891 Personal history of nicotine dependence: Secondary | ICD-10-CM | POA: Insufficient documentation

## 2024-01-12 ENCOUNTER — Telehealth: Payer: Self-pay

## 2024-01-12 ENCOUNTER — Other Ambulatory Visit: Payer: Self-pay

## 2024-01-12 ENCOUNTER — Telehealth: Payer: Self-pay | Admitting: Acute Care

## 2024-01-12 DIAGNOSIS — Z122 Encounter for screening for malignant neoplasm of respiratory organs: Secondary | ICD-10-CM

## 2024-01-12 DIAGNOSIS — F17210 Nicotine dependence, cigarettes, uncomplicated: Secondary | ICD-10-CM

## 2024-01-12 DIAGNOSIS — Z87891 Personal history of nicotine dependence: Secondary | ICD-10-CM

## 2024-01-12 NOTE — Telephone Encounter (Signed)
 Error

## 2024-01-12 NOTE — Telephone Encounter (Signed)
 Call report received:  IMPRESSION: Lung-RADS 3, probably benign findings. Short-term follow-up in 6 months is recommended with repeat low-dose chest CT without contrast (please use the following order, CT CHEST LCS NODULE FOLLOW-UP W/O CM). New clustered indistinct posterior right middle lobe nodules up to 4.7 mm, favor inflammatory.   Aortic Atherosclerosis (ICD10-I70.0) and Emphysema (ICD10-J43.9).   These results will be called to the ordering clinician or representative by the Radiologist Assistant, and communication documented in the PACS or [employer].     Electronically Signed   By: Selinda DELENA Blue M.D.   On: 01/10/2024 21:55

## 2024-01-12 NOTE — Telephone Encounter (Signed)
 Called and spoke with patient. Reviewed recent Lung CT results. She is in agreement to complete a 6 month follow up scan in March to evaluate the new 4.7 mm nodule. She requested a call back closer to the due date. Order placed. Results and plan to PCP.

## 2024-01-28 ENCOUNTER — Other Ambulatory Visit: Payer: Self-pay | Admitting: Family Medicine

## 2024-01-28 DIAGNOSIS — R519 Headache, unspecified: Secondary | ICD-10-CM

## 2024-01-29 ENCOUNTER — Other Ambulatory Visit: Payer: Self-pay | Admitting: Family Medicine

## 2024-01-29 DIAGNOSIS — J302 Other seasonal allergic rhinitis: Secondary | ICD-10-CM

## 2024-01-29 DIAGNOSIS — Z79899 Other long term (current) drug therapy: Secondary | ICD-10-CM | POA: Diagnosis not present

## 2024-01-29 DIAGNOSIS — Z79891 Long term (current) use of opiate analgesic: Secondary | ICD-10-CM | POA: Diagnosis not present

## 2024-01-29 NOTE — Telephone Encounter (Signed)
 Requested medication (s) are due for refill today - yes  Requested medication (s) are on the active medication list -yes  Future visit scheduled -yes  Last refill: 03/24/23 #30 3RF  Notes to clinic: non delegated Rx  Requested Prescriptions  Pending Prescriptions Disp Refills   butalbital -acetaminophen -caffeine  (FIORICET) 50-325-40 MG tablet [Pharmacy Med Name: BUTALBITAL -APAP-CAFFEINE  50-325-40] 30 tablet     Sig: TAKE 1 TABLET BY MOUTH EVERY 6 HOURS AS NEEDED FOR HEADACHE     Not Delegated - Analgesics:  Non-Opioid Analgesic Combinations 2 Failed - 01/29/2024  4:16 PM      Failed - This refill cannot be delegated      Passed - Cr in normal range and within 360 days    Creat  Date Value Ref Range Status  04/07/2023 0.77 0.50 - 1.05 mg/dL Final         Passed - eGFR is 10 or above and within 360 days    GFR, Est African American  Date Value Ref Range Status  10/16/2020 75 > OR = 60 mL/min/1.78m2 Final   GFR, Est Non African American  Date Value Ref Range Status  10/16/2020 64 > OR = 60 mL/min/1.43m2 Final   eGFR  Date Value Ref Range Status  04/07/2023 84 > OR = 60 mL/min/1.22m2 Final         Passed - Patient is not pregnant      Passed - Valid encounter within last 12 months    Recent Outpatient Visits           1 month ago Unintentional weight loss   [facility] [place] Marsa PARAS, DO   3 months ago Centrilobular emphysema [facility])   [facility] [place] Marsa PARAS, DO                 Requested Prescriptions  Pending Prescriptions Disp Refills   butalbital -acetaminophen -caffeine  (FIORICET) 50-325-40 MG tablet [Pharmacy Med Name: BUTALBITAL -APAP-CAFFEINE  50-325-40] 30 tablet     Sig: TAKE 1 TABLET BY MOUTH EVERY 6 HOURS AS NEEDED FOR HEADACHE     Not Delegated - Analgesics:  Non-Opioid Analgesic Combinations 2 Failed - 01/29/2024  4:16 PM      Failed - This refill cannot be delegated       Passed - Cr in normal range and within 360 days    Creat  Date Value Ref Range Status  04/07/2023 0.77 0.50 - 1.05 mg/dL Final         Passed - eGFR is 10 or above and within 360 days    GFR, Est African American  Date Value Ref Range Status  10/16/2020 75 > OR = 60 mL/min/1.28m2 Final   GFR, Est Non African American  Date Value Ref Range Status  10/16/2020 64 > OR = 60 mL/min/1.71m2 Final   eGFR  Date Value Ref Range Status  04/07/2023 84 > OR = 60 mL/min/1.47m2 Final         Passed - Patient is not pregnant      Passed - Valid encounter within last 12 months    Recent Outpatient Visits           1 month ago Unintentional weight loss   [facility] Marsa PARAS, DO   3 months ago Centrilobular emphysema [facility])   [place] [facility] [place], Marsa PARAS, [place]

## 2024-01-30 NOTE — Telephone Encounter (Signed)
 Requested Prescriptions  Pending Prescriptions Disp Refills   montelukast  (SINGULAIR ) 10 MG tablet [Pharmacy Med Name: MONTELUKAST  SODIUM 10 MG TAB] 90 tablet 1    Sig: TAKE 1 TABLET BY MOUTH DAILY     Pulmonology:  Leukotriene Inhibitors Passed - 01/30/2024  2:59 PM      Passed - Valid encounter within last 12 months    Recent Outpatient Visits           1 month ago Unintentional weight loss   [facility] [place] J, DO   3 months ago Centrilobular emphysema [facility])   [facility] [place], Marsa PARAS, [place]

## 2024-02-03 ENCOUNTER — Other Ambulatory Visit: Payer: Self-pay | Admitting: Family Medicine

## 2024-02-03 DIAGNOSIS — R519 Headache, unspecified: Secondary | ICD-10-CM

## 2024-02-04 ENCOUNTER — Telehealth: Payer: Self-pay

## 2024-02-04 DIAGNOSIS — G8929 Other chronic pain: Secondary | ICD-10-CM

## 2024-02-04 MED ORDER — BUTALBITAL-APAP-CAFFEINE 50-325-40 MG PO TABS: ORAL_TABLET | ORAL | 0 refills | Status: AC

## 2024-02-04 NOTE — Telephone Encounter (Signed)
 [facility]

## 2024-02-04 NOTE — Addendum Note (Signed)
 Addended by: EDMAN MARSA PARAS on: 02/04/2024 11:02 AM   Modules accepted: Orders

## 2024-02-04 NOTE — Telephone Encounter (Signed)
 Copied from CRM #[id number]. Topic: Clinical - Prescription Issue >> Feb 04, 2024 10:21 AM Selinda RAMAN wrote: Reason for CRM: Thea patient called in stating [facility] never received the refill for her butalbital -acetaminophen -caffeine  (FIORICET) 50-325-40 MG tablet. It says the date of October 2nd in Epic but does not shows received or receipt so please send this again as she needs this medicine for bad headaches. Please assist patient further.

## 2024-02-04 NOTE — Telephone Encounter (Unsigned)
 Copied from CRM #[id number]. Topic: Clinical - Prescription Issue >> Feb 04, 2024 10:50 AM Donee H wrote: Reason for CRM: Patient called regarding sbutalbital-acetaminophen -caffeine  (FIORICET) 50-325-40 MG tablet. She states pharmacy will need a paper prescription or provider have to call in for a verbal order. Patient states pharmacy will no longer accept prescription through e script.

## 2024-02-04 NOTE — Telephone Encounter (Addendum)
 Rx printed, signed, ready for fax to [facility]   F: [phone]

## 2024-02-10 ENCOUNTER — Other Ambulatory Visit: Payer: Self-pay | Admitting: Family Medicine

## 2024-02-10 DIAGNOSIS — E039 Hypothyroidism, unspecified: Secondary | ICD-10-CM

## 2024-02-12 NOTE — Telephone Encounter (Signed)
 Requested Prescriptions  Pending Prescriptions Disp Refills   levothyroxine  (SYNTHROID ) 100 MCG tablet [Pharmacy Med Name: LEVOTHYROXINE  SODIUM 100 MCG TAB] 90 tablet 0    Sig: TAKE 1 TABLET BY MOUTH DAILY BEFORE BREAKFAST     Endocrinology:  Hypothyroid Agents Passed - 02/12/2024 12:30 PM      Passed - TSH in normal range and within 360 days    TSH  Date Value Ref Range Status  04/07/2023 1.18 0.40 - 4.50 mIU/L Final         Passed - Valid encounter within last 12 months    Recent Outpatient Visits           1 month ago Unintentional weight loss   [facility] [place] J, DO   4 months ago Centrilobular emphysema [facility])   [place] [facility] [place], Marsa PARAS, [place]

## 2024-02-23 ENCOUNTER — Other Ambulatory Visit: Payer: Self-pay | Admitting: Family Medicine

## 2024-02-23 DIAGNOSIS — J432 Centrilobular emphysema: Secondary | ICD-10-CM

## 2024-02-23 DIAGNOSIS — Z1231 Encounter for screening mammogram for malignant neoplasm of breast: Secondary | ICD-10-CM

## 2024-02-23 NOTE — Telephone Encounter (Unsigned)
 Copied from CRM [id number]. Topic: Clinical - Medication Refill >> Feb 23, 2024 10:29 AM Darshell M wrote: Medication:  albuterol  (VENTOLIN  HFA) 108 (90 Base) MCG/ACT inhaler   Has the patient contacted their pharmacy? Yes (Agent: If no, request that the patient contact the pharmacy for the refill. If patient does not wish to contact the pharmacy document the reason why and proceed with request.) (Agent: If yes, when and what did the pharmacy advise?)  This is the patient's preferred pharmacy:  [facility] - [place], [place] - [address] [place] [address] [address] Phone: [phone] Fax: [phone]   Is this the correct pharmacy for this prescription? Yes If no, delete pharmacy and type the correct one.   Has the prescription been filled recently? No  Is the patient out of the medication? Yes  Has the patient been seen for an appointment in the last year OR does the patient have an upcoming appointment? Yes  Can we respond through MyChart? No  Agent: Please be advised that Rx refills may take up to 3 business days. We ask that you follow-up with your pharmacy.

## 2024-02-24 NOTE — Telephone Encounter (Signed)
 Refilled 02/24/24, duplicate request.  Requested Prescriptions  Pending Prescriptions Disp Refills   albuterol  (VENTOLIN  HFA) 108 (90 Base) MCG/ACT inhaler 8.5 g 11    Sig: Inhale 2 puffs into the lungs every 4 (four) hours as needed for wheezing or shortness of breath.     Pulmonology:  Beta Agonists 2 Passed - 02/24/2024  3:57 PM      Passed - Last BP in normal range    BP Readings from Last 1 Encounters:  12/26/23 110/64         Passed - Last Heart Rate in normal range    Pulse Readings from Last 1 Encounters:  12/26/23 81         Passed - Valid encounter within last 12 months    Recent Outpatient Visits           2 months ago Unintentional weight loss   [facility] [place], Marsa PARAS, DO   4 months ago Centrilobular emphysema [facility])   [place] [place], Marsa PARAS, [place]

## 2024-02-24 NOTE — Telephone Encounter (Signed)
 Requested Prescriptions  Pending Prescriptions Disp Refills   albuterol  (VENTOLIN  HFA) 108 (90 Base) MCG/ACT inhaler [Pharmacy Med Name: ALBUTEROL  SULFATE HFA 108 (90 BASE)] 8.5 g 0    Sig: INHALE 2 PUFFS INTO THE LUNGS EVERY 4 HOURS AS NEEDED FOR WHEEZING OR SHORTNESS OF BREATH     Pulmonology:  Beta Agonists 2 Passed - 02/24/2024  3:43 PM      Passed - Last BP in normal range    BP Readings from Last 1 Encounters:  12/26/23 110/64         Passed - Last Heart Rate in normal range    Pulse Readings from Last 1 Encounters:  12/26/23 81         Passed - Valid encounter within last 12 months    Recent Outpatient Visits           2 months ago Unintentional weight loss   [facility] [place] J, DO   4 months ago Centrilobular emphysema [facility])   [place] [facility] [place], Marsa PARAS, [place]

## 2024-02-25 ENCOUNTER — Other Ambulatory Visit: Payer: Self-pay | Admitting: Family Medicine

## 2024-02-25 DIAGNOSIS — J432 Centrilobular emphysema: Secondary | ICD-10-CM

## 2024-02-26 NOTE — Telephone Encounter (Signed)
 Requested Prescriptions  Pending Prescriptions Disp Refills   albuterol  (PROVENTIL ) (2.5 MG/3ML) 0.083% nebulizer solution [Pharmacy Med Name: ALBUTEROL  SULFATE (2.5 MG/3ML) 0.08] 150 mL 3    Sig: USE 1 VIAL BY NEBULIZATION EVERY 4 HOURSAS NEEDED FOR WHEEZING OR SHORTNESS OF BREATH     Pulmonology:  Beta Agonists 2 Passed - 02/26/2024  3:47 PM      Passed - Last BP in normal range    BP Readings from Last 1 Encounters:  12/26/23 110/64         Passed - Last Heart Rate in normal range    Pulse Readings from Last 1 Encounters:  12/26/23 81         Passed - Valid encounter within last 12 months    Recent Outpatient Visits           2 months ago Unintentional weight loss   [facility] [place] J, DO   4 months ago Centrilobular emphysema [facility])   [place] [facility] [place], Marsa PARAS, [place]

## 2024-03-01 ENCOUNTER — Other Ambulatory Visit: Payer: Self-pay

## 2024-03-01 ENCOUNTER — Telehealth: Payer: Self-pay | Admitting: Pharmacist

## 2024-03-01 NOTE — Progress Notes (Unsigned)
   Outreach Note  03/01/2024 Name: Audrey Campbell MRN: [id number] DOB: 08/15/1955  Referred by: Edman Marsa PARAS, DO  Was unable to reach patient via telephone today and have left HIPAA compliant voicemail asking patient to return my call.    Follow Up Plan: Will collaborate with Care Guide to outreach to schedule follow up with me  Sharyle Sia, PharmD, JAQUELINE, CPP Clinical Pharmacist [facility] [phone]

## 2024-03-02 ENCOUNTER — Emergency Department

## 2024-03-02 ENCOUNTER — Other Ambulatory Visit: Payer: Self-pay

## 2024-03-02 ENCOUNTER — Inpatient Hospital Stay
Admission: EM | Admit: 2024-03-02 | Discharge: 2024-03-06 | DRG: 189 | Disposition: A | Discharge status: Home / Self Care | Attending: Internal Medicine | Admitting: Internal Medicine

## 2024-03-02 DIAGNOSIS — F17210 Nicotine dependence, cigarettes, uncomplicated: Secondary | ICD-10-CM | POA: Diagnosis present

## 2024-03-02 DIAGNOSIS — E782 Mixed hyperlipidemia: Secondary | ICD-10-CM | POA: Diagnosis present

## 2024-03-02 DIAGNOSIS — B9789 Other viral agents as the cause of diseases classified elsewhere: Secondary | ICD-10-CM | POA: Diagnosis present

## 2024-03-02 DIAGNOSIS — G894 Chronic pain syndrome: Secondary | ICD-10-CM | POA: Diagnosis present

## 2024-03-02 DIAGNOSIS — J9601 Acute respiratory failure with hypoxia: Principal | ICD-10-CM | POA: Diagnosis present

## 2024-03-02 DIAGNOSIS — J441 Chronic obstructive pulmonary disease with (acute) exacerbation: Principal | ICD-10-CM | POA: Diagnosis present

## 2024-03-02 DIAGNOSIS — J9602 Acute respiratory failure with hypercapnia: Secondary | ICD-10-CM | POA: Diagnosis present

## 2024-03-02 DIAGNOSIS — I10 Essential (primary) hypertension: Secondary | ICD-10-CM | POA: Diagnosis present

## 2024-03-02 DIAGNOSIS — F3341 Major depressive disorder, recurrent, in partial remission: Secondary | ICD-10-CM | POA: Diagnosis present

## 2024-03-02 DIAGNOSIS — Z833 Family history of diabetes mellitus: Secondary | ICD-10-CM

## 2024-03-02 DIAGNOSIS — E039 Hypothyroidism, unspecified: Secondary | ICD-10-CM | POA: Diagnosis present

## 2024-03-02 DIAGNOSIS — Z1152 Encounter for screening for COVID-19: Secondary | ICD-10-CM | POA: Diagnosis not present

## 2024-03-02 DIAGNOSIS — F411 Generalized anxiety disorder: Secondary | ICD-10-CM | POA: Diagnosis present

## 2024-03-02 DIAGNOSIS — J439 Emphysema, unspecified: Secondary | ICD-10-CM | POA: Diagnosis present

## 2024-03-02 DIAGNOSIS — Z79891 Long term (current) use of opiate analgesic: Secondary | ICD-10-CM | POA: Diagnosis not present

## 2024-03-02 DIAGNOSIS — Z79899 Other long term (current) drug therapy: Secondary | ICD-10-CM

## 2024-03-02 DIAGNOSIS — G4730 Sleep apnea, unspecified: Secondary | ICD-10-CM | POA: Diagnosis present

## 2024-03-02 DIAGNOSIS — R0602 Shortness of breath: Secondary | ICD-10-CM | POA: Diagnosis present

## 2024-03-02 DIAGNOSIS — Z79890 Hormone replacement therapy (postmenopausal): Secondary | ICD-10-CM

## 2024-03-02 LAB — CBC WITH DIFFERENTIAL/PLATELET
Abs Immature Granulocytes: 0.03 K/uL (ref 0.00–0.07)
Basophils Absolute: 0.1 K/uL (ref 0.0–0.1)
Basophils Relative: 1 %
Eosinophils Absolute: 0.2 K/uL (ref 0.0–0.5)
Eosinophils Relative: 2 %
HCT: 40.1 % (ref 36.0–46.0)
Hemoglobin: 12.7 g/dL (ref 12.0–15.0)
Immature Granulocytes: 0 %
Lymphocytes Relative: 41 %
Lymphs Abs: 3.4 K/uL (ref 0.7–4.0)
MCH: 27.8 pg (ref 26.0–34.0)
MCHC: 31.7 g/dL (ref 30.0–36.0)
MCV: 87.7 fL (ref 80.0–100.0)
Monocytes Absolute: 0.5 K/uL (ref 0.1–1.0)
Monocytes Relative: 6 %
Neutro Abs: 4.1 K/uL (ref 1.7–7.7)
Neutrophils Relative %: 50 %
Platelets: 221 K/uL (ref 150–400)
RBC: 4.57 MIL/uL (ref 3.87–5.11)
RDW: 15.8 % — ABNORMAL HIGH (ref 11.5–15.5)
WBC: 8.1 K/uL (ref 4.0–10.5)
nRBC: 0 % (ref 0.0–0.2)

## 2024-03-02 LAB — BLOOD GAS, VENOUS
Acid-Base Excess: 11.4 mmol/L — ABNORMAL HIGH (ref 0.0–2.0)
Acid-Base Excess: 9.7 mmol/L — ABNORMAL HIGH (ref 0.0–2.0)
Bicarbonate: 40.5 mmol/L — ABNORMAL HIGH (ref 20.0–28.0)
Bicarbonate: 35.4 mmol/L — ABNORMAL HIGH (ref 20.0–28.0)
O2 Saturation: 61.3 %
O2 Saturation: 84.1 %
Patient temperature: 37
Patient temperature: 37
pCO2, Ven: 75 mmHg (ref 44–60)
pCO2, Ven: 51 mmHg (ref 44–60)
pH, Ven: 7.34 (ref 7.25–7.43)
pH, Ven: 7.45 — ABNORMAL HIGH (ref 7.25–7.43)
pO2, Ven: 41 mmHg (ref 32–45)
pO2, Ven: 52 mmHg — ABNORMAL HIGH (ref 32–45)

## 2024-03-02 LAB — COMPREHENSIVE METABOLIC PANEL WITH GFR
ALT: 13 U/L (ref 0–44)
AST: 22 U/L (ref 15–41)
Albumin: 4 g/dL (ref 3.5–5.0)
Alkaline Phosphatase: 83 U/L (ref 38–126)
Anion gap: 9 (ref 5–15)
BUN: 12 mg/dL (ref 8–23)
CO2: 33 mmol/L — ABNORMAL HIGH (ref 22–32)
Calcium: 9.1 mg/dL (ref 8.9–10.3)
Chloride: 96 mmol/L — ABNORMAL LOW (ref 98–111)
Creatinine, Ser: 0.72 mg/dL (ref 0.44–1.00)
GFR, Estimated: >60 mL/min (ref >60)
Glucose, Bld: 92 mg/dL (ref 70–99)
Potassium: 4.1 mmol/L (ref 3.5–5.1)
Sodium: 138 mmol/L (ref 135–145)
Total Bilirubin: 0.4 mg/dL (ref 0.0–1.2)
Total Protein: 8.1 g/dL (ref 6.5–8.1)

## 2024-03-02 LAB — TROPONIN I (HIGH SENSITIVITY)
Troponin I (High Sensitivity): 6 ng/L (ref <18)
Troponin I (High Sensitivity): 7 ng/L (ref <18)

## 2024-03-02 LAB — RESP PANEL BY RT-PCR (RSV, FLU A&B, COVID)  RVPGX2
Influenza A by PCR: NEGATIVE
Influenza B by PCR: NEGATIVE
Resp Syncytial Virus by PCR: NEGATIVE
SARS Coronavirus 2 by RT PCR: NEGATIVE

## 2024-03-02 LAB — BRAIN NATRIURETIC PEPTIDE: B Natriuretic Peptide: 61.1 pg/mL (ref 0.0–100.0)

## 2024-03-02 LAB — HIV ANTIBODY (ROUTINE TESTING W REFLEX): HIV Screen 4th Generation wRfx: NONREACTIVE

## 2024-03-02 LAB — MAGNESIUM: Magnesium: 2.2 mg/dL (ref 1.7–2.4)

## 2024-03-02 MED ORDER — IPRATROPIUM-ALBUTEROL 0.5-2.5 (3) MG/3ML IN SOLN
3.0000 mL | Freq: Four times a day (QID) | RESPIRATORY_TRACT | Status: DC
  Administered 2024-03-02 – 2024-03-03 (×2): 3 mL via RESPIRATORY_TRACT
  Filled 2024-03-02 (×3): qty 3

## 2024-03-02 MED ORDER — BUDESONIDE 0.5 MG/2ML IN SUSP
0.5000 mg | Freq: Two times a day (BID) | RESPIRATORY_TRACT | Status: DC
  Administered 2024-03-02 – 2024-03-06 (×9): 0.5 mg via RESPIRATORY_TRACT
  Filled 2024-03-02 (×9): qty 2

## 2024-03-02 MED ORDER — ACETAMINOPHEN 325 MG PO TABS: 650.0000 mg | ORAL_TABLET | Freq: Four times a day (QID) | ORAL | Status: DC | PRN

## 2024-03-02 MED ORDER — SODIUM CHLORIDE 0.9 % IV SOLN
500.0000 mg | Freq: Every day | INTRAVENOUS | Status: DC
  Administered 2024-03-02 – 2024-03-03 (×2): 500 mg via INTRAVENOUS
  Filled 2024-03-02 (×2): qty 5

## 2024-03-02 MED ORDER — NICOTINE 14 MG/24HR TD PT24
14.0000 mg | MEDICATED_PATCH | Freq: Every day | TRANSDERMAL | Status: DC
  Administered 2024-03-02 – 2024-03-05 (×4): 14 mg via TRANSDERMAL
  Filled 2024-03-02 (×4): qty 1

## 2024-03-02 MED ORDER — HYDROXYZINE HCL 25 MG PO TABS
25.0000 mg | ORAL_TABLET | Freq: Every day | ORAL | Status: DC
  Administered 2024-03-02 – 2024-03-05 (×4): 25 mg via ORAL
  Filled 2024-03-02 (×5): qty 1

## 2024-03-02 MED ORDER — MONTELUKAST SODIUM 10 MG PO TABS
10.0000 mg | ORAL_TABLET | Freq: Every day | ORAL | Status: DC
  Administered 2024-03-02 – 2024-03-05 (×4): 10 mg via ORAL
  Filled 2024-03-02 (×4): qty 1

## 2024-03-02 MED ORDER — SODIUM CHLORIDE 0.9% FLUSH
3.0000 mL | Freq: Two times a day (BID) | INTRAVENOUS | Status: DC
  Administered 2024-03-02 – 2024-03-06 (×9): 3 mL via INTRAVENOUS

## 2024-03-02 MED ORDER — GUAIFENESIN ER 600 MG PO TB12
1200.0000 mg | ORAL_TABLET | Freq: Two times a day (BID) | ORAL | Status: DC
  Administered 2024-03-02 – 2024-03-06 (×9): 1200 mg via ORAL
  Filled 2024-03-02 (×9): qty 2

## 2024-03-02 MED ORDER — SODIUM CHLORIDE 0.9 % IV SOLN
2.0000 g | Freq: Once | INTRAVENOUS | Status: DC
  Administered 2024-03-02: 2 g via INTRAVENOUS
  Filled 2024-03-02: qty 20

## 2024-03-02 MED ORDER — IPRATROPIUM-ALBUTEROL 0.5-2.5 (3) MG/3ML IN SOLN
3.0000 mL | Freq: Four times a day (QID) | RESPIRATORY_TRACT | Status: DC
  Administered 2024-03-02: 3 mL via RESPIRATORY_TRACT
  Filled 2024-03-02: qty 3

## 2024-03-02 MED ORDER — IPRATROPIUM-ALBUTEROL 0.5-2.5 (3) MG/3ML IN SOLN
3.0000 mL | Freq: Once | RESPIRATORY_TRACT | Status: AC
  Administered 2024-03-02: 3 mL via RESPIRATORY_TRACT
  Filled 2024-03-02: qty 3

## 2024-03-02 MED ORDER — QUETIAPINE FUMARATE 25 MG PO TABS
200.0000 mg | ORAL_TABLET | Freq: Every day | ORAL | Status: DC
  Administered 2024-03-02 – 2024-03-05 (×4): 200 mg via ORAL
  Filled 2024-03-02 (×4): qty 8

## 2024-03-02 MED ORDER — LEVOTHYROXINE SODIUM 50 MCG PO TABS
100.0000 ug | ORAL_TABLET | Freq: Every day | ORAL | Status: DC
  Administered 2024-03-03 – 2024-03-04 (×2): 100 ug via ORAL
  Filled 2024-03-02 (×2): qty 2

## 2024-03-02 MED ORDER — ACETAMINOPHEN 650 MG RE SUPP: 650.0000 mg | Freq: Four times a day (QID) | RECTAL | Status: DC | PRN

## 2024-03-02 MED ORDER — SENNOSIDES-DOCUSATE SODIUM 8.6-50 MG PO TABS
1.0000 | ORAL_TABLET | Freq: Every evening | ORAL | Status: DC | PRN
  Filled 2024-03-02: qty 1

## 2024-03-02 MED ORDER — POLYETHYLENE GLYCOL 3350 17 G PO PACK
17.0000 g | PACK | Freq: Every day | ORAL | Status: DC | PRN
  Administered 2024-03-06: 17 g via ORAL
  Filled 2024-03-02 (×2): qty 1

## 2024-03-02 MED ORDER — PREDNISONE 20 MG PO TABS
40.0000 mg | ORAL_TABLET | Freq: Every day | ORAL | Status: AC
  Administered 2024-03-03 – 2024-03-06 (×4): 40 mg via ORAL
  Filled 2024-03-02 (×4): qty 2

## 2024-03-02 MED ORDER — ENOXAPARIN SODIUM 40 MG/0.4ML IJ SOSY
40.0000 mg | PREFILLED_SYRINGE | INTRAMUSCULAR | Status: DC
  Administered 2024-03-02 – 2024-03-05 (×4): 40 mg via SUBCUTANEOUS
  Filled 2024-03-02 (×4): qty 0.4

## 2024-03-02 MED ORDER — DOXYCYCLINE HYCLATE 100 MG PO TABS
100.0000 mg | ORAL_TABLET | Freq: Once | ORAL | Status: DC
  Filled 2024-03-02: qty 1

## 2024-03-02 MED ORDER — OXYCODONE HCL 5 MG PO TABS: 10.0000 mg | ORAL_TABLET | Freq: Four times a day (QID) | ORAL | Status: DC | PRN

## 2024-03-02 MED ORDER — IPRATROPIUM-ALBUTEROL 0.5-2.5 (3) MG/3ML IN SOLN: 3.0000 mL | Freq: Two times a day (BID) | RESPIRATORY_TRACT | Status: DC

## 2024-03-02 MED ORDER — HYDROXYZINE PAMOATE 25 MG PO CAPS
25.0000 mg | ORAL_CAPSULE | Freq: Every day | ORAL | Status: DC
  Filled 2024-03-02: qty 1

## 2024-03-02 MED ORDER — GABAPENTIN 400 MG PO CAPS
400.0000 mg | ORAL_CAPSULE | Freq: Three times a day (TID) | ORAL | Status: DC
  Administered 2024-03-02 – 2024-03-06 (×12): 400 mg via ORAL
  Filled 2024-03-02 (×12): qty 1

## 2024-03-02 NOTE — ED Triage Notes (Addendum)
 Pt arrives from home via [facility] with c/o SOB that started 2 days ago. Per EMS, pt reports not feeling well over the past week, hx of COPD, and wheezing in all lobes, SpO2 91% on RA when EMS arrived, pt put on 15L NRB and SpO2 came up to 100%. Pt denies pain at this time. Pt's breathing is regular, labored. Pt is A&Ox4.

## 2024-03-02 NOTE — Plan of Care (Signed)

## 2024-03-02 NOTE — H&P (Signed)
 History and Physical    Audrey Campbell FMW:[id number] DOB: [birth year] DOA: 03/02/2024  DOS: the patient was seen and examined on 03/02/2024  PCP: Edman Marsa PARAS, DO   Patient coming from: Home  I have personally briefly reviewed patient's old medical records in [facility] and CareEverywhere  HPI:   Audrey Campbell is a 68 y.o. year old female with past medical history of hypertension, hyperlipidemia, COPD presenting to the ED with worsening dyspnea with URI symptoms for the last 6 days.   Pt states he has been having runny nose congestion and malaise for about 6 days now.  Over the last 2 days she developed dyspnea which continued prompting her to come to the ED.  She denies any fevers and chills.  She also does not have GI symptoms such as nausea, vomiting, diarrhea.  She states she has sleep apnea and was told to wear oxygen  at nighttime.   On arrival to the ED patient was noted to be HDS stable.  Lab work and imaging was obtained that showed no leukocytosis, normal hemoglobin, overall unremarkable CMP except hypercarbia, normal BNP and normal troponin.  VBG was obtained that showed elevated pCO2 but normal pH.  Chest x-ray was obtained that did not show any acute findings but did show chronic bilateral middle lobe opacities.  Patient was given DuoNebs and Solu-Medrol  by EMS.  EDP gave 1 dose of doxycycline  and Rocephin.  Given ongoing need for care, [facility] contacted for admission.  Review of Systems: As mentioned in the history of present illness. All other systems reviewed and are negative.   Past Medical History:  Diagnosis Date   Anxiety    Arthritis    Chronic pain syndrome    COPD (chronic obstructive pulmonary disease) (HCC)    Hyperlipidemia    Hypertension    Hypothyroidism    On home oxygen  therapy    Plantar fasciitis     Past Surgical History:  Procedure Laterality Date   bulging disc in the neck     CATARACT EXTRACTION W/PHACO Right 11/25/2022    Procedure: CATARACT EXTRACTION PHACO AND INTRAOCULAR LENS PLACEMENT (IOC) RIGHT  11.84  01:01.5;  Surgeon: Myrna Adine Anes, MD;  Location: [facility];  Service: Ophthalmology;  Laterality: Right;   CATARACT EXTRACTION W/PHACO Left 01/06/2023   Procedure: CATARACT EXTRACTION PHACO AND INTRAOCULAR LENS PLACEMENT (IOC) LEFT  6.57  00:42.8;  Surgeon: Myrna Adine Anes, MD;  Location: [facility];  Service: Ophthalmology;  Laterality: Left;   COLONOSCOPY WITH PROPOFOL  N/A 12/29/2019   Procedure: COLONOSCOPY WITH PROPOFOL ;  Surgeon: Janalyn Keene NOVAK, MD;  Location: [facility] END[facility];  Service: Endoscopy;  Laterality: N/A;   COLONOSCOPY WITH PROPOFOL  N/A 01/24/2021   Procedure: COLONOSCOPY WITH PROPOFOL ;  Surgeon: Janalyn Keene NOVAK, MD;  Location: [facility] ENDOSCOPY;  Service: Endoscopy;  Laterality: N/A;   DILATION AND CURETTAGE OF UTERUS     ESOPHAGOGASTRODUODENOSCOPY (EGD) WITH PROPOFOL  N/A 12/29/2019   Procedure: ESOPHAGOGASTRODUODENOSCOPY (EGD) WITH PROPOFOL ;  Surgeon: Janalyn Keene NOVAK, MD;  Location: [facility] ENDOSCOPY;  Service: Endoscopy;  Laterality: N/A;   FOOT SURGERY Bilateral    heel  fasciitis     RHINOPLASTY     SHOULDER SURGERY     TUBAL LIGATION       Allergies  Allergen Reactions   Sulfa Antibiotics     Reports had reaction ~20 years ago   Penicillins Rash and Other (See Comments)    Has patient had a PCN reaction causing immediate rash,  facial/tongue/throat swelling, SOB or lightheadedness with hypotension: No Has patient had a PCN reaction causing severe rash involving mucus membranes or skin necrosis: No Has patient had a PCN reaction that required hospitalization: No Has patient had a PCN reaction occurring within the last 10 years: Yes If all of the above answers are NO, then may proceed with Cephalosporin use.     Family History  Problem Relation Age of Onset   Diabetes Father    Stroke Father 59   Thyroid  disease Father    Seizures Sister     Breast cancer Neg Hx     Prior to Admission medications   Medication Sig Start Date End Date Taking? Authorizing Provider  albuterol  (PROVENTIL ) (2.5 MG/3ML) 0.083% nebulizer solution USE 1 VIAL BY NEBULIZATION EVERY 4 HOURSAS NEEDED FOR WHEEZING OR SHORTNESS OF BREATH 02/26/24   Edman, Marsa PARAS, DO  albuterol  (VENTOLIN  HFA) 108 (90 Base) MCG/ACT inhaler INHALE 2 PUFFS INTO THE LUNGS EVERY 4 HOURS AS NEEDED FOR WHEEZING OR SHORTNESS OF BREATH 02/24/24   Karamalegos, Marsa PARAS, DO  BREZTRI  AEROSPHERE 160-9-4.8 MCG/ACT AERO INHALE 2 PUFFS BY MOUTH INTO THE LUNGS IN THE MORNING AND AT BEDTIME 06/06/20   Daphane Rosella, NP  butalbital -acetaminophen -caffeine  (FIORICET) 50-325-40 MG tablet TAKE 1 TABLET BY MOUTH EVERY 6 HOURS AS NEEDED FOR HEADACHE 02/04/24   Karamalegos, Marsa PARAS, DO  diclofenac  Sodium (VOLTAREN ) 1 % GEL Apply 2 g topically 4 (four) times daily as needed (arthritis knee pain). 09/26/22   Karamalegos, Marsa PARAS, DO  gabapentin  (NEURONTIN ) 400 MG capsule Take 1 capsule (400 mg total) by mouth 3 (three) times daily. 10/15/23   Karamalegos, Marsa PARAS, DO  hydrOXYzine  (VISTARIL ) 25 MG capsule Take 1 capsule (25 mg total) by mouth at bedtime. 02/25/23   Karamalegos, Marsa PARAS, DO  levothyroxine  (SYNTHROID ) 100 MCG tablet TAKE 1 TABLET BY MOUTH DAILY BEFORE BREAKFAST 02/12/24   Karamalegos, Marsa PARAS, DO  montelukast  (SINGULAIR ) 10 MG tablet TAKE 1 TABLET BY MOUTH DAILY 01/30/24   Edman, Marsa PARAS, DO  Oxycodone HCl 10 MG TABS Take 10 mg by mouth 4 (four) times daily as needed. 01/22/22   [provider]  polyethylene glycol (MIRALAX / GLYCOLAX) 17 g packet Take 17 g by mouth daily as needed.    [provider]  QUEtiapine  (SEROQUEL ) 100 MG tablet TAKE 2 TABLETS BY MOUTH AT BEDTIME 11/14/23   Karamalegos, Marsa PARAS, DO  rOPINIRole  (REQUIP ) 0.5 MG tablet Take 1-2 tablets (0.5-1 mg total) by mouth at bedtime as needed (restless leg syndrome). 05/21/23    Edman Marsa PARAS, DO    Social History:  reports that she has been smoking cigarettes. She started smoking about 51 years ago. She has a 38.9 pack-year smoking history. She has never used smokeless tobacco. She reports current drug use. Drug: Marijuana. She reports that she does not drink alcohol. Lives with husband Tobacco-half a pack a day since her teenage years EtOH- Denies use.  Illicit drug use- denies use.  IADLs/ADLs- can perform independently at baseline    Physical Exam: Vitals:   03/02/24 1521 03/02/24 1750 03/02/24 1958 03/02/24 2254  BP: (!) 165/87   (!) 163/72  Pulse: 79 77 90 81  Resp: 15  16 20   Temp: 98.5 F (36.9 C)  97.9 F (36.6 C)   TempSrc: Oral     SpO2: 93% 93% 94% 99%  Weight:      Height:         Gen: NAD HENT: NCAT  CV: Sinus rate and rhythm good pulses in all extremities Resp: Rhonchi present diffusely, no wheeze heard Abd: No TTP, normal bowel sounds MSK: No asymmetry Skin: No lesions noted on examined skin Neuro: Alert and oriented x 4 Psych: Pleasant mood   Labs on Admission: I have personally reviewed following labs and imaging studies  CBC: Recent Labs  Lab 03/02/24 1055  WBC 8.1  NEUTROABS 4.1  HGB 12.7  HCT 40.1  MCV 87.7  PLT 221   Basic Metabolic Panel: Recent Labs  Lab 03/02/24 1055 03/02/24 1348  NA 138  --   K 4.1  --   CL 96*  --   CO2 33*  --   GLUCOSE 92  --   BUN 12  --   CREATININE 0.72  --   CALCIUM  9.1  --   MG  --  2.2   GFR: Estimated Creatinine Clearance: 65.3 mL/min (by C-G formula based on SCr of 0.72 mg/dL). Liver Function Tests: Recent Labs  Lab 03/02/24 1055  AST 22  ALT 13  ALKPHOS 83  BILITOT 0.4  PROT 8.1  ALBUMIN 4.0   No results for input(s): LIPASE, AMYLASE in the last 168 hours. No results for input(s): AMMONIA in the last 168 hours. Coagulation Profile: No results for input(s): INR, PROTIME in the last 168 hours. Cardiac Enzymes: Recent Labs  Lab  03/02/24 1055 03/02/24 1348  TROPONINIHS 6 7   BNP (last 3 results) Recent Labs    03/02/24 1055  BNP 61.1   HbA1C: No results for input(s): HGBA1C in the last 72 hours. CBG: No results for input(s): GLUCAP in the last 168 hours. Lipid Profile: No results for input(s): CHOL, HDL, LDLCALC, TRIG, CHOLHDL, LDLDIRECT in the last 72 hours. Thyroid  Function Tests: No results for input(s): TSH, T4TOTAL, FREET4, T3FREE, THYROIDAB in the last 72 hours. Anemia Panel: No results for input(s): VITAMINB12, FOLATE, FERRITIN, TIBC, IRON, RETICCTPCT in the last 72 hours. Urine analysis: No results found for: COLORURINE, APPEARANCEUR, LABSPEC, PHURINE, GLUCOSEU, HGBUR, BILIRUBINUR, KETONESUR, PROTEINUR, UROBILINOGEN, NITRITE, LEUKOCYTESUR  Radiological Exams on Admission: I have personally reviewed images DG Chest 2 View Result Date: 03/02/2024 EXAM: 2 VIEW(S) XRAY OF THE CHEST 03/02/2024 11:04:00 AM COMPARISON: Chest CT 12/30/2023 and earlier. CLINICAL HISTORY: 68 year old female. Cough. FINDINGS: LUNGS AND PLEURA: Lung volumes are stable. Coarse bilateral middle lobe opacity appears chronic and stable. No acute lung opacity. No pulmonary edema. No pleural effusion. No pneumothorax. HEART AND MEDIASTINUM: Mediastinal contours are stable. No acute abnormality of the cardiac silhouette. BONES AND SOFT TISSUES: Probable external artifact projecting over the thoracolumbar junction on the lateral view. No acute osseous abnormality. IMPRESSION: 1. No acute cardiopulmonary abnormality. 2. Chronic middle lobe lung disease. Electronically signed by: Helayne Hurst MD 03/02/2024 12:14 PM EST RP Workstation: [id number]    EKG: My personal interpretation of EKG shows: Normal sinus rhythm, without any acute ST changes.    Assessment/Plan Principal Problem:   COPD exacerbation (HCC) Active Problems:   Hypothyroidism   Major depressive disorder,  recurrent, in partial remission   GAD (generalized anxiety disorder)   Mixed hyperlipidemia   Essential hypertension   COPD Exacerbation Pt with cardinal symptoms of COPD exacerbation. Pt status post nebulizers, and Solumedrol.  Was on BiPAP but this has been weaned off.  Her VBG showed hypercarbia with normal pH which could be secondary to her supplemental oxygen  use at home. - Start prednisone  40mg  for 5 day course - Azithromycin  500mg  x3 doses - DuoNebs  q6hr - Keep O2 sat >88 - Wean off Oxygen  if able  Chronic problems: Hypertension: Elevated BP here and not on any pharmacotherapy.  Continue to monitor. Hypothyroidism: Continue home Synthroid . Tobacco use disorder: Provided extensive counseling on tobacco cessation as she is getting close surveillance with low-dose CT.  Patient understanding and will try to abstain. Chronic pain syndrome: On oxycodone and gabapentin .  Will continue home regimen.  Will defer slowly decreasing to PCP. OSA: I was not able to locate a sleep study in her chart but patient states she had 1 done and was recommended supplemental oxygen  nightly.  I discussed with her to follow-up with her PCP as she may be a candidate for CPAP.  VTE prophylaxis:  Lovenox   Diet: NPO Code Status:  Full Code Telemetry:  Admission status: Inpatient, Med-Surg Patient is from: Home Anticipated d/c is to: Home Anticipated d/c is in: 1-2 days   Family Communication: Updated at bedside  Consults called: None   The appropriate patient status for this patient is INPATIENT. Inpatient status is judged to be reasonable and necessary in order to provide the required intensity of service to ensure the patient's safety. The patient's presenting symptoms, physical exam findings, and initial radiographic and laboratory data in the context of their chronic comorbidities is felt to place them at high risk for further clinical deterioration. Furthermore, it is not anticipated that the patient  will be medically stable for discharge from the hospital within 2 midnights of admission.   * I certify that at the point of admission it is my clinical judgment that the patient will require inpatient hospital care spanning beyond 2 midnights from the point of admission due to high intensity of service, high risk for further deterioration and high frequency of surveillance required.DEWAINE Morene Bathe, MD Jolynn DEL. [facility]

## 2024-03-02 NOTE — ED Provider Notes (Signed)
 [facility] Provider Note    Event Date/Time   First MD Initiated Contact with Patient 03/02/24 1038     (approximate)   History   Shortness of Breath   HPI  Audrey Campbell is a 68 y.o. female with emphysema who comes in with concerns for shortness of breath.  Patient reports since last Wednesday she has been having a runny nose, congestion.  She reports that the last 2 or 3 days it started to gone down into her chest and now she is having a very productive cough with mucus production.  This is led to increasing shortness of breath.  She reports being on oxygen  at nighttime but unclear how much.  She denies any swelling in her legs, pain in her abdomen or falls or hitting her head or any other concerns.  Reviewed the records and patient had a CT screening of her lungs on 9/2 without any evidence of lung cancer  With EMS patient was initially 91% it.  She had tight air exchange with wheezing noted and given 2 DuoNebs, Solu-Medrol .  Physical Exam   Triage Vital Signs: ED Triage Vitals [03/02/24 1036]  Encounter Vitals Group     BP      Girls Systolic BP Percentile      Girls Diastolic BP Percentile      Boys Systolic BP Percentile      Boys Diastolic BP Percentile      Pulse      Resp      Temp      Temp src      SpO2 91 %     Weight      Height      Head Circumference      Peak Flow      Pain Score      Pain Loc      Pain Education      Exclude from Growth Chart     Most recent vital signs: Vitals:   03/02/24 1036  SpO2: 91%     General: Awake, no distress.  CV:  Good peripheral perfusion.  Resp:  Normal effort but tight air exchange, productive cough noted Abd:  No distention.  Soft and nontender Other:  No swelling legs.  No calf tenderness   ED Results / Procedures / Treatments   Labs (all labs ordered are listed, but only abnormal results are displayed) Labs Reviewed  CBC WITH DIFFERENTIAL/PLATELET - Abnormal; Notable  for the following components:      Result Value   RDW 15.8 (*)    All other components within normal limits  COMPREHENSIVE METABOLIC PANEL WITH GFR - Abnormal; Notable for the following components:   Chloride 96 (*)    CO2 33 (*)    All other components within normal limits  BLOOD GAS, VENOUS - Abnormal; Notable for the following components:   pCO2, Ven 75 (*)    Bicarbonate 40.5 (*)    Acid-Base Excess 11.4 (*)    All other components within normal limits  RESP PANEL BY RT-PCR (RSV, FLU A&B, COVID)  RVPGX2  BRAIN NATRIURETIC PEPTIDE  TROPONIN I (HIGH SENSITIVITY)  TROPONIN I (HIGH SENSITIVITY)     EKG  My interpretation of EKG:  Normal sinus rate of 74 without any ST elevation or T wave inversions.  RADIOLOGY I have reviewed the xray personally and interpreted no obvious focal pneumonia   PROCEDURES:  Critical Care performed: Yes, see critical care procedure note(s)  .1-3  Lead EKG Interpretation  Performed by: Ernest Ronal BRAVO, MD Authorized by: Ernest Ronal BRAVO, MD     Interpretation: normal     ECG rate:  60   ECG rate assessment: normal     Rhythm: sinus rhythm     Ectopy: none     Conduction: normal   .Critical Care  Performed by: Ernest Ronal BRAVO, MD Authorized by: Ernest Ronal BRAVO, MD   Critical care provider statement:    Critical care time (minutes):  30   Critical care was necessary to treat or prevent imminent or life-threatening deterioration of the following conditions:  Respiratory failure   Critical care was time spent personally by me on the following activities:  Development of treatment plan with patient or surrogate, discussions with consultants, evaluation of patient's response to treatment, examination of patient, ordering and review of laboratory studies, ordering and review of radiographic studies, ordering and performing treatments and interventions, pulse oximetry, re-evaluation of patient's condition and review of old charts    MEDICATIONS ORDERED  IN ED: Medications  ipratropium-albuterol  (DUONEB) 0.5-2.5 (3) MG/3ML nebulizer solution 3 mL (3 mLs Nebulization Given by Other 03/02/24 1146)     IMPRESSION / MDM / ASSESSMENT AND PLAN / ED COURSE  I reviewed the triage vital signs and the nursing notes.   Patient's presentation is most consistent with acute presentation with potential threat to life or bodily function.   Differential is COVID, flu, ACS, pneumonia, other viral illness, COPD exacerbation.  I will give additional DuoNeb given tight air exchange bilaterally.  PE seems less likely given patient's respiratory symptoms no evidence of DVT on examination.  Patient's VBG came back with elevated CO2.  She was also taken off of the oxygen  and desatted down to 88 to 89% therefore we will place patient on BiPAP to help facilitate CO2 removal as well as help with oxygenation.    Troponin was negative.  CBC is reassuring CMP shows elevated bicarb.  Reevaluated patient looking much more comfortable on the BiPAP.  Discussed with patient given COPD exacerbation with signs I suspect an occult pneumonia will discuss to the hospital team for admission.  Patient felt comfortable with this plan.    The patient is on the cardiac monitor to evaluate for evidence of arrhythmia and/or significant heart rate changes.      FINAL CLINICAL IMPRESSION(S) / ED DIAGNOSES   Final diagnoses:  COPD exacerbation (HCC)     Rx / DC Orders   ED Discharge Orders     None        Note:  This document was prepared using Dragon voice recognition software and may include unintentional dictation errors.   Ernest Ronal BRAVO, MD 03/02/24 [id number]

## 2024-03-02 NOTE — ED Notes (Signed)
 Called CCMD to add pt to monitoring.

## 2024-03-02 NOTE — ED Notes (Signed)
 NURSE BRANDON INFORMED OF ASSIGNED BED

## 2024-03-03 DIAGNOSIS — E039 Hypothyroidism, unspecified: Secondary | ICD-10-CM

## 2024-03-03 DIAGNOSIS — I10 Essential (primary) hypertension: Secondary | ICD-10-CM | POA: Diagnosis not present

## 2024-03-03 DIAGNOSIS — J9601 Acute respiratory failure with hypoxia: Secondary | ICD-10-CM

## 2024-03-03 DIAGNOSIS — F17210 Nicotine dependence, cigarettes, uncomplicated: Secondary | ICD-10-CM

## 2024-03-03 DIAGNOSIS — J9602 Acute respiratory failure with hypercapnia: Secondary | ICD-10-CM

## 2024-03-03 DIAGNOSIS — J441 Chronic obstructive pulmonary disease with (acute) exacerbation: Secondary | ICD-10-CM | POA: Diagnosis not present

## 2024-03-03 DIAGNOSIS — F411 Generalized anxiety disorder: Secondary | ICD-10-CM

## 2024-03-03 DIAGNOSIS — G894 Chronic pain syndrome: Secondary | ICD-10-CM

## 2024-03-03 LAB — CBC
HCT: 35.4 % — ABNORMAL LOW (ref 36.0–46.0)
Hemoglobin: 11.2 g/dL — ABNORMAL LOW (ref 12.0–15.0)
MCH: 27.3 pg (ref 26.0–34.0)
MCHC: 31.6 g/dL (ref 30.0–36.0)
MCV: 86.3 fL (ref 80.0–100.0)
Platelets: 203 K/uL (ref 150–400)
RBC: 4.1 MIL/uL (ref 3.87–5.11)
RDW: 15.5 % (ref 11.5–15.5)
WBC: 6.4 K/uL (ref 4.0–10.5)
nRBC: 0 % (ref 0.0–0.2)

## 2024-03-03 LAB — RESPIRATORY PANEL BY PCR

## 2024-03-03 LAB — BASIC METABOLIC PANEL WITH GFR
Anion gap: 6 (ref 5–15)
BUN: 14 mg/dL (ref 8–23)
CO2: 29 mmol/L (ref 22–32)
Calcium: 8.7 mg/dL — ABNORMAL LOW (ref 8.9–10.3)
Chloride: 99 mmol/L (ref 98–111)
Creatinine, Ser: 0.72 mg/dL (ref 0.44–1.00)
GFR, Estimated: >60 mL/min (ref >60)
Glucose, Bld: 113 mg/dL — ABNORMAL HIGH (ref 70–99)
Potassium: 4 mmol/L (ref 3.5–5.1)
Sodium: 134 mmol/L — ABNORMAL LOW (ref 135–145)

## 2024-03-03 LAB — GLUCOSE, CAPILLARY: Glucose-Capillary: 98 mg/dL (ref 70–99)

## 2024-03-03 MED ORDER — HYDRALAZINE HCL 25 MG PO TABS
25.0000 mg | ORAL_TABLET | Freq: Once | ORAL | Status: AC
  Administered 2024-03-03: 25 mg via ORAL
  Filled 2024-03-03: qty 1

## 2024-03-03 MED ORDER — HYDRALAZINE HCL 20 MG/ML IJ SOLN
10.0000 mg | Freq: Four times a day (QID) | INTRAMUSCULAR | Status: DC | PRN
  Administered 2024-03-03 (×2): 10 mg via INTRAVENOUS
  Filled 2024-03-03 (×2): qty 1

## 2024-03-03 MED ORDER — IPRATROPIUM-ALBUTEROL 0.5-2.5 (3) MG/3ML IN SOLN
3.0000 mL | RESPIRATORY_TRACT | Status: DC
  Administered 2024-03-03 – 2024-03-04 (×5): 3 mL via RESPIRATORY_TRACT
  Filled 2024-03-03 (×5): qty 3

## 2024-03-03 MED ORDER — HYDRALAZINE HCL 50 MG PO TABS: 50.0000 mg | ORAL_TABLET | Freq: Once | ORAL | Status: DC

## 2024-03-03 MED ORDER — LOSARTAN POTASSIUM 50 MG PO TABS
50.0000 mg | ORAL_TABLET | Freq: Every day | ORAL | Status: DC
  Administered 2024-03-03 – 2024-03-06 (×4): 50 mg via ORAL
  Filled 2024-03-03 (×4): qty 1

## 2024-03-03 MED ORDER — AZITHROMYCIN 500 MG PO TABS
500.0000 mg | ORAL_TABLET | Freq: Every day | ORAL | Status: AC
  Administered 2024-03-04: 500 mg via ORAL
  Filled 2024-03-03: qty 1

## 2024-03-03 NOTE — Assessment & Plan Note (Signed)
 Patient admitted with concern of COPD exacerbation, still having significant shortness of breath and wheezing.  Chest x-ray negative for any acute abnormality. - Continue with Zithromax  -Continue with prednisone  -Increasing the frequency of DuoNeb -Continue with supportive care

## 2024-03-03 NOTE — Assessment & Plan Note (Signed)
 Continue home Seroquel

## 2024-03-03 NOTE — Assessment & Plan Note (Signed)
 Blood pressure significantly elevated.  Patient was not on any antihypertensives at home. - Started on losartan -As needed hydralazine 

## 2024-03-03 NOTE — Assessment & Plan Note (Signed)
 Continue home Synthroid

## 2024-03-03 NOTE — Progress Notes (Signed)
  Progress Note   Patient: Audrey Campbell FMW:[id number] DOB: 31-Jul-1955 DOA: 03/02/2024     1 DOS: the patient was seen and examined on 03/03/2024   Brief hospital course: Partly taken from H&P.  Audrey Campbell is a 68 y.o. year old female with past medical history of hypertension, hyperlipidemia, COPD presenting to the ED with worsening dyspnea with URI symptoms for the last 6 days.  Denies any fever or chills.  Patient has sleep apnea and uses oxygen  at night.  On presentation hemodynamically stable, labs mostly unremarkable except mild hypercarbia.  Chest x-ray without any acute abnormality but did show chronic bilateral middle lobe opacities.  Patient received Solu-Medrol  and DuoNeb in ED and was admitted for COPD exacerbation.  She was placed on BiPAP transiently and able to wean off quickly  11/5: Blood pressure elevated at 188/91, nonsignificant mild hyponatremia at 134, CO2 improved to 29.  Started on losartan 50 mg daily.  Assessment and Plan: * COPD exacerbation [facility]) Patient admitted with concern of COPD exacerbation, still having significant shortness of breath and wheezing.  Chest x-ray negative for any acute abnormality. - Continue with Zithromax  -Continue with prednisone  -Increasing the frequency of DuoNeb -Continue with supportive care  Acute respiratory failure with hypoxia and hypercapnia (HCC) Initial VBG with concern of hypercarbia and patient required BiPAP initially due to increased work of breathing.  Uses oxygen  just at night at baseline. Currently on 2 L of oxygen . - Continue supplemental oxygen -wean as tolerated  Essential hypertension Blood pressure significantly elevated.  Patient was not on any antihypertensives at home. - Started on losartan -As needed hydralazine   Hypothyroidism - Continue home Synthroid   Chronic pain disorder - Continue home oxycodone and gabapentin   GAD (generalized anxiety disorder) - Continue home Seroquel   Continuous  dependence on cigarette smoking Counseling was provided.  Patient had some pulmonary nodules which are being surveillance by CT scan. - Nicotine  patch as needed   Subjective: Patient continued to feel short of breath, which significantly increased when she uses the bathroom without oxygen .  Physical Exam: Vitals:   03/03/24 0617 03/03/24 0855 03/03/24 0934 03/03/24 1032  BP: (!) 188/91 (!) 206/105 (!) 207/103 (!) 186/90  Pulse: 61 77 68 91  Resp: 19 19 17    Temp:  97.8 F (36.6 C) 97.6 F (36.4 C)   TempSrc:  Oral Oral   SpO2: 100% 96% 97%   Weight:      Height:       General.  Frail elderly lady, in no acute distress. Pulmonary.  Bilateral rales scattered wheeze, mildly increased work of breathing CV.  Regular rate and rhythm, no JVD, rub or murmur. Abdomen.  Soft, nontender, nondistended, BS positive. CNS.  Alert and oriented .  No focal neurologic deficit. Extremities.  No edema,  pulses intact and symmetrical. Psychiatry.  Judgment and insight appears normal.   Data Reviewed: Prior data reviewed  Family Communication: Discussed with husband at bedside  Disposition: Status is: Inpatient Remains inpatient appropriate because: Severity of illness  Planned Discharge Destination: Home  PT prophylaxis.  Lovenox  Time spent: 50 minutes  This record has been created using Conservation officer, historic buildings. Errors have been sought and corrected,but may not always be located. Such creation errors do not reflect on the standard of care.   Author: Amaryllis Dare, MD 03/03/2024 1:31 PM  For on call review [email or web address].

## 2024-03-03 NOTE — Assessment & Plan Note (Signed)
-   Continue home oxycodone and gabapentin

## 2024-03-03 NOTE — Hospital Course (Addendum)
 Partly taken from H&P.  Audrey Campbell is a 68 y.o. year old female with past medical history of hypertension, hyperlipidemia, COPD presenting to the ED with worsening dyspnea with URI symptoms for the last 6 days.  Denies any fever or chills.  Patient has sleep apnea and uses oxygen  at night.  On presentation hemodynamically stable, labs mostly unremarkable except mild hypercarbia.  Chest x-ray without any acute abnormality but did show chronic bilateral middle lobe opacities.  Patient received Solu-Medrol  and DuoNeb in ED and was admitted for COPD exacerbation.  She was placed on BiPAP transiently and able to wean off quickly  11/5: Blood pressure elevated at 188/91, nonsignificant mild hyponatremia at 134, CO2 improved to 29.  Started on losartan 50 mg daily.  11/6: Blood pressure still mildly elevated but much improved, still wheezing and needing oxygen .  Patient might need to go home with oxygen  as she was on 2 L of oxygen  at night before coming to the hospital.  RVP positive for rhinovirus.  11/7: Azithromycin  250 mg p.o. initiated today to complete a 5-day course.  11/8: Patient received azithromycin  500 mg p.o. on 11/6.  Azithromycin  to 50 mg p.o. was initiated on 11/7 and patient received dose on 11/8.  Patient will be discharged home with Rx prescription to complete a 5-day course.  Mucomyst and nebulizer has been prescribed.

## 2024-03-03 NOTE — Assessment & Plan Note (Signed)
 Counseling was provided.  Patient had some pulmonary nodules which are being surveillance by CT scan. - Nicotine  patch as needed

## 2024-03-03 NOTE — Plan of Care (Signed)
  Problem: Education: Goal: Knowledge of General Education information will improve Description: Including pain rating scale, medication(s)/side effects and non-pharmacologic comfort measures Outcome: Progressing   Problem: Clinical Measurements: Goal: Diagnostic test results will improve Outcome: Progressing   Problem: Activity: Goal: Risk for activity intolerance will decrease Outcome: Progressing   Problem: Coping: Goal: Level of anxiety will decrease Outcome: Progressing

## 2024-03-03 NOTE — Assessment & Plan Note (Signed)
 Initial VBG with concern of hypercarbia and patient required BiPAP initially due to increased work of breathing.  Uses oxygen  just at night at baseline. Currently on 2 L of oxygen . - Continue supplemental oxygen -wean as tolerated

## 2024-03-04 DIAGNOSIS — J9601 Acute respiratory failure with hypoxia: Secondary | ICD-10-CM | POA: Diagnosis not present

## 2024-03-04 DIAGNOSIS — I10 Essential (primary) hypertension: Secondary | ICD-10-CM | POA: Diagnosis not present

## 2024-03-04 DIAGNOSIS — E039 Hypothyroidism, unspecified: Secondary | ICD-10-CM | POA: Diagnosis not present

## 2024-03-04 DIAGNOSIS — J441 Chronic obstructive pulmonary disease with (acute) exacerbation: Secondary | ICD-10-CM | POA: Diagnosis not present

## 2024-03-04 LAB — GLUCOSE, CAPILLARY
Glucose-Capillary: 137 mg/dL — ABNORMAL HIGH (ref 70–99)
Glucose-Capillary: 152 mg/dL — ABNORMAL HIGH (ref 70–99)

## 2024-03-04 MED ORDER — IPRATROPIUM-ALBUTEROL 0.5-2.5 (3) MG/3ML IN SOLN
3.0000 mL | Freq: Four times a day (QID) | RESPIRATORY_TRACT | Status: DC
  Administered 2024-03-04 – 2024-03-06 (×9): 3 mL via RESPIRATORY_TRACT
  Filled 2024-03-04 (×9): qty 3

## 2024-03-04 NOTE — Assessment & Plan Note (Addendum)
 Patient admitted with concern of COPD exacerbation, still having significant shortness of breath and wheezing.  Chest x-ray negative for any acute abnormality. Continue with Zithromax  Continue with prednisone  Increasing the frequency of DuoNeb Continue with supportive care  DuoNeb and Mucomyst prescribed on discharge.  DME for nebulizer machine ordered on discharge.

## 2024-03-04 NOTE — Assessment & Plan Note (Signed)
Levothyroxine 100 mcg daily.

## 2024-03-04 NOTE — Assessment & Plan Note (Signed)
 Initial VBG with concern of hypercarbia and patient required BiPAP initially due to increased work of breathing.  Uses oxygen  just at night at baseline. Currently on 2 L of oxygen . - Continue supplemental oxygen -wean as tolerated

## 2024-03-04 NOTE — Progress Notes (Signed)
 Progress Note   Patient: Audrey Campbell FMW:[id number] DOB: 1955-12-05 DOA: 03/02/2024     2 DOS: the patient was seen and examined on 03/04/2024   Brief hospital course: Partly taken from H&P.  Audrey Campbell is a 68 y.o. year old female with past medical history of hypertension, hyperlipidemia, COPD presenting to the ED with worsening dyspnea with URI symptoms for the last 6 days.  Denies any fever or chills.  Patient has sleep apnea and uses oxygen  at night.  On presentation hemodynamically stable, labs mostly unremarkable except mild hypercarbia.  Chest x-ray without any acute abnormality but did show chronic bilateral middle lobe opacities.  Patient received Solu-Medrol  and DuoNeb in ED and was admitted for COPD exacerbation.  She was placed on BiPAP transiently and able to wean off quickly  11/5: Blood pressure elevated at 188/91, nonsignificant mild hyponatremia at 134, CO2 improved to 29.  Started on losartan 50 mg daily.  11/6: Blood pressure still mildly elevated but much improved, still wheezing and needing oxygen .  Patient might need to go home with oxygen  as she was on 2 L of oxygen  at night before coming to the hospital.  RVP positive for rhinovirus.  Assessment and Plan: * COPD exacerbation [facility]) Patient admitted with concern of COPD exacerbation, still having significant shortness of breath and wheezing.  Chest x-ray negative for any acute abnormality. - Continue with Zithromax  -Continue with prednisone  -Increasing the frequency of DuoNeb -Continue with supportive care  Acute respiratory failure with hypoxia and hypercapnia (HCC) Initial VBG with concern of hypercarbia and patient required BiPAP initially due to increased work of breathing.  Uses oxygen  just at night at baseline. Currently on 2 L of oxygen . - Continue supplemental oxygen -wean as tolerated  Essential hypertension Blood pressure mildly elevated.  Patient was not on any antihypertensives at home. -  Started on losartan-continue at current dose, she will need titration which can be done by PCP -As needed hydralazine   Hypothyroidism - Continue home Synthroid   Chronic pain disorder - Continue home oxycodone and gabapentin   GAD (generalized anxiety disorder) - Continue home Seroquel   Continuous dependence on cigarette smoking Counseling was provided.  Patient had some pulmonary nodules which are being surveillance by CT scan. - Nicotine  patch as needed   Subjective: Patient continued to feel little short of breath, having cough and congestion.  Physical Exam: Vitals:   03/04/24 0421 03/04/24 0500 03/04/24 0739 03/04/24 0924  BP: (!) 157/75   (!) 144/76  Pulse: 74   84  Resp: 16   16  Temp: (!) 97.5 F (36.4 C)   98.1 F (36.7 C)  TempSrc:      SpO2: 99%  97% 97%  Weight:  69 kg    Height:       General.  Frail lady, in no acute distress. Pulmonary.  Scattered wheeze bilaterally, normal respiratory effort. CV.  Regular rate and rhythm, no JVD, rub or murmur. Abdomen.  Soft, nontender, nondistended, BS positive. CNS.  Alert and oriented .  No focal neurologic deficit. Extremities.  No edema,  pulses intact and symmetrical. Psychiatry.  Judgment and insight appears normal.   Data Reviewed: Prior data reviewed  Family Communication: Discussed with patient  Disposition: Status is: Inpatient Remains inpatient appropriate because: Severity of illness  Planned Discharge Destination: Home  PT prophylaxis.  Lovenox  Time spent: 50 minutes  This record has been created using Conservation officer, historic buildings. Errors have been sought and corrected,but may not always be located. Such  creation errors do not reflect on the standard of care.   Author: Amaryllis Dare, MD 03/04/2024 2:43 PM  For on call review [email or web address].

## 2024-03-04 NOTE — Progress Notes (Signed)
  SATURATION QUALIFICATIONS: (This note is used to comply with regulatory documentation for home oxygen)  Patient Saturations on Room Air at Rest = 88%  Patient Saturations on Room Air while Ambulating = 0%  Patient Saturations on 2 Liters of oxygen while Ambulating = 92%  Please briefly explain why patient needs home oxygen:

## 2024-03-04 NOTE — Plan of Care (Signed)
  Problem: Clinical Measurements: Goal: Diagnostic test results will improve Outcome: Progressing   Problem: Coping: Goal: Level of anxiety will decrease Outcome: Progressing   Problem: Elimination: Goal: Will not experience complications related to urinary retention Outcome: Progressing   Problem: Pain Managment: Goal: General experience of comfort will improve and/or be controlled Outcome: Progressing

## 2024-03-04 NOTE — Assessment & Plan Note (Signed)
 Blood pressure mildly elevated.  Patient was not on any antihypertensives at home. Started on losartan-continue at current dose, she will need titration which can be done by PCP As needed hydralazine 

## 2024-03-05 DIAGNOSIS — J441 Chronic obstructive pulmonary disease with (acute) exacerbation: Secondary | ICD-10-CM | POA: Diagnosis not present

## 2024-03-05 LAB — GLUCOSE, CAPILLARY: Glucose-Capillary: 81 mg/dL (ref 70–99)

## 2024-03-05 MED ORDER — NICOTINE 14 MG/24HR TD PT24: 14.0000 mg | MEDICATED_PATCH | Freq: Every day | TRANSDERMAL | Status: DC | PRN

## 2024-03-05 MED ORDER — AZITHROMYCIN 250 MG PO TABS
250.0000 mg | ORAL_TABLET | Freq: Every day | ORAL | Status: DC
  Administered 2024-03-05 – 2024-03-06 (×2): 250 mg via ORAL
  Filled 2024-03-05 (×2): qty 1

## 2024-03-05 MED ORDER — LEVOTHYROXINE SODIUM 50 MCG PO TABS
100.0000 ug | ORAL_TABLET | Freq: Every day | ORAL | Status: DC
  Administered 2024-03-05 – 2024-03-06 (×2): 100 ug via ORAL
  Filled 2024-03-05 (×2): qty 2

## 2024-03-05 MED ORDER — ACETYLCYSTEINE 20 % IN SOLN
3.0000 mL | Freq: Three times a day (TID) | RESPIRATORY_TRACT | Status: DC
  Administered 2024-03-06 (×3): 3 mL via RESPIRATORY_TRACT
  Filled 2024-03-05 (×5): qty 4

## 2024-03-05 NOTE — Progress Notes (Signed)
 PROGRESS NOTE  Audrey Campbell  FMW:[id number] DOB: [birth year] DOA: 03/02/2024 PCP: Edman Marsa PARAS, DO   Partly taken from H&P.  Audrey Campbell is a 68 y.o. year old female with past medical history of hypertension, hyperlipidemia, COPD presenting to the ED with worsening dyspnea with URI symptoms for the last 6 days.  Denies any fever or chills.  Patient has sleep apnea and uses oxygen  at night.  On presentation hemodynamically stable, labs mostly unremarkable except mild hypercarbia.  Chest x-ray without any acute abnormality but did show chronic bilateral middle lobe opacities.  Patient received Solu-Medrol  and DuoNeb in ED and was admitted for COPD exacerbation.  She was placed on BiPAP transiently and able to wean off quickly  11/5: Blood pressure elevated at 188/91, nonsignificant mild hyponatremia at 134, CO2 improved to 29.  Started on losartan 50 mg daily.  11/6: Blood pressure still mildly elevated but much improved, still wheezing and needing oxygen .  Patient might need to go home with oxygen  as she was on 2 L of oxygen  at night before coming to the hospital.  RVP positive for rhinovirus.  03/05/24: Azithromycin  250 mg p.o. initiated today to complete a 5-day course.  Assessment & Plan:   Principal Problem:   COPD exacerbation (HCC) Active Problems:   Acute respiratory failure with hypoxia and hypercapnia (HCC)   Hypothyroidism   Chronic pain disorder   Major depressive disorder, recurrent, in partial remission   GAD (generalized anxiety disorder)   Essential hypertension   Continuous dependence on cigarette smoking   Assessment and Plan:  * COPD exacerbation (HCC) Patient admitted with concern of COPD exacerbation, still having significant shortness of breath and wheezing.  Chest x-ray negative for any acute abnormality. Continue with Zithromax  Continue with prednisone  Increasing the frequency of DuoNeb Continue with supportive care  Acute  respiratory failure with hypoxia and hypercapnia (HCC) Initial VBG with concern of hypercarbia and patient required BiPAP initially due to increased work of breathing.  Uses oxygen  just at night at baseline. Currently on 2 L of oxygen . Continue supplemental oxygen -wean as tolerated  Chronic pain disorder Continue home oxycodone and gabapentin   Hypothyroidism Levothyroxine  100 mcg daily  Continuous dependence on cigarette smoking Counseling was provided.  Patient had some pulmonary nodules which are being surveillance by CT scan. Nicotine  patch as needed  Essential hypertension Blood pressure mildly elevated.  Patient was not on any antihypertensives at home. Started on losartan-continue at current dose, she will need titration which can be done by PCP As needed hydralazine   GAD (generalized anxiety disorder) Continue home quetiapine  200 mg nightly  DVT prophylaxis: Enoxaparin  Code Status: Full code Family Communication: No Disposition Plan: Pending clinical course, anticipate discharge on 11/8 Level of care: Med-Surg  Consultants:  RT  Procedures:  None at this time  Antimicrobials: Azithromycin   Subjective: At bedside, patient able to tell me her first last name, age, location, current calendar year.  She reports that she is feeling much better today.  She is still requiring oxygen  supplementation beyond her baseline of nightly.  She still feels like there is still some congestion in her lungs that she just needs to cough up or get rid of.  Objective: Vitals:   03/05/24 0545 03/05/24 0619 03/05/24 0723 03/05/24 0724  BP: 130/85   (!) 178/86  Pulse: 77   73  Resp: 15   17  Temp: 98 F (36.7 C)   98 F (36.7 C)  TempSrc:      SpO2:  98%  100% 99%  Weight:  68.6 kg    Height:        Intake/Output Summary (Last 24 hours) at 03/05/2024 1502 Last data filed at 03/05/2024 1405 Gross per 24 hour  Intake 720 ml  Output --  Net 720 ml   Filed Weights   03/03/24  0500 03/04/24 0500 03/05/24 0619  Weight: 68.7 kg 69 kg 68.6 kg    Examination:  General exam: Appears calm and comfortable  Respiratory system: Clear to auscultation. Respiratory effort normal. Cardiovascular system: S1 & S2 heard, RRR. No JVD, murmurs, rubs, gallops or clicks. No pedal edema. Gastrointestinal system: Abdomen is nondistended, soft and nontender. No organomegaly or masses felt. Normal bowel sounds heard. Central nervous system: Alert and oriented. No focal neurological deficits. Extremities: Symmetric 5 x 5 power. Skin: No rashes, lesions or ulcers Psychiatry: Judgement and insight appear normal. Mood & affect appropriate.   Data Reviewed: I have personally reviewed following labs and imaging studies  CBC: Recent Labs  Lab 03/02/24 1055 03/03/24 0514  WBC 8.1 6.4  NEUTROABS 4.1  --   HGB 12.7 11.2*  HCT 40.1 35.4*  MCV 87.7 86.3  PLT 221 203   Basic Metabolic Panel: Recent Labs  Lab 03/02/24 1055 03/02/24 1348 03/03/24 0514  NA 138  --  134*  K 4.1  --  4.0  CL 96*  --  99  CO2 33*  --  29  GLUCOSE 92  --  113*  BUN 12  --  14  CREATININE 0.72  --  0.72  CALCIUM  9.1  --  8.7*  MG  --  2.2  --    GFR: Estimated Creatinine Clearance: 64.5 mL/min (by C-G formula based on SCr of 0.72 mg/dL).  Liver Function Tests: Recent Labs  Lab 03/02/24 1055  AST 22  ALT 13  ALKPHOS 83  BILITOT 0.4  PROT 8.1  ALBUMIN 4.0   CBG: Recent Labs  Lab 03/03/24 0743 03/04/24 0927 03/04/24 1156 03/05/24 0724  GLUCAP 98 137* 152* 81   Recent Results (from the past 240 hours)  Resp panel by RT-PCR (RSV, Flu A&B, Covid) Anterior Nasal Swab     Status: None   Collection Time: 03/02/24 10:53 AM   Specimen: Anterior Nasal Swab  Result Value Ref Range Status   SARS Coronavirus 2 by RT PCR NEGATIVE NEGATIVE Final    Comment: (NOTE) SARS-CoV-2 target nucleic acids are NOT DETECTED.  The SARS-CoV-2 RNA is generally detectable in upper respiratory specimens  during the acute phase of infection. The lowest concentration of SARS-CoV-2 viral copies this assay can detect is 138 copies/mL. A negative result does not preclude SARS-Cov-2 infection and should not be used as the sole basis for treatment or other patient management decisions. A negative result may occur with  improper specimen collection/handling, submission of specimen other than nasopharyngeal swab, presence of viral mutation(s) within the areas targeted by this assay, and inadequate number of viral copies(<138 copies/mL). A negative result must be combined with clinical observations, patient history, and epidemiological information. The expected result is Negative.  Fact Sheet for Patients:  bloggercourse.com  Fact Sheet for Healthcare Providers:  seriousbroker.it  This test is no t yet approved or cleared by the [place]  FDA and  has been authorized for detection and/or diagnosis of SARS-CoV-2 by FDA under an Emergency Use Authorization (EUA). This EUA will remain  in effect (meaning this test can be used) for the duration of the COVID-19 declaration under  Section 564(b)(1) of the Act, 21 U.S.C.section 360bbb-3(b)(1), unless the authorization is terminated  or revoked sooner.       Influenza A by PCR NEGATIVE NEGATIVE Final   Influenza B by PCR NEGATIVE NEGATIVE Final    Comment: (NOTE) The Xpert Xpress SARS-CoV-2/FLU/RSV plus assay is intended as an aid in the diagnosis of influenza from Nasopharyngeal swab specimens and should not be used as a sole basis for treatment. Nasal washings and aspirates are unacceptable for Xpert Xpress SARS-CoV-2/FLU/RSV testing.  Fact Sheet for Patients: bloggercourse.com  Fact Sheet for Healthcare Providers: seriousbroker.it  This test is not yet approved or cleared by the [place]  FDA and has been authorized for detection  and/or diagnosis of SARS-CoV-2 by FDA under an Emergency Use Authorization (EUA). This EUA will remain in effect (meaning this test can be used) for the duration of the COVID-19 declaration under Section 564(b)(1) of the Act, 21 U.S.C. section 360bbb-3(b)(1), unless the authorization is terminated or revoked.     Resp Syncytial Virus by PCR NEGATIVE NEGATIVE Final    Comment: (NOTE) Fact Sheet for Patients: bloggercourse.com  Fact Sheet for Healthcare Providers: seriousbroker.it  This test is not yet approved or cleared by the [place]  FDA and has been authorized for detection and/or diagnosis of SARS-CoV-2 by FDA under an Emergency Use Authorization (EUA). This EUA will remain in effect (meaning this test can be used) for the duration of the COVID-19 declaration under Section 564(b)(1) of the Act, 21 U.S.C. section 360bbb-3(b)(1), unless the authorization is terminated or revoked.  Performed at [facility], [address] [place], [place] [address]   Respiratory (~20 pathogens) panel by PCR     Status: Abnormal   Collection Time: 03/03/24 12:52 PM   Specimen: Nasopharyngeal Swab; Respiratory  Result Value Ref Range Status   Adenovirus NOT DETECTED NOT DETECTED Final   Coronavirus 229E NOT DETECTED NOT DETECTED Final    Comment: (NOTE) The Coronavirus on the Respiratory Panel, DOES NOT test for the novel  Coronavirus (2019 nCoV)    Coronavirus HKU1 NOT DETECTED NOT DETECTED Final   Coronavirus NL63 NOT DETECTED NOT DETECTED Final   Coronavirus OC43 NOT DETECTED NOT DETECTED Final   Metapneumovirus NOT DETECTED NOT DETECTED Final   Rhinovirus / Enterovirus DETECTED (A) NOT DETECTED Final   Influenza A NOT DETECTED NOT DETECTED Final   Influenza B NOT DETECTED NOT DETECTED Final   Parainfluenza Virus 1 NOT DETECTED NOT DETECTED Final   Parainfluenza Virus 2 NOT DETECTED NOT DETECTED Final   Parainfluenza  Virus 3 NOT DETECTED NOT DETECTED Final   Parainfluenza Virus 4 NOT DETECTED NOT DETECTED Final   Respiratory Syncytial Virus NOT DETECTED NOT DETECTED Final   Bordetella pertussis NOT DETECTED NOT DETECTED Final   Bordetella Parapertussis NOT DETECTED NOT DETECTED Final   Chlamydophila pneumoniae NOT DETECTED NOT DETECTED Final   Mycoplasma pneumoniae NOT DETECTED NOT DETECTED Final    Comment: Performed at [facility], [address] [place], [place] [address]    Radiology Studies: No results found.  Scheduled Meds:  acetylcysteine  3 mL Nebulization TID   azithromycin   250 mg Oral Daily   budesonide  (PULMICORT ) nebulizer solution  0.5 mg Nebulization BID   enoxaparin  (LOVENOX ) injection  40 mg Subcutaneous Q24H   gabapentin   400 mg Oral TID   guaiFENesin   1,200 mg Oral BID   hydrOXYzine   25 mg Oral QHS   ipratropium-albuterol   3 mL Nebulization Q6H   levothyroxine   100 mcg Oral Q0600   losartan  50 mg Oral Daily   montelukast   10 mg Oral Daily   predniSONE   40 mg Oral Q breakfast   QUEtiapine   200 mg Oral QHS   sodium chloride  flush  3 mL Intravenous Q12H    LOS: 3 days   Time spent: 35 minutes  Dr. Sherre [facility]  If 7PM-7AM, please contact night-coverage  03/05/2024, 3:02 PM

## 2024-03-05 NOTE — TOC Initial Note (Addendum)
 Transition of Care [facility]) - Initial/Assessment Note    Patient Details  Name: Audrey Campbell MRN: [id number] Date of Birth: [birth year]  [facility] CM/SW Contact:    Marinda Cooks, RN Phone Number: 03/05/2024, 2:43 PM  Clinical Narrative:                  This CM spoke with pt introduced role and completed Initial assessement. Pt A&O x 4  Pt reports living at address listed on demographic sheet with fiance.  . Pt has family support provided by .Pt uses [facility] & PCP is Dr. Marsa Officer [facility]. Pt's form of income is via her SSI pt did not provide amount .  Pt shared she does not use any DME Pt also denies having any SNF admissions prior to  this admission or HH coordinated with any agencies.  Pt confirmed her fiance will provide her dc transportation .TOC will cont to follow pt's dc planning / care coordination during hospital stay and update as applicable.        Expected Discharge Plan and Services    TBD   Prior Living Arrangements/Services    Pt  admitted from home  Activities of Daily Living   ADL Screening (condition at time of admission) Independently performs ADLs?: Yes (appropriate for developmental age) Is the patient deaf or have difficulty hearing?: No Does the patient have difficulty seeing, even when wearing glasses/contacts?: No Does the patient have difficulty concentrating, remembering, or making decisions?: No   Admission diagnosis:  COPD exacerbation (HCC) [J44.1] Patient Active Problem List   Diagnosis Date Noted   Continuous dependence on cigarette smoking 03/03/2024   COPD exacerbation (HCC) 03/02/2024   Elevated hemoglobin A1c 09/26/2022   Lipoma of colon    Abdominal pain, generalized    Heartburn    Positive colorectal cancer screening using Cologuard test    Old tear of meniscus of right knee 11/09/2019   Mixed hyperlipidemia 10/06/2019   Essential hypertension 10/06/2019    Osteoarthritis of spine with radiculopathy, cervical region 10/06/2019   Chronic pain disorder 10/06/2019   Major depressive disorder, recurrent, in partial remission 07/09/2019   Psychophysiological insomnia 07/09/2019   GAD (generalized anxiety disorder) 07/09/2019   Primary osteoarthritis involving multiple joints 07/09/2019   Chronic pain of right knee 07/09/2019   Chronic bilateral low back pain without sciatica 07/09/2019   Centrilobular emphysema (HCC) 07/09/2019   Acute respiratory failure with hypoxia and hypercapnia (HCC) 12/10/2017   Obesity 07/16/2017   Hypothyroidism 07/16/2017   Former smoker 07/16/2017   Plantar fasciitis, bilateral 07/16/2017   PCP:  Officer Marsa PARAS, DO Pharmacy:   [facility] - [place], [place] - [address] [place] [address] Phone: [phone] Fax: [phone]  [facility] - [place], [place] - [address] [facility] [place] [employer]. [address] [place] [address] Phone: [phone] Fax: [phone]     Social Drivers of Health (SDOH) Social History: SDOH Screenings   Food Insecurity: No Food Insecurity (03/02/2024)  Housing: Low Risk  (03/02/2024)  Transportation Needs: No Transportation Needs (03/02/2024)  Utilities: Not At Risk (03/02/2024)  Alcohol Screen: Low Risk  (03/07/2023)  Depression (PHQ2-9): Low Risk  (12/26/2023)  Financial Resource Strain: Low Risk  (11/13/2023)   Received from [facility]  Physical Activity: Inactive (03/07/2023)  Social Connections: Moderately Isolated (03/02/2024)  Stress: No Stress Concern Present (03/07/2023)  Tobacco Use:  High Risk (03/02/2024)  Health Literacy: Adequate Health Literacy (03/07/2023)   SDOH Interventions:     Readmission Risk Interventions     No data to display

## 2024-03-05 NOTE — Assessment & Plan Note (Addendum)
 Continue home oxycodone and gabapentin   PDMP reviewed.    Patient filled oxycodone 10 mg tablet, 30-day supply, 120 tablet quantity on 02/16/2024.  Patient filled gabapentin  400 mg capsule, 90 capsules, 30-day supply on 02/10/2024.  New Rx pain medication will not be indicated given refill history.

## 2024-03-05 NOTE — Care Management Important Message (Signed)
 Important Message  Patient Details  Name: Audrey Campbell MRN: [id number] Date of Birth: 12/18/1955   Important Message Given:  Yes - Medicare IM     Sosie Gato W, CMA 03/05/2024, 2:13 PM

## 2024-03-05 NOTE — Plan of Care (Signed)

## 2024-03-05 NOTE — Assessment & Plan Note (Signed)
 Continue home quetiapine  200 mg nightly

## 2024-03-05 NOTE — Assessment & Plan Note (Addendum)
 Counseling was provided.  Patient had some pulmonary nodules which are being surveillance by CT scan. Nicotine  patch as needed Patient has not had a single cigarette in 4 days.  She reports that she has quit taking now.

## 2024-03-05 NOTE — Plan of Care (Signed)
   Problem: Safety: Goal: Ability to remain free from injury will improve Outcome: Progressing

## 2024-03-06 ENCOUNTER — Other Ambulatory Visit: Payer: Self-pay

## 2024-03-06 DIAGNOSIS — J441 Chronic obstructive pulmonary disease with (acute) exacerbation: Secondary | ICD-10-CM | POA: Diagnosis not present

## 2024-03-06 LAB — BASIC METABOLIC PANEL WITH GFR
Anion gap: 8 (ref 5–15)
BUN: 19 mg/dL (ref 8–23)
CO2: 27 mmol/L (ref 22–32)
Calcium: 8.9 mg/dL (ref 8.9–10.3)
Chloride: 102 mmol/L (ref 98–111)
Creatinine, Ser: 0.81 mg/dL (ref 0.44–1.00)
GFR, Estimated: >60 mL/min (ref >60)
Glucose, Bld: 83 mg/dL (ref 70–99)
Potassium: 4.2 mmol/L (ref 3.5–5.1)
Sodium: 137 mmol/L (ref 135–145)

## 2024-03-06 LAB — CBC
HCT: 35.7 % — ABNORMAL LOW (ref 36.0–46.0)
Hemoglobin: 11.3 g/dL — ABNORMAL LOW (ref 12.0–15.0)
MCH: 27.6 pg (ref 26.0–34.0)
MCHC: 31.7 g/dL (ref 30.0–36.0)
MCV: 87.1 fL (ref 80.0–100.0)
Platelets: 215 K/uL (ref 150–400)
RBC: 4.1 MIL/uL (ref 3.87–5.11)
RDW: 15.7 % — ABNORMAL HIGH (ref 11.5–15.5)
WBC: 8.4 K/uL (ref 4.0–10.5)
nRBC: 0 % (ref 0.0–0.2)

## 2024-03-06 LAB — GLUCOSE, CAPILLARY: Glucose-Capillary: 80 mg/dL (ref 70–99)

## 2024-03-06 MED ORDER — ACETYLCYSTEINE 10 % IN SOLN
4.0000 mL | Freq: Three times a day (TID) | RESPIRATORY_TRACT | 0 refills | Status: AC
  Filled 2024-03-09: qty 60, 5d supply, fill #0

## 2024-03-06 MED ORDER — ACETYLCYSTEINE 20 % IN SOLN
3.0000 mL | Freq: Three times a day (TID) | RESPIRATORY_TRACT | 0 refills | Status: DC
  Filled 2024-03-06: qty 30, 4d supply, fill #0

## 2024-03-06 MED ORDER — GUAIFENESIN ER 600 MG PO TB12
1200.0000 mg | ORAL_TABLET | Freq: Two times a day (BID) | ORAL | 0 refills | Status: AC
  Filled 2024-03-06: qty 16, 4d supply, fill #0

## 2024-03-06 MED ORDER — LOSARTAN POTASSIUM 50 MG PO TABS
50.0000 mg | ORAL_TABLET | Freq: Every day | ORAL | 0 refills | Status: DC
  Filled 2024-03-06: qty 30, 30d supply, fill #0

## 2024-03-06 MED ORDER — BUDESONIDE 0.5 MG/2ML IN SUSP
0.5000 mg | Freq: Two times a day (BID) | RESPIRATORY_TRACT | 0 refills | Status: DC
  Filled 2024-03-06: qty 120, 30d supply, fill #0

## 2024-03-06 MED ORDER — AZITHROMYCIN 250 MG PO TABS
250.0000 mg | ORAL_TABLET | Freq: Every day | ORAL | 0 refills | Status: AC
  Filled 2024-03-06: qty 2, 2d supply, fill #0

## 2024-03-06 NOTE — Discharge Summary (Signed)
 Physician Discharge Summary   Patient: Audrey Campbell MRN: [id number] DOB: 19-Feb-1956  Admit date:     03/02/2024  Discharge date: 03/06/24  Discharge Physician: Dr. Sherre   PCP: Edman Marsa PARAS, DO   Recommendations at discharge:   Complete Z-Pak as prescribed. Continue Mucomyst 3 times daily and DuoNebs as needed Quit smoking.  Quit smoking.  Quit smoking. Wear oxygen  as needed and nightly per  Discharge Diagnoses: Principal Problem:   COPD exacerbation (HCC) Active Problems:   Acute respiratory failure with hypoxia and hypercapnia (HCC)   Hypothyroidism   Chronic pain disorder   Major depressive disorder, recurrent, in partial remission   GAD (generalized anxiety disorder)   Essential hypertension   Continuous dependence on cigarette smoking  Resolved Problems:   * No resolved hospital problems. [place] [facility] Course: Partly taken from H&P.  Audrey Campbell is a 68 y.o. year old female with past medical history of hypertension, hyperlipidemia, COPD presenting to the ED with worsening dyspnea with URI symptoms for the last 6 days.  Denies any fever or chills.  Patient has sleep apnea and uses oxygen  at night.  On presentation hemodynamically stable, labs mostly unremarkable except mild hypercarbia.  Chest x-ray without any acute abnormality but did show chronic bilateral middle lobe opacities.  Patient received Solu-Medrol  and DuoNeb in ED and was admitted for COPD exacerbation.  She was placed on BiPAP transiently and able to wean off quickly  11/5: Blood pressure elevated at 188/91, nonsignificant mild hyponatremia at 134, CO2 improved to 29.  Started on losartan 50 mg daily.  11/6: Blood pressure still mildly elevated but much improved, still wheezing and needing oxygen .  Patient might need to go home with oxygen  as she was on 2 L of oxygen  at night before coming to the hospital.  RVP positive for rhinovirus.  11/7: Azithromycin  250 mg p.o. initiated today to  complete a 5-day course.  11/8: Patient received azithromycin  500 mg p.o. on 11/6.  Azithromycin  to 50 mg p.o. was initiated on 11/7 and patient received dose on 11/8.  Patient will be discharged home with Rx prescription to complete a 5-day course.  Mucomyst and nebulizer has been prescribed.  Assessment and Plan:  * COPD exacerbation [facility]) Patient admitted with concern of COPD exacerbation, still having significant shortness of breath and wheezing.  Chest x-ray negative for any acute abnormality. Continue with Zithromax  Continue with prednisone  Increasing the frequency of DuoNeb Continue with supportive care  DuoNeb and Mucomyst prescribed on discharge.  DME for nebulizer machine ordered on discharge.  Acute respiratory failure with hypoxia and hypercapnia (HCC) Initial VBG with concern of hypercarbia and patient required BiPAP initially due to increased work of breathing.  Uses oxygen  just at night at baseline. Currently on 2 L of oxygen . Continue supplemental oxygen -wean as tolerated  Chronic pain disorder Continue home oxycodone and gabapentin   PDMP reviewed.    Patient filled oxycodone 10 mg tablet, 30-day supply, 120 tablet quantity on 02/16/2024.  Patient filled gabapentin  400 mg capsule, 90 capsules, 30-day supply on 02/10/2024.  New Rx pain medication will not be indicated given refill history.  Hypothyroidism Levothyroxine  100 mcg daily  Continuous dependence on cigarette smoking Counseling was provided.  Patient had some pulmonary nodules which are being surveillance by CT scan. Nicotine  patch as needed Patient has not had a single cigarette in 4 days.  She reports that she has quit taking now.  Essential hypertension Blood pressure mildly elevated.  Patient was not on  any antihypertensives at home. Started on losartan-continue at current dose, she will need titration which can be done by PCP As needed hydralazine   GAD (generalized anxiety disorder) Continue  home quetiapine  200 mg nightly  Pain control - [place]  Controlled Substance Reporting System database was reviewed. and patient was instructed, not to drive, operate heavy machinery, perform activities at heights, swimming or participation in water activities or provide baby-sitting services while on Pain, Sleep and Anxiety Medications; until their outpatient Physician has advised to do so again. Also recommended to not to take more than prescribed Pain, Sleep and Anxiety Medications.  Consultants: PT, OT, TOC Procedures performed: None indicated Disposition: Home Diet recommendation:  Discharge Diet Orders (From admission, onward)     Start     Ordered   03/06/24 0000  Diet - low sodium heart healthy        03/06/24 1502           Cardiac diet DISCHARGE MEDICATION: Allergies as of 03/06/2024       Reactions   Sulfa Antibiotics    Reports had reaction ~20 years ago   Penicillins Rash, Other (See Comments)   Has patient had a PCN reaction causing immediate rash, facial/tongue/throat swelling, SOB or lightheadedness with hypotension: No Has patient had a PCN reaction causing severe rash involving mucus membranes or skin necrosis: No Has patient had a PCN reaction that required hospitalization: No Has patient had a PCN reaction occurring within the last 10 years: Yes If all of the above answers are NO, then may proceed with Cephalosporin use.        Medication List     PAUSE taking these medications    butalbital -acetaminophen -caffeine  50-325-40 MG tablet Wait to take this until your doctor or other care provider tells you to start again. Commonly known as: FIORICET TAKE 1 TABLET BY MOUTH EVERY 6 HOURS AS NEEDED FOR HEADACHE       STOP taking these medications    diclofenac  Sodium 1 % Gel Commonly known as: VOLTAREN    Oxycodone HCl 10 MG Tabs   rOPINIRole  0.5 MG tablet Commonly known as: REQUIP        TAKE these medications    acetylcysteine 20 %  nebulizer solution Commonly known as: MUCOMYST Take 3 mLs by nebulization 3 (three) times daily.   albuterol  108 (90 Base) MCG/ACT inhaler Commonly known as: VENTOLIN  HFA INHALE 2 PUFFS INTO THE LUNGS EVERY 4 HOURS AS NEEDED FOR WHEEZING OR SHORTNESS OF BREATH   albuterol  (2.5 MG/3ML) 0.083% nebulizer solution Commonly known as: PROVENTIL  USE 1 VIAL BY NEBULIZATION EVERY 4 HOURSAS NEEDED FOR WHEEZING OR SHORTNESS OF BREATH   azithromycin  250 MG tablet Commonly known as: ZITHROMAX  Take 1 tablet (250 mg total) by mouth daily for 2 days to complete course. Start taking on: March 07, 2024   Breztri  Aerosphere 160-9-4.8 MCG/ACT Aero inhaler Generic drug: budesonide -glycopyrrolate -formoterol INHALE 2 PUFFS BY MOUTH INTO THE LUNGS IN THE MORNING AND AT BEDTIME   budesonide  0.5 MG/2ML nebulizer solution Commonly known as: PULMICORT  Take 2 mLs (0.5 mg total) by nebulization 2 (two) times daily.   gabapentin  400 MG capsule Commonly known as: NEURONTIN  Take 1 capsule (400 mg total) by mouth 3 (three) times daily.   guaiFENesin  600 MG 12 hr tablet Commonly known as: MUCINEX  Take 2 tablets (1,200 mg total) by mouth 2 (two) times daily for 4 days.   hydrOXYzine  25 MG capsule Commonly known as: VISTARIL  Take 1 capsule (25 mg total) by mouth at  bedtime.   levothyroxine  100 MCG tablet Commonly known as: SYNTHROID  TAKE 1 TABLET BY MOUTH DAILY BEFORE BREAKFAST   losartan 50 MG tablet Commonly known as: COZAAR Take 1 tablet (50 mg total) by mouth daily. Start taking on: March 07, 2024   montelukast  10 MG tablet Commonly known as: SINGULAIR  TAKE 1 TABLET BY MOUTH DAILY   polyethylene glycol 17 g packet Commonly known as: MIRALAX / GLYCOLAX Take 17 g by mouth daily as needed.   QUEtiapine  100 MG tablet Commonly known as: SEROQUEL  TAKE 2 TABLETS BY MOUTH AT BEDTIME   Sodium Fluoride 5000 PPM 1.1 % Pste Generic drug: Sodium Fluoride Take 1 Application by mouth daily.                Durable Medical Equipment  (From admission, onward)           Start     Ordered   03/06/24 1503  For home use only DME Nebulizer machine  Once       Question Answer Comment  Patient needs a nebulizer to treat with the following condition COPD exacerbation (HCC)   Length of Need 6 Months   Additional equipment included Administration kit      03/06/24 1502   03/06/24 1022  For home use only DME oxygen   Once       Question Answer Comment  Length of Need 6 Months   Mode or (Route) Nasal cannula   Frequency Continuous (stationary and portable oxygen  unit needed)   Oxygen  conserving device No   Oxygen  delivery system: Gas      03/06/24 1021           Discharge Exam: Filed Weights   03/03/24 0500 03/04/24 0500 03/05/24 0619  Weight: 68.7 kg 69 kg 68.6 kg   Physical Exam Constitutional:      Appearance: She is well-developed.  HENT:     Head: Normocephalic and atraumatic.  Eyes:     Extraocular Movements: Extraocular movements intact.     Pupils: Pupils are equal, round, and reactive to light.  Cardiovascular:     Rate and Rhythm: Normal rate and regular rhythm.     Heart sounds: No murmur heard. Pulmonary:     Effort: Pulmonary effort is normal.     Breath sounds: Normal breath sounds. No decreased breath sounds or wheezing.     Comments: Nasal cannula in place.  No increased work of breathing when patient wears nasal cannula. Chest:     Chest wall: No mass, deformity or tenderness.  Abdominal:     General: Bowel sounds are normal.     Palpations: Abdomen is soft.  Musculoskeletal:        General: Normal range of motion.     Cervical back: Normal range of motion.     Right lower leg: No edema.     Left lower leg: No edema.  Skin:    General: Skin is warm and dry.     Capillary Refill: Capillary refill takes less than 2 seconds.  Neurological:     General: No focal deficit present.     Mental Status: She is alert and oriented to person,  place, and time.  Psychiatric:        Mood and Affect: Mood normal.        Behavior: Behavior normal.   Condition at discharge: good  The results of significant diagnostics from this hospitalization (including imaging, microbiology, ancillary and laboratory) are listed below for reference.  Imaging Studies: DG Chest 2 View Result Date: 03/02/2024 EXAM: 2 VIEW(S) XRAY OF THE CHEST 03/02/2024 11:04:00 AM COMPARISON: Chest CT 12/30/2023 and earlier. CLINICAL HISTORY: 68 year old female. Cough. FINDINGS: LUNGS AND PLEURA: Lung volumes are stable. Coarse bilateral middle lobe opacity appears chronic and stable. No acute lung opacity. No pulmonary edema. No pleural effusion. No pneumothorax. HEART AND MEDIASTINUM: Mediastinal contours are stable. No acute abnormality of the cardiac silhouette. BONES AND SOFT TISSUES: Probable external artifact projecting over the thoracolumbar junction on the lateral view. No acute osseous abnormality. IMPRESSION: 1. No acute cardiopulmonary abnormality. 2. Chronic middle lobe lung disease. Electronically signed by: Helayne Hurst MD 03/02/2024 12:14 PM EST RP Workstation: [id number]    Microbiology: Results for orders placed or performed during the hospital encounter of 03/02/24  Resp panel by RT-PCR (RSV, Flu A&B, Covid) Anterior Nasal Swab     Status: None   Collection Time: 03/02/24 10:53 AM   Specimen: Anterior Nasal Swab  Result Value Ref Range Status   SARS Coronavirus 2 by RT PCR NEGATIVE NEGATIVE Final    Comment: (NOTE) SARS-CoV-2 target nucleic acids are NOT DETECTED.  The SARS-CoV-2 RNA is generally detectable in upper respiratory specimens during the acute phase of infection. The lowest concentration of SARS-CoV-2 viral copies this assay can detect is 138 copies/mL. A negative result does not preclude SARS-Cov-2 infection and should not be used as the sole basis for treatment or other patient management decisions. A negative result may occur with   improper specimen collection/handling, submission of specimen other than nasopharyngeal swab, presence of viral mutation(s) within the areas targeted by this assay, and inadequate number of viral copies(<138 copies/mL). A negative result must be combined with clinical observations, patient history, and epidemiological information. The expected result is Negative.  Fact Sheet for Patients:  bloggercourse.com  Fact Sheet for Healthcare Providers:  seriousbroker.it  This test is no t yet approved or cleared by the [place]  FDA and  has been authorized for detection and/or diagnosis of SARS-CoV-2 by FDA under an Emergency Use Authorization (EUA). This EUA will remain  in effect (meaning this test can be used) for the duration of the COVID-19 declaration under Section 564(b)(1) of the Act, 21 U.S.C.section 360bbb-3(b)(1), unless the authorization is terminated  or revoked sooner.       Influenza A by PCR NEGATIVE NEGATIVE Final   Influenza B by PCR NEGATIVE NEGATIVE Final    Comment: (NOTE) The Xpert Xpress SARS-CoV-2/FLU/RSV plus assay is intended as an aid in the diagnosis of influenza from Nasopharyngeal swab specimens and should not be used as a sole basis for treatment. Nasal washings and aspirates are unacceptable for Xpert Xpress SARS-CoV-2/FLU/RSV testing.  Fact Sheet for Patients: bloggercourse.com  Fact Sheet for Healthcare Providers: seriousbroker.it  This test is not yet approved or cleared by the [place]  FDA and has been authorized for detection and/or diagnosis of SARS-CoV-2 by FDA under an Emergency Use Authorization (EUA). This EUA will remain in effect (meaning this test can be used) for the duration of the COVID-19 declaration under Section 564(b)(1) of the Act, 21 U.S.C. section 360bbb-3(b)(1), unless the authorization is terminated or revoked.      Resp Syncytial Virus by PCR NEGATIVE NEGATIVE Final    Comment: (NOTE) Fact Sheet for Patients: bloggercourse.com  Fact Sheet for Healthcare Providers: seriousbroker.it  This test is not yet approved or cleared by the [place]  FDA and has been authorized for detection and/or diagnosis of SARS-CoV-2 by FDA under  an Emergency Use Authorization (EUA). This EUA will remain in effect (meaning this test can be used) for the duration of the COVID-19 declaration under Section 564(b)(1) of the Act, 21 U.S.C. section 360bbb-3(b)(1), unless the authorization is terminated or revoked.  Performed at [facility], [address] [place], [place] [address]   Respiratory (~20 pathogens) panel by PCR     Status: Abnormal   Collection Time: 03/03/24 12:52 PM   Specimen: Nasopharyngeal Swab; Respiratory  Result Value Ref Range Status   Adenovirus NOT DETECTED NOT DETECTED Final   Coronavirus 229E NOT DETECTED NOT DETECTED Final    Comment: (NOTE) The Coronavirus on the Respiratory Panel, DOES NOT test for the novel  Coronavirus (2019 nCoV)    Coronavirus HKU1 NOT DETECTED NOT DETECTED Final   Coronavirus NL63 NOT DETECTED NOT DETECTED Final   Coronavirus OC43 NOT DETECTED NOT DETECTED Final   Metapneumovirus NOT DETECTED NOT DETECTED Final   Rhinovirus / Enterovirus DETECTED (A) NOT DETECTED Final   Influenza A NOT DETECTED NOT DETECTED Final   Influenza B NOT DETECTED NOT DETECTED Final   Parainfluenza Virus 1 NOT DETECTED NOT DETECTED Final   Parainfluenza Virus 2 NOT DETECTED NOT DETECTED Final   Parainfluenza Virus 3 NOT DETECTED NOT DETECTED Final   Parainfluenza Virus 4 NOT DETECTED NOT DETECTED Final   Respiratory Syncytial Virus NOT DETECTED NOT DETECTED Final   Bordetella pertussis NOT DETECTED NOT DETECTED Final   Bordetella Parapertussis NOT DETECTED NOT DETECTED Final   Chlamydophila pneumoniae NOT DETECTED NOT  DETECTED Final   Mycoplasma pneumoniae NOT DETECTED NOT DETECTED Final    Comment: Performed at [facility], [address], [place], [place] [address]   Labs: CBC: Recent Labs  Lab 03/02/24 1055 03/03/24 0514 03/06/24 0638  WBC 8.1 6.4 8.4  NEUTROABS 4.1  --   --   HGB 12.7 11.2* 11.3*  HCT 40.1 35.4* 35.7*  MCV 87.7 86.3 87.1  PLT 221 203 215   Basic Metabolic Panel: Recent Labs  Lab 03/02/24 1055 03/02/24 1348 03/03/24 0514 03/06/24 0638  NA 138  --  134* 137  K 4.1  --  4.0 4.2  CL 96*  --  99 102  CO2 33*  --  29 27  GLUCOSE 92  --  113* 83  BUN 12  --  14 19  CREATININE 0.72  --  0.72 0.81  CALCIUM  9.1  --  8.7* 8.9  MG  --  2.2  --   --    Liver Function Tests: Recent Labs  Lab 03/02/24 1055  AST 22  ALT 13  ALKPHOS 83  BILITOT 0.4  PROT 8.1  ALBUMIN 4.0   CBG: Recent Labs  Lab 03/03/24 0743 03/04/24 0927 03/04/24 1156 03/05/24 0724 03/06/24 0723  GLUCAP 98 137* 152* 81 80   Discharge time spent: greater than 30 minutes.  Signed: Dr. Sherre [facility] 03/06/2024

## 2024-03-06 NOTE — Plan of Care (Signed)
  Problem: Education: Goal: Knowledge of General Education information will improve Description: Including pain rating scale, medication(s)/side effects and non-pharmacologic comfort measures Outcome: Adequate for Discharge   Problem: Health Behavior/Discharge Planning: Goal: Ability to manage health-related needs will improve Outcome: Adequate for Discharge   Problem: Clinical Measurements: Goal: Ability to maintain clinical measurements within normal limits will improve Outcome: Adequate for Discharge Goal: Will remain free from infection Outcome: Adequate for Discharge Goal: Diagnostic test results will improve Outcome: Adequate for Discharge Goal: Cardiovascular complication will be avoided Outcome: Adequate for Discharge   Problem: Activity: Goal: Risk for activity intolerance will decrease Outcome: Adequate for Discharge

## 2024-03-06 NOTE — Plan of Care (Signed)
  Problem: Education: Goal: Knowledge of General Education information will improve Description: Including pain rating scale, medication(s)/side effects and non-pharmacologic comfort measures Outcome: Progressing   Problem: Health Behavior/Discharge Planning: Goal: Ability to manage health-related needs will improve Outcome: Progressing   Problem: Clinical Measurements: Goal: Ability to maintain clinical measurements within normal limits will improve Outcome: Progressing Goal: Respiratory complications will improve Outcome: Progressing Goal: Cardiovascular complication will be avoided Outcome: Progressing   Problem: Safety: Goal: Ability to remain free from injury will improve Outcome: Progressing

## 2024-03-06 NOTE — TOC Progression Note (Addendum)
 Transition of Care [facility]) - Progression Note    Patient Details  Name: Audrey Campbell MRN: [id number] Date of Birth: 04-28-1956  Transition of Care [facility]) CM/SW Contact  Marinda Cooks, RN Phone Number: 03/06/2024, 12:52 PM  Clinical Narrative:     This CM alerted by MD pt has new Oxygen  order . This CM spoke with pt regarding this order provided choice and was informed by pt he's like her oxygen  referral to remain with [facility] that she has prior oxygen  coordinated . This CM sent referral to [facility] and spoke with intake point of contact  Vernell to coordinate new oxygen  referral . This CM informed referral process has been initiated and that the Driver assigned is Alm and he will reach out to this CM to confirm delivery time .     14:24 pm- This CM rec'd call from [facility] Delivery driver informing he was headed to  hospital to deliver pt's oxygen  to rm. This CM provided Driver pt's phone number per his request and Informed pt driver would be contacting  her .    15:42 pm- This CM spoke with pt regarding new order for Nebulizer and offered choice pt confirmed she's like to coordinate her nebulizer with [facility] also.   16:05pm This  CM called and spoke with [facility] Delivery Driver Alm at [phone] and confirmed he was in route to [facility] to deliver pt's oxygen   within the hour . This CM also alerted Alm of pt's new nebulizer order and sent order over In hub also.  Expected Discharge Plan and Services    To return Home with new Oxygen  order for PRN needs    Social Drivers of Health (SDOH) Interventions SDOH Screenings   Food Insecurity: No Food Insecurity (03/02/2024)  Housing: Low Risk  (03/02/2024)  Transportation Needs: No Transportation Needs (03/02/2024)  Utilities: Not At Risk (03/02/2024)  Alcohol Screen: Low Risk  (03/07/2023)  Depression (PHQ2-9): Low Risk  (12/26/2023)  Financial Resource Strain: Low Risk  (11/13/2023)   Received from [facility]   Physical Activity: Inactive (03/07/2023)  Social Connections: Moderately Isolated (03/02/2024)  Stress: No Stress Concern Present (03/07/2023)  Tobacco Use: High Risk (03/02/2024)  Health Literacy: Adequate Health Literacy (03/07/2023)    Readmission Risk Interventions     No data to display

## 2024-03-06 NOTE — Progress Notes (Signed)
 SATURATION QUALIFICATIONS: (This note is used to comply with regulatory documentation for home oxygen )  Patient Saturations on Room Air at Rest = 92-93%  Patient Saturations on Room Air while Ambulating = 83-85%  Patient Saturations on 2 Liters of oxygen  while Ambulating = 94%

## 2024-03-08 ENCOUNTER — Telehealth: Payer: Self-pay

## 2024-03-08 NOTE — Transitions of Care (Post Inpatient/ED Visit) (Signed)
   03/08/2024  Name: ADAIJAH ENDRES MRN: [id number] DOB: 08/15/1955  Today's TOC FU Call Status: Today's TOC FU Call Status:: Unsuccessful Call (1st Attempt) Unsuccessful Call (1st Attempt) Date: 03/08/24  Attempted to reach the patient regarding the most recent Inpatient/ED visit.  Follow Up Plan: Additional outreach attempts will be made to reach the patient to complete the Transitions of Care (Post Inpatient/ED visit) call.   Medford Balboa, BSN, RN [place]  [facility]BC[facility] - [employer] [facility] RN Care Manager [phone]

## 2024-03-09 ENCOUNTER — Other Ambulatory Visit: Payer: Self-pay

## 2024-03-09 ENCOUNTER — Telehealth: Payer: Self-pay

## 2024-03-09 NOTE — Transitions of Care (Post Inpatient/ED Visit) (Signed)
   03/09/2024  Name: RIVERLYN KIZZIAH MRN: [id number] DOB: Jan 19, 1956  Today's TOC FU Call Status: Today's TOC FU Call Status:: Unsuccessful Call (2nd Attempt) Unsuccessful Call (1st Attempt) Date: 03/08/24 Unsuccessful Call (2nd Attempt) Date: 03/09/24  Attempted to reach the patient regarding the most recent Inpatient/ED visit.  Follow Up Plan: Additional outreach attempts will be made to reach the patient to complete the Transitions of Care (Post Inpatient/ED visit) call.   Medford Balboa, BSN, RN [place]  VBCI - [employer] [facility] RN Care Manager [phone]

## 2024-03-10 ENCOUNTER — Other Ambulatory Visit: Payer: Self-pay

## 2024-03-12 ENCOUNTER — Ambulatory Visit: Payer: Self-pay

## 2024-03-12 VITALS — BP 134/72 | Ht 64.5 in | Wt 151.4 lb

## 2024-03-12 DIAGNOSIS — Z0000 Encounter for general adult medical examination without abnormal findings: Secondary | ICD-10-CM | POA: Diagnosis not present

## 2024-03-12 DIAGNOSIS — Z1211 Encounter for screening for malignant neoplasm of colon: Secondary | ICD-10-CM

## 2024-03-12 NOTE — Progress Notes (Signed)
 No chief complaint on file.    Subjective:   GIABELLA Campbell is a 68 y.o. female who presents for a Medicare Annual Wellness Visit.  Allergies (verified) Sulfa antibiotics and Penicillins   History: Past Medical History:  Diagnosis Date   Anxiety    Arthritis    Chronic pain syndrome    COPD (chronic obstructive pulmonary disease) (HCC)    Hyperlipidemia    Hypertension    Hypothyroidism    On home oxygen  therapy    Plantar fasciitis    Past Surgical History:  Procedure Laterality Date   bulging disc in the neck     CATARACT EXTRACTION W/PHACO Right 11/25/2022   Procedure: CATARACT EXTRACTION PHACO AND INTRAOCULAR LENS PLACEMENT (IOC) RIGHT  11.84  01:01.5;  Surgeon: Myrna Adine Anes, MD;  Location: [facility];  Service: Ophthalmology;  Laterality: Right;   CATARACT EXTRACTION W/PHACO Left 01/06/2023   Procedure: CATARACT EXTRACTION PHACO AND INTRAOCULAR LENS PLACEMENT (IOC) LEFT  6.57  00:42.8;  Surgeon: Myrna Adine Anes, MD;  Location: [facility];  Service: Ophthalmology;  Laterality: Left;   COLONOSCOPY WITH PROPOFOL  N/A 12/29/2019   Procedure: COLONOSCOPY WITH PROPOFOL ;  Surgeon: Janalyn Keene NOVAK, MD;  Location: [facility];  Service: Endoscopy;  Laterality: N/A;   COLONOSCOPY WITH PROPOFOL  N/A 01/24/2021   Procedure: COLONOSCOPY WITH PROPOFOL ;  Surgeon: Janalyn Keene NOVAK, MD;  Location: [facility];  Service: Endoscopy;  Laterality: N/A;   DILATION AND CURETTAGE OF UTERUS     ESOPHAGOGASTRODUODENOSCOPY (EGD) WITH PROPOFOL  N/A 12/29/2019   Procedure: ESOPHAGOGASTRODUODENOSCOPY (EGD) WITH PROPOFOL ;  Surgeon: Janalyn Keene NOVAK, MD;  Location: [facility];  Service: Endoscopy;  Laterality: N/A;   FOOT SURGERY Bilateral    heel  fasciitis     RHINOPLASTY     SHOULDER SURGERY     TUBAL LIGATION     Family History  Problem Relation Age of Onset   Diabetes Father    Stroke Father 11   Thyroid  disease Father    Seizures Sister     Breast cancer Neg Hx    Social History   Occupational History   Not on file  Tobacco Use   Smoking status: Every Day    Current packs/day: 0.75    Average packs/day: 0.8 packs/day for 51.9 years (38.9 ttl pk-yrs)    Types: Cigarettes    Start date: 04/29/1972   Smokeless tobacco: Never  Vaping Use   Vaping status: Never Used  Substance and Sexual Activity   Alcohol use: No   Drug use: Yes    Types: Marijuana   Sexual activity: Yes    Birth control/protection: None   Tobacco Counseling Ready to quit: Not Answered Counseling given: Not Answered  SDOH Screenings   Food Insecurity: No Food Insecurity (03/09/2024)  Housing: Unknown (03/09/2024)  Transportation Needs: No Transportation Needs (03/09/2024)  Utilities: Not At Risk (03/09/2024)  Alcohol Screen: Low Risk  (03/07/2023)  Depression (PHQ2-9): Low Risk  (12/26/2023)  Financial Resource Strain: Low Risk  (11/13/2023)   Received from [facility]  Physical Activity: Inactive (03/07/2023)  Social Connections: Moderately Isolated (03/02/2024)  Stress: No Stress Concern Present (03/07/2023)  Tobacco Use: High Risk (03/12/2024)  Health Literacy: Adequate Health Literacy (03/07/2023)   See flowsheets for full screening details  Depression Screen PHQ 2 & 9 Depression Scale- Over the past 2 weeks, how often have you been bothered by any of the following problems? Little interest or pleasure in doing things: 0 Feeling down, depressed,  or hopeless (PHQ Adolescent also includes...irritable): 0 PHQ-2 Total Score: 0 Trouble falling or staying asleep, or sleeping too much: 0 Feeling tired or having little energy: 0 Poor appetite or overeating (PHQ Adolescent also includes...weight loss): 2 Feeling bad about yourself - or that you are a failure or have let yourself or your family down: 0 Trouble concentrating on things, such as reading the newspaper or watching television (PHQ Adolescent also includes...like school  work): 0 Moving or speaking so slowly that other people could have noticed. Or the opposite - being so fidgety or restless that you have been moving around a lot more than usual: 0 Thoughts that you would be better off dead, or of hurting yourself in some way: 0 PHQ-9 Total Score: 2 If you checked off any problems, how difficult have these problems made it for you to do your work, take care of things at home, or get along with other people?: Not difficult at all     Goals Addressed   None    Fall Screening Falls in the past year?: 0 Was there an injury with Fall?: 0 Patient Fall Risk Level: Moderate fall risk  Fall Risk Patient at Risk for Falls Due to: No Fall Risks  Advance Directives (For Healthcare) Does Patient Have a Medical Advance Directive?: No Would patient like information on creating a medical advance directive?: No - Patient declined        Objective:    Today's Vitals   03/12/24 1032  BP: 134/72  Weight: 151 lb 6.4 oz (68.7 kg)  Height: 5' 4.5 (1.638 m)   Body mass index is 25.59 kg/m.  Current Medications (verified) Outpatient Encounter Medications as of 03/12/2024  Medication Sig   acetylcysteine (MUCOMYST) 10 % nebulizer solution Take 4 mLs by nebulization 3 (three) times daily for 5 days. (Patient not taking: Reported on 03/09/2024)   albuterol  (PROVENTIL ) (2.5 MG/3ML) 0.083% nebulizer solution USE 1 VIAL BY NEBULIZATION EVERY 4 HOURSAS NEEDED FOR WHEEZING OR SHORTNESS OF BREATH   albuterol  (VENTOLIN  HFA) 108 (90 Base) MCG/ACT inhaler INHALE 2 PUFFS INTO THE LUNGS EVERY 4 HOURS AS NEEDED FOR WHEEZING OR SHORTNESS OF BREATH   BREZTRI  AEROSPHERE 160-9-4.8 MCG/ACT AERO INHALE 2 PUFFS BY MOUTH INTO THE LUNGS IN THE MORNING AND AT BEDTIME   budesonide  (PULMICORT ) 0.5 MG/2ML nebulizer solution Take 2 mLs (0.5 mg total) by nebulization 2 (two) times daily.   [Paused] butalbital -acetaminophen -caffeine  (FIORICET) 50-325-40 MG tablet TAKE 1 TABLET BY MOUTH EVERY  6 HOURS AS NEEDED FOR HEADACHE (Patient not taking: Reported on 03/02/2024)   gabapentin  (NEURONTIN ) 400 MG capsule Take 1 capsule (400 mg total) by mouth 3 (three) times daily.   hydrOXYzine  (VISTARIL ) 25 MG capsule Take 1 capsule (25 mg total) by mouth at bedtime.   levothyroxine  (SYNTHROID ) 100 MCG tablet TAKE 1 TABLET BY MOUTH DAILY BEFORE BREAKFAST   losartan (COZAAR) 50 MG tablet Take 1 tablet (50 mg total) by mouth daily.   montelukast  (SINGULAIR ) 10 MG tablet TAKE 1 TABLET BY MOUTH DAILY   Oxycodone HCl 10 MG TABS Take 10 mg by mouth 3 (three) times daily.   OXYGEN  Inhale 2 L/min into the lungs at bedtime.   polyethylene glycol (MIRALAX / GLYCOLAX) 17 g packet Take 17 g by mouth daily as needed.   QUEtiapine  (SEROQUEL ) 100 MG tablet TAKE 2 TABLETS BY MOUTH AT BEDTIME   SODIUM FLUORIDE 5000 PPM 1.1 % PSTE Take 1 Application by mouth daily.   No facility-administered encounter medications on  file as of 03/12/2024.   Hearing/Vision screen No results found. Immunizations and Health Maintenance Health Maintenance  Topic Date Due   Zoster Vaccines- Shingrix (1 of 2) Never done   Influenza Vaccine  11/28/2023   COVID-19 Vaccine (6 - 2025-26 season) 12/29/2023   Colonoscopy  01/25/2024   Medicare Annual Wellness (AWV)  03/06/2024   Lung Cancer Screening  12/29/2024   Mammogram  04/01/2025   Pneumococcal Vaccine: 50+ Years  Completed   DEXA SCAN  Completed   Hepatitis C Screening  Completed   Meningococcal B Vaccine  Aged Out   DTaP/Tdap/Td  Discontinued   Fecal DNA (Cologuard)  Discontinued        Assessment/Plan:  This is a routine wellness examination for Loie.  Patient Care Team: Edman Marsa PARAS, DO as PCP - General (Family Medicine) Cindie Jesusa HERO, RN as Registered Nurse Dannielle Arlean FALCON, RN (Inactive) as Registered Nurse Alana, Sharyle LABOR, RPH-CPP as Pharmacist  I have personally reviewed and noted the following in the patient's chart:   Medical and  social history Use of alcohol, tobacco or illicit drugs  Current medications and supplements including opioid prescriptions. Functional ability and status Nutritional status Physical activity Advanced directives List of other physicians Hospitalizations, surgeries, and ER visits in previous 12 months Vitals Screenings to include cognitive, depression, and falls Referrals and appointments  No orders of the defined types were placed in this encounter.  In addition, I have reviewed and discussed with patient certain preventive protocols, quality metrics, and best practice recommendations. A written personalized care plan for preventive services as well as general preventive health recommendations were provided to patient.   Jhonnie GORMAN Das, LPN   [id number]   No follow-ups on file.  After Visit Summary: (In Person-Printed) AVS printed and given to the patient  Nurse Notes: UTD ON FLU & PNA & COVID- NOT SURE SHE HAD CH[employer]ENPO[employer] AS A KID; NEEDS TDAP; APPT SCHEDULED FOR MAMMOGRAM; DECLINES BDS REFERRAL; REFERRAL SENT FOR COLONOSCOPY; UTD ON LUNG CA SCREENING

## 2024-03-12 NOTE — Patient Instructions (Signed)
 Audrey Campbell,  Thank you for taking the time for your Medicare Wellness Visit. I appreciate your continued commitment to your health goals. Please review the care plan we discussed, and feel free to reach out if I can assist you further.  Please note that Annual Wellness Visits do not include a physical exam. Some assessments may be limited, especially if the visit was conducted virtually. If needed, we may recommend an in-person follow-up with your provider.  Ongoing Care Seeing your primary care provider every 3 to 6 months helps us  monitor your health and provide consistent, personalized care.   Referrals If a referral was made during today's visit and you haven't received any updates within two weeks, please contact the referred provider directly to  check on the status.  REFERRAL SENT FOR COLONOSCOPY  Recommended Screenings:  Health Maintenance  Topic Date Due   Zoster (Shingles) Vaccine (1 of 2) Never done   COVID-19 Vaccine (6 - 2025-26 season) 12/29/2023   Colon Cancer Screening  01/25/2024   Screening for Lung Cancer  12/29/2024   Medicare Annual Wellness Visit  03/12/2025   Breast Cancer Screening  04/01/2025   Pneumococcal Vaccine for age over 42  Completed   Flu Shot  Completed   DEXA scan (bone density measurement)  Completed   Hepatitis C Screening  Completed   Meningitis B Vaccine  Aged Out   DTaP/Tdap/Td vaccine  Discontinued   Cologuard (Stool DNA test)  Discontinued     Vision: Annual vision screenings are recommended for early detection of glaucoma, cataracts, and diabetic retinopathy. These exams can also reveal signs of chronic conditions such as diabetes and high blood pressure.  Dental: Annual dental screenings help detect early signs of oral cancer, gum disease, and other conditions linked to overall health, including heart disease and diabetes.  Please see the attached documents for additional preventive care recommendations.   NEXT AWV 03/16/25 @  10:50 AM IN PERSON Take care- Audrey Campbell

## 2024-03-15 ENCOUNTER — Encounter: Payer: Self-pay | Admitting: Family Medicine

## 2024-03-15 ENCOUNTER — Ambulatory Visit (INDEPENDENT_AMBULATORY_CARE_PROVIDER_SITE_OTHER): Admitting: Family Medicine

## 2024-03-15 VITALS — BP 122/70 | HR 84 | Ht 64.5 in | Wt 150.0 lb

## 2024-03-15 DIAGNOSIS — E039 Hypothyroidism, unspecified: Secondary | ICD-10-CM

## 2024-03-15 DIAGNOSIS — F5104 Psychophysiologic insomnia: Secondary | ICD-10-CM

## 2024-03-15 DIAGNOSIS — J302 Other seasonal allergic rhinitis: Secondary | ICD-10-CM

## 2024-03-15 DIAGNOSIS — J432 Centrilobular emphysema: Secondary | ICD-10-CM

## 2024-03-15 DIAGNOSIS — F3341 Major depressive disorder, recurrent, in partial remission: Secondary | ICD-10-CM

## 2024-03-15 DIAGNOSIS — I10 Essential (primary) hypertension: Secondary | ICD-10-CM

## 2024-03-15 DIAGNOSIS — G8929 Other chronic pain: Secondary | ICD-10-CM

## 2024-03-15 MED ORDER — HYDROXYZINE PAMOATE 25 MG PO CAPS: 25.0000 mg | ORAL_CAPSULE | Freq: Every day | ORAL | 1 refills | Status: DC

## 2024-03-15 MED ORDER — LEVOTHYROXINE SODIUM 100 MCG PO TABS: 100.0000 ug | ORAL_TABLET | Freq: Every day | ORAL | 1 refills | Status: DC

## 2024-03-15 MED ORDER — ALBUTEROL SULFATE HFA 108 (90 BASE) MCG/ACT IN AERS: 2.0000 | INHALATION_SPRAY | RESPIRATORY_TRACT | 0 refills | Status: DC | PRN

## 2024-03-15 MED ORDER — LOSARTAN POTASSIUM 50 MG PO TABS: 50.0000 mg | ORAL_TABLET | Freq: Every day | ORAL | 1 refills | Status: AC

## 2024-03-15 MED ORDER — MONTELUKAST SODIUM 10 MG PO TABS: 10.0000 mg | ORAL_TABLET | Freq: Every day | ORAL | 1 refills | Status: AC

## 2024-03-15 MED ORDER — QUETIAPINE FUMARATE 100 MG PO TABS: 200.0000 mg | ORAL_TABLET | Freq: Every day | ORAL | 1 refills | Status: DC

## 2024-03-15 NOTE — Progress Notes (Addendum)
 Subjective:    Patient ID: Audrey Campbell, female    DOB: 09/17/1955, 68 y.o.   MRN: [id number]  Audrey Campbell is a 68 y.o. female presenting on 03/15/2024 for Hospitalization Follow-up and COPD   HPI  Discussed the use of AI scribe software for clinical note transcription with the patient, who gave verbal consent to proceed.  History of Present Illness   Audrey Campbell is a 67 year old female who presents for a follow-up after hospitalization for elevated blood pressure and respiratory issues.   HOSPITAL FOLLOW-UP VISIT  Hospital/Location: [facility] Date of Admission: 03/02/24 Date of Discharge: 03/06/24 Transitions of care telephone call: 03/09/24 [facility] RN  Reason for Admission: COPD exacerbation  - Hospital H&P and Discharge Summary have been reviewed - Patient presents today 9 days after recent hospitalization.   Hypertension - Hospitalized from November 4 to November 8 for significantly elevated blood pressure. - Initiation of losartan during hospitalization. - Currently taking losartan with well-controlled blood pressure today.  Respiratory symptoms - Hospitalized for respiratory issues exacerbated by rhinovirus infection. - Significant difficulty breathing during hospitalization, requiring oxygen  therapy, breathing treatments, and steroids. - Antibiotics including azithromycin  administered during hospital stay. - Oxygen  therapy tapered off prior to discharge. - Continues to use a breathing machine at home. - Improvement in respiratory symptoms since discharge, but persistent wheezing remains. - No new respiratory symptoms reported.  Medication management - Requests refills for medications; not out but lacks refills. - Medications typically ordered for ninety days, except for headache medication which is taken as needed.   - Today reports overall has done well after discharge. Symptoms of shortness of breath have resolved  - New medications on  discharge: Azithromycin , Prednisone , Duoneb, Losartan - Changes to current meds on discharge: none  I have reviewed the discharge medication list, and have reconciled the current and discharge medications today.   Current Outpatient Medications:    albuterol  (PROVENTIL ) (2.5 MG/3ML) 0.083% nebulizer solution, USE 1 VIAL BY NEBULIZATION EVERY 4 HOURSAS NEEDED FOR WHEEZING OR SHORTNESS OF BREATH, Disp: 150 mL, Rfl: 3   BREZTRI  AEROSPHERE 160-9-4.8 MCG/ACT AERO, INHALE 2 PUFFS BY MOUTH INTO THE LUNGS IN THE MORNING AND AT BEDTIME, Disp: 10.7 g, Rfl: 2   budesonide  (PULMICORT ) 0.5 MG/2ML nebulizer solution, Take 2 mLs (0.5 mg total) by nebulization 2 (two) times daily., Disp: 120 mL, Rfl: 0   butalbital -acetaminophen -caffeine  (FIORICET) 50-325-40 MG tablet, TAKE 1 TABLET BY MOUTH EVERY 6 HOURS AS NEEDED FOR HEADACHE, Disp: 30 tablet, Rfl: 0   gabapentin  (NEURONTIN ) 400 MG capsule, Take 1 capsule (400 mg total) by mouth 3 (three) times daily., Disp: 270 capsule, Rfl: 3   Oxycodone HCl 10 MG TABS, Take 10 mg by mouth 3 (three) times daily., Disp: , Rfl:    OXYGEN , Inhale 2 L/min into the lungs at bedtime., Disp: , Rfl:    polyethylene glycol (MIRALAX / GLYCOLAX) 17 g packet, Take 17 g by mouth daily as needed. (Patient taking differently: Take 17 g by mouth daily as needed. TAKES EVERY MORNING W/ COFFEE), Disp: , Rfl:    SODIUM FLUORIDE 5000 PPM 1.1 % PSTE, Take 1 Application by mouth daily., Disp: , Rfl:    acetylcysteine (MUCOMYST) 10 % nebulizer solution, Take 4 mLs by nebulization 3 (three) times daily for 5 days. (Patient not taking: Reported on 03/15/2024), Disp: 60 mL, Rfl: 0   albuterol  (VENTOLIN  HFA) 108 (90 Base) MCG/ACT inhaler, Inhale 2 puffs into the lungs every 4 (four)  hours as needed for wheezing or shortness of breath., Disp: 8.5 g, Rfl: 0   hydrOXYzine  (VISTARIL ) 25 MG capsule, Take 1 capsule (25 mg total) by mouth at bedtime., Disp: 90 capsule, Rfl: 1   levothyroxine  (SYNTHROID ) 100  MCG tablet, Take 1 tablet (100 mcg total) by mouth daily before breakfast., Disp: 90 tablet, Rfl: 1   losartan (COZAAR) 50 MG tablet, Take 1 tablet (50 mg total) by mouth daily., Disp: 90 tablet, Rfl: 1   montelukast  (SINGULAIR ) 10 MG tablet, Take 1 tablet (10 mg total) by mouth daily., Disp: 90 tablet, Rfl: 1   QUEtiapine  (SEROQUEL ) 100 MG tablet, Take 2 tablets (200 mg total) by mouth at bedtime., Disp: 180 tablet, Rfl: 1     03/15/2024    3:10 PM 03/12/2024   10:44 AM 12/26/2023   10:46 AM 10/15/2023    9:38 AM 03/07/2023   10:49 AM  Depression screen PHQ 2/9  Decreased Interest 0 0 0 0 0  Down, Depressed, Hopeless 0 0 0 0 0  PHQ - 2 Score 0 0 0 0 0  Altered sleeping 1 0 0 0 0  Tired, decreased energy 0 0 0 0 0  Change in appetite 1 0 2 0 0  Feeling bad or failure about yourself  0 0 0 0 0  Trouble concentrating 0 0 0 0 0  Moving slowly or fidgety/restless 0 0 0 0 0  Suicidal thoughts 0 0 0 0 0  PHQ-9 Score 2 0 2  0  0   Difficult doing work/chores Somewhat difficult Not difficult at all Not difficult at all Not difficult at all Not difficult at all     Data saved with a previous flowsheet row definition     ------------------------------------------------------------------------- Social History   Tobacco Use   Smoking status: Every Day    Current packs/day: 0.75    Average packs/day: 0.8 packs/day for 51.9 years (38.9 ttl pk-yrs)    Types: Cigarettes    Start date: 04/29/1972   Smokeless tobacco: Never  Vaping Use   Vaping status: Never Used  Substance Use Topics   Alcohol use: No   Drug use: Yes    Types: Marijuana    Review of Systems Per HPI unless specifically indicated above     Objective:    BP 122/70 (BP Location: Left Arm, Patient Position: Sitting, Cuff Size: Normal)   Pulse 84   Ht 5' 4.5 (1.638 m)   Wt 150 lb (68 kg)   SpO2 94%   BMI 25.35 kg/m   Wt Readings from Last 3 Encounters:  03/15/24 150 lb (68 kg)  03/12/24 151 lb 6.4 oz (68.7 kg)   03/05/24 151 lb 3.8 oz (68.6 kg)    Physical Exam Vitals and nursing note reviewed.  Constitutional:      General: She is not in acute distress.    Appearance: She is well-developed. She is not diaphoretic.     Comments: Well-appearing, comfortable, cooperative  HENT:     Head: Normocephalic and atraumatic.  Eyes:     General:        Right eye: No discharge.        Left eye: No discharge.     Conjunctiva/sclera: Conjunctivae normal.  Neck:     Thyroid : No thyromegaly.  Cardiovascular:     Rate and Rhythm: Normal rate and regular rhythm.     Heart sounds: Normal heart sounds. No murmur heard. Pulmonary:     Effort: Pulmonary effort is  normal. No respiratory distress.     Breath sounds: Wheezing present. No rales.     Comments: Chronic reduced air movement some wheezing occasional cough Musculoskeletal:        General: Normal range of motion.     Cervical back: Normal range of motion and neck supple.  Lymphadenopathy:     Cervical: No cervical adenopathy.  Skin:    General: Skin is warm and dry.     Findings: No erythema or rash.  Neurological:     Mental Status: She is alert and oriented to person, place, and time.  Psychiatric:        Behavior: Behavior normal.     Comments: Well groomed, good eye contact, normal speech and thoughts       Results for orders placed or performed during the hospital encounter of 03/02/24  Resp panel by RT-PCR (RSV, Flu A&B, Covid) Anterior Nasal Swab   Collection Time: 03/02/24 10:53 AM   Specimen: Anterior Nasal Swab  Result Value Ref Range   SARS Coronavirus 2 by RT PCR NEGATIVE NEGATIVE   Influenza A by PCR NEGATIVE NEGATIVE   Influenza B by PCR NEGATIVE NEGATIVE   Resp Syncytial Virus by PCR NEGATIVE NEGATIVE  CBC with Differential   Collection Time: 03/02/24 10:55 AM  Result Value Ref Range   WBC 8.1 4.0 - 10.5 K/uL   RBC 4.57 3.87 - 5.11 MIL/uL   Hemoglobin 12.7 12.0 - 15.0 g/dL   HCT 59.8 63.9 - 53.9 %   MCV 87.7 80.0 -  100.0 fL   MCH 27.8 26.0 - 34.0 pg   MCHC 31.7 30.0 - 36.0 g/dL   RDW 84.1 (H) 88.4 - 84.4 %   Platelets 221 150 - 400 K/uL   nRBC 0.0 0.0 - 0.2 %   Neutrophils Relative % 50 %   Neutro Abs 4.1 1.7 - 7.7 K/uL   Lymphocytes Relative 41 %   Lymphs Abs 3.4 0.7 - 4.0 K/uL   Monocytes Relative 6 %   Monocytes Absolute 0.5 0.1 - 1.0 K/uL   Eosinophils Relative 2 %   Eosinophils Absolute 0.2 0.0 - 0.5 K/uL   Basophils Relative 1 %   Basophils Absolute 0.1 0.0 - 0.1 K/uL   Immature Granulocytes 0 %   Abs Immature Granulocytes 0.03 0.00 - 0.07 K/uL  Comprehensive metabolic panel   Collection Time: 03/02/24 10:55 AM  Result Value Ref Range   Sodium 138 135 - 145 mmol/L   Potassium 4.1 3.5 - 5.1 mmol/L   Chloride 96 (L) 98 - 111 mmol/L   CO2 33 (H) 22 - 32 mmol/L   Glucose, Bld 92 70 - 99 mg/dL   BUN 12 8 - 23 mg/dL   Creatinine, Ser 9.27 0.44 - 1.00 mg/dL   Calcium  9.1 8.9 - 10.3 mg/dL   Total Protein 8.1 6.5 - 8.1 g/dL   Albumin 4.0 3.5 - 5.0 g/dL   AST 22 15 - 41 U/L   ALT 13 0 - 44 U/L   Alkaline Phosphatase 83 38 - 126 U/L   Total Bilirubin 0.4 0.0 - 1.2 mg/dL   GFR, Estimated >39 >39 mL/min   Anion gap 9 5 - 15  Brain natriuretic peptide   Collection Time: 03/02/24 10:55 AM  Result Value Ref Range   B Natriuretic Peptide 61.1 0.0 - 100.0 pg/mL  Troponin I (High Sensitivity)   Collection Time: 03/02/24 10:55 AM  Result Value Ref Range   Troponin I (High Sensitivity) 6 <  18 ng/L  Blood gas, venous   Collection Time: 03/02/24 11:00 AM  Result Value Ref Range   pH, Ven 7.34 7.25 - 7.43   pCO2, Ven 75 (HH) 44 - 60 mmHg   pO2, Ven 41 32 - 45 mmHg   Bicarbonate 40.5 (H) 20.0 - 28.0 mmol/L   Acid-Base Excess 11.4 (H) 0.0 - 2.0 mmol/L   O2 Saturation 61.3 %   Patient temperature 37.0    Collection site VENOUS   Blood gas, venous   Collection Time: 03/02/24  1:48 PM  Result Value Ref Range   pH, Ven 7.45 (H) 7.25 - 7.43   pCO2, Ven 51 44 - 60 mmHg   pO2, Ven 52 (H) 32 -  45 mmHg   Bicarbonate 35.4 (H) 20.0 - 28.0 mmol/L   Acid-Base Excess 9.7 (H) 0.0 - 2.0 mmol/L   O2 Saturation 84.1 %   Patient temperature 37.0    Collection site VEIN   HIV Antibody (routine testing w rflx)   Collection Time: 03/02/24  1:48 PM  Result Value Ref Range   HIV Screen 4th Generation wRfx Non Reactive Non Reactive  Magnesium   Collection Time: 03/02/24  1:48 PM  Result Value Ref Range   Magnesium 2.2 1.7 - 2.4 mg/dL  Troponin I (High Sensitivity)   Collection Time: 03/02/24  1:48 PM  Result Value Ref Range   Troponin I (High Sensitivity) 7 <18 ng/L  Basic metabolic panel   Collection Time: 03/03/24  5:14 AM  Result Value Ref Range   Sodium 134 (L) 135 - 145 mmol/L   Potassium 4.0 3.5 - 5.1 mmol/L   Chloride 99 98 - 111 mmol/L   CO2 29 22 - 32 mmol/L   Glucose, Bld 113 (H) 70 - 99 mg/dL   BUN 14 8 - 23 mg/dL   Creatinine, Ser 9.27 0.44 - 1.00 mg/dL   Calcium  8.7 (L) 8.9 - 10.3 mg/dL   GFR, Estimated >39 >39 mL/min   Anion gap 6 5 - 15  CBC   Collection Time: 03/03/24  5:14 AM  Result Value Ref Range   WBC 6.4 4.0 - 10.5 K/uL   RBC 4.10 3.87 - 5.11 MIL/uL   Hemoglobin 11.2 (L) 12.0 - 15.0 g/dL   HCT 64.5 (L) 63.9 - 53.9 %   MCV 86.3 80.0 - 100.0 fL   MCH 27.3 26.0 - 34.0 pg   MCHC 31.6 30.0 - 36.0 g/dL   RDW 84.4 88.4 - 84.4 %   Platelets 203 150 - 400 K/uL   nRBC 0.0 0.0 - 0.2 %  Glucose, capillary   Collection Time: 03/03/24  7:43 AM  Result Value Ref Range   Glucose-Capillary 98 70 - 99 mg/dL  Respiratory (~79 pathogens) panel by PCR   Collection Time: 03/03/24 12:52 PM   Specimen: Nasopharyngeal Swab; Respiratory  Result Value Ref Range   Adenovirus NOT DETECTED NOT DETECTED   Coronavirus 229E NOT DETECTED NOT DETECTED   Coronavirus HKU1 NOT DETECTED NOT DETECTED   Coronavirus NL63 NOT DETECTED NOT DETECTED   Coronavirus OC43 NOT DETECTED NOT DETECTED   Metapneumovirus NOT DETECTED NOT DETECTED   Rhinovirus / Enterovirus DETECTED (A) NOT  DETECTED   Influenza A NOT DETECTED NOT DETECTED   Influenza B NOT DETECTED NOT DETECTED   Parainfluenza Virus 1 NOT DETECTED NOT DETECTED   Parainfluenza Virus 2 NOT DETECTED NOT DETECTED   Parainfluenza Virus 3 NOT DETECTED NOT DETECTED   Parainfluenza Virus 4 NOT DETECTED NOT  DETECTED   Respiratory Syncytial Virus NOT DETECTED NOT DETECTED   Bordetella pertussis NOT DETECTED NOT DETECTED   Bordetella Parapertussis NOT DETECTED NOT DETECTED   Chlamydophila pneumoniae NOT DETECTED NOT DETECTED   Mycoplasma pneumoniae NOT DETECTED NOT DETECTED  Glucose, capillary   Collection Time: 03/04/24  9:27 AM  Result Value Ref Range   Glucose-Capillary 137 (H) 70 - 99 mg/dL  Glucose, capillary   Collection Time: 03/04/24 11:56 AM  Result Value Ref Range   Glucose-Capillary 152 (H) 70 - 99 mg/dL  Glucose, capillary   Collection Time: 03/05/24  7:24 AM  Result Value Ref Range   Glucose-Capillary 81 70 - 99 mg/dL  CBC   Collection Time: 03/06/24  6:38 AM  Result Value Ref Range   WBC 8.4 4.0 - 10.5 K/uL   RBC 4.10 3.87 - 5.11 MIL/uL   Hemoglobin 11.3 (L) 12.0 - 15.0 g/dL   HCT 64.2 (L) 63.9 - 53.9 %   MCV 87.1 80.0 - 100.0 fL   MCH 27.6 26.0 - 34.0 pg   MCHC 31.7 30.0 - 36.0 g/dL   RDW 84.2 (H) 88.4 - 84.4 %   Platelets 215 150 - 400 K/uL   nRBC 0.0 0.0 - 0.2 %  Basic metabolic panel   Collection Time: 03/06/24  6:38 AM  Result Value Ref Range   Sodium 137 135 - 145 mmol/L   Potassium 4.2 3.5 - 5.1 mmol/L   Chloride 102 98 - 111 mmol/L   CO2 27 22 - 32 mmol/L   Glucose, Bld 83 70 - 99 mg/dL   BUN 19 8 - 23 mg/dL   Creatinine, Ser 9.18 0.44 - 1.00 mg/dL   Calcium  8.9 8.9 - 10.3 mg/dL   GFR, Estimated >39 >39 mL/min   Anion gap 8 5 - 15  Glucose, capillary   Collection Time: 03/06/24  7:23 AM  Result Value Ref Range   Glucose-Capillary 80 70 - 99 mg/dL      Assessment & Plan:   Problem List Items Addressed This Visit     Centrilobular emphysema (HCC) - Primary   Relevant  Medications   montelukast  (SINGULAIR ) 10 MG tablet   albuterol  (VENTOLIN  HFA) 108 (90 Base) MCG/ACT inhaler   Essential hypertension   Relevant Medications   losartan (COZAAR) 50 MG tablet   Hypothyroidism   Relevant Medications   levothyroxine  (SYNTHROID ) 100 MCG tablet   Major depressive disorder, recurrent, in partial remission   Relevant Medications   hydrOXYzine  (VISTARIL ) 25 MG capsule   Psychophysiological insomnia   Relevant Medications   hydrOXYzine  (VISTARIL ) 25 MG capsule   QUEtiapine  (SEROQUEL ) 100 MG tablet   Other Visit Diagnoses       Seasonal allergies       Relevant Medications   montelukast  (SINGULAIR ) 10 MG tablet     Chronic intractable headache, unspecified headache type            Transition of Care after Hospitalization for Acute Respiratory Illness  Acute COPD Exacerbation Centrilobular Emphysema  Recent hospitalization for acute respiratory illness, leading to respiratory distress and AECOPD Required antibiotics, steroid injection solu medrol  and prednisone , oxygen  supplement and duoneb Respiratory status improved but not fully resolved, with some wheezing present. Further improvement anticipated. - Continue current medications as prescribed during hospitalization. Now on oral medications finished - Monitor respiratory status and report any worsening symptoms. - Encouraged rest and allowed time for recovery.  Essential hypertension Blood pressure was elevated during hospitalization, managed with losartan. Current readings  well-controlled. Long-term need for losartan undetermined but effective currently. - Continue losartan daily 50mg  - Monitor blood pressure regularly. - Reassess blood pressure management at next follow-up.  Centrilobular emphysema Chronic condition with recent exacerbation due to respiratory illness. Respiratory status improving but not fully resolved. - Continue current respiratory management plan. On Breztri  for maintenance  therapy  Hypothyroidism Managed with levothyroxine . - Continue levothyroxine  as prescribed.  Headache Management ongoing with current medication regimen. - Continue current headache management plan. Fiorcet AS NEEDED  Major Depression recurrent partial remission Insomnia On Seroquel  add refill On Hydroxyzine  PRN        Meds ordered this encounter  Medications   hydrOXYzine  (VISTARIL ) 25 MG capsule    Sig: Take 1 capsule (25 mg total) by mouth at bedtime.    Dispense:  90 capsule    Refill:  1    Upgrade to 90 day + additional refills for future   montelukast  (SINGULAIR ) 10 MG tablet    Sig: Take 1 tablet (10 mg total) by mouth daily.    Dispense:  90 tablet    Refill:  1   levothyroxine  (SYNTHROID ) 100 MCG tablet    Sig: Take 1 tablet (100 mcg total) by mouth daily before breakfast.    Dispense:  90 tablet    Refill:  1   QUEtiapine  (SEROQUEL ) 100 MG tablet    Sig: Take 2 tablets (200 mg total) by mouth at bedtime.    Dispense:  180 tablet    Refill:  1   albuterol  (VENTOLIN  HFA) 108 (90 Base) MCG/ACT inhaler    Sig: Inhale 2 puffs into the lungs every 4 (four) hours as needed for wheezing or shortness of breath.    Dispense:  8.5 g    Refill:  0   losartan (COZAAR) 50 MG tablet    Sig: Take 1 tablet (50 mg total) by mouth daily.    Dispense:  90 tablet    Refill:  1    Follow up plan: Return if symptoms worsen or fail to improve.   Marsa Officer, DO [facility] 03/15/2024, 2:27 PM

## 2024-03-15 NOTE — Patient Instructions (Addendum)
 Thank you for coming to the office today.  Keep on current medications  Refilled Losartan for the blood pressure  Other refills sent in for 90 days + 1 refill  Lungs are improving, not 100% normal but better.  Please schedule a Follow-up Appointment to: Return if symptoms worsen or fail to improve.  If you have any other questions or concerns, please feel free to call the office or send a message through MyChart. You may also schedule an earlier appointment if necessary.  Additionally, you may be receiving a survey about your experience at our office within a few days to 1 week by e-mail or mail. We value your feedback.  Marsa Officer, DO [facility], [place]

## 2024-03-17 ENCOUNTER — Other Ambulatory Visit: Payer: Self-pay

## 2024-03-17 ENCOUNTER — Telehealth: Payer: Self-pay

## 2024-03-17 DIAGNOSIS — Z860100 Personal history of colon polyps, unspecified: Secondary | ICD-10-CM

## 2024-03-17 NOTE — Telephone Encounter (Signed)
 Gastroenterology Pre-Procedure Review  Request Date: 05/05/2024 Requesting Physician: Dr. Marinda  PATIENT REVIEW QUESTIONS: The patient responded to the following health history questions as indicated:    1. Are you having any GI issues? no 2. Do you have a personal history of Polyps? yes (Dr. Janalyn 01/24/2021) 3. Do you have a family history of Colon Cancer or Polyps? no 4. Diabetes Mellitus? no 5. Joint replacements in the past 12 months?no 6. Major health problems in the past 3 months?no 7. Any artificial heart valves, MVP, or defibrillator?no    MEDICATIONS & ALLERGIES:    Patient reports the following regarding taking any anticoagulation/antiplatelet therapy:   Plavix, Coumadin, Eliquis, Xarelto, Lovenox , Pradaxa, Brilinta, or Effient? no Aspirin? no  Patient confirms/reports the following medications:  Current Outpatient Medications  Medication Sig Dispense Refill   albuterol  (PROVENTIL ) (2.5 MG/3ML) 0.083% nebulizer solution USE 1 VIAL BY NEBULIZATION EVERY 4 HOURSAS NEEDED FOR WHEEZING OR SHORTNESS OF BREATH 150 mL 3   albuterol  (VENTOLIN  HFA) 108 (90 Base) MCG/ACT inhaler Inhale 2 puffs into the lungs every 4 (four) hours as needed for wheezing or shortness of breath. 8.5 g 0   BREZTRI  AEROSPHERE 160-9-4.8 MCG/ACT AERO INHALE 2 PUFFS BY MOUTH INTO THE LUNGS IN THE MORNING AND AT BEDTIME 10.7 g 2   budesonide  (PULMICORT ) 0.5 MG/2ML nebulizer solution Take 2 mLs (0.5 mg total) by nebulization 2 (two) times daily. 120 mL 0   butalbital -acetaminophen -caffeine  (FIORICET) 50-325-40 MG tablet TAKE 1 TABLET BY MOUTH EVERY 6 HOURS AS NEEDED FOR HEADACHE 30 tablet 0   gabapentin  (NEURONTIN ) 400 MG capsule Take 1 capsule (400 mg total) by mouth 3 (three) times daily. 270 capsule 3   hydrOXYzine  (VISTARIL ) 25 MG capsule Take 1 capsule (25 mg total) by mouth at bedtime. 90 capsule 1   levothyroxine  (SYNTHROID ) 100 MCG tablet Take 1 tablet (100 mcg total) by mouth daily before breakfast.  90 tablet 1   losartan (COZAAR) 50 MG tablet Take 1 tablet (50 mg total) by mouth daily. 90 tablet 1   montelukast  (SINGULAIR ) 10 MG tablet Take 1 tablet (10 mg total) by mouth daily. 90 tablet 1   Oxycodone HCl 10 MG TABS Take 10 mg by mouth 3 (three) times daily.     OXYGEN  Inhale 2 L/min into the lungs at bedtime.     polyethylene glycol (MIRALAX / GLYCOLAX) 17 g packet Take 17 g by mouth daily as needed. (Patient taking differently: Take 17 g by mouth daily as needed. TAKES EVERY MORNING W/ COFFEE)     QUEtiapine  (SEROQUEL ) 100 MG tablet Take 2 tablets (200 mg total) by mouth at bedtime. 180 tablet 1   SODIUM FLUORIDE 5000 PPM 1.1 % PSTE Take 1 Application by mouth daily.     No current facility-administered medications for this visit.    Patient confirms/reports the following allergies:  Allergies  Allergen Reactions   Sulfa Antibiotics     Reports had reaction ~20 years ago   Penicillins Rash and Other (See Comments)    Has patient had a PCN reaction causing immediate rash, facial/tongue/throat swelling, SOB or lightheadedness with hypotension: No Has patient had a PCN reaction causing severe rash involving mucus membranes or skin necrosis: No Has patient had a PCN reaction that required hospitalization: No Has patient had a PCN reaction occurring within the last 10 years: Yes If all of the above answers are NO, then may proceed with Cephalosporin use.     No orders of the defined types were  placed in this encounter.   AUTHORIZATION INFORMATION Primary Insurance: 1D#: Group #:  Secondary Insurance: 1D#: Group #:  SCHEDULE INFORMATION: Date: 05/05/2024 Time: Location: [facility] Dr. Marinda

## 2024-03-22 ENCOUNTER — Other Ambulatory Visit: Payer: Self-pay

## 2024-04-02 ENCOUNTER — Other Ambulatory Visit: Payer: Self-pay | Admitting: Family Medicine

## 2024-04-02 ENCOUNTER — Inpatient Hospital Stay: Admission: RE | Admit: 2024-04-02 | Discharge: 2024-04-02 | Attending: Family Medicine | Admitting: Family Medicine

## 2024-04-02 ENCOUNTER — Other Ambulatory Visit

## 2024-04-02 DIAGNOSIS — J432 Centrilobular emphysema: Secondary | ICD-10-CM

## 2024-04-02 DIAGNOSIS — F17210 Nicotine dependence, cigarettes, uncomplicated: Secondary | ICD-10-CM

## 2024-04-02 DIAGNOSIS — I10 Essential (primary) hypertension: Secondary | ICD-10-CM

## 2024-04-02 DIAGNOSIS — Z1231 Encounter for screening mammogram for malignant neoplasm of breast: Secondary | ICD-10-CM

## 2024-04-02 DIAGNOSIS — E039 Hypothyroidism, unspecified: Secondary | ICD-10-CM

## 2024-04-02 MED ORDER — VARENICLINE TARTRATE (STARTER) 0.5 MG X 11 & 1 MG X 42 PO TBPK: ORAL_TABLET | ORAL | 0 refills | Status: DC

## 2024-04-02 NOTE — Progress Notes (Signed)
 04/02/2024 Name: Audrey Campbell MRN: [id number] DOB: 1956/03/28  Chief Complaint  Patient presents with   Medication Management   Medication Assistance    Audrey Campbell is a 68 y.o. year old female who presented for a telephone visit.   They were referred to the pharmacist by their PCP for assistance in managing medication access.      Subjective:   Care Team: Primary Care Provider: Edman Marsa PARAS, DO ; Next Scheduled Visit: 04/13/2024  Medication Access/Adherence  Current Pharmacy:  [facility] - [place], [place] - [address] [place] [address] Phone: [phone] Fax: [phone]  [facility] - [place], [place] - [address] [facility] [address] [facility] [place]. [address] [employer] [address] [place] [address] Phone: [phone] Fax: [phone]  [facility] [address] [place] [address] Phone: [phone] Fax: [phone]   Patient reports affordability concerns with their medications: No  Patient reports access/transportation concerns to their pharmacy: No  Patient reports adherence concerns with their medications:  No     COPD:   Current medications: Breztri  inhaler - 2 puffs into lungs twice daily Albuterol  nebulizer solution/albuterol  inhaler as needed   Confirms using Breztri  inhaler - 2 puffs each morning and 2 puffs every evening as directed and rinsing out her mouth after each use   Current medication access support:  Previously enrolled in Breztri  patient assistance program through [employer] - Patient 6 Breztri  inhaler remaining     Tobacco Abuse: Reports has decreased to smoking ~5 cigarettes/day Previous therapies tried: gum, lozenge - did not like the taste, nicotine  patches, Chantix  Motivation: "I want to breathe more than I want to smoke"; bad taste of cigarettes Triggers: wanting to hold cigarette; being around others who  are smoking Today reports interested in trying Chantix  again and setting quit date once she starts  Objective:  Lab Results  Component Value Date   CREATININE 0.81 03/06/2024   BUN 19 03/06/2024   NA 137 03/06/2024   K 4.2 03/06/2024   CL 102 03/06/2024   CO2 27 03/06/2024    Lab Results  Component Value Date   CHOL 183 04/07/2023   HDL 55 04/07/2023   LDLCALC 112 (H) 04/07/2023   TRIG 73 04/07/2023   CHOLHDL 3.3 04/07/2023   BP Readings from Last 3 Encounters:  03/15/24 122/70  03/12/24 134/72  03/06/24 (!) 161/83    Pulse Readings from Last 3 Encounters:  03/15/24 84  03/06/24 72  12/26/23 81    Medications Reviewed Today     Reviewed by Audrey Campbell, RPH-CPP (Pharmacist) on 04/02/24 at 1218  Med List Status: <None>   Medication Order Taking? Sig Documenting Provider Last Dose Status Informant  albuterol  (PROVENTIL ) (2.5 MG/3ML) 0.083% nebulizer solution [id number] Yes USE 1 VIAL BY NEBULIZATION EVERY 4 HOURSAS NEEDED FOR WHEEZING OR SHORTNESS OF BREATH Karamalegos, Marsa PARAS, DO  Active Self  albuterol  (VENTOLIN  HFA) 108 (90 Base) MCG/ACT inhaler [id number] Yes Inhale 2 puffs into the lungs every 4 (four) hours as needed for wheezing or shortness of breath. Edman Marsa PARAS, DO  Active   BREZTRI  AEROSPHERE 160-9-4.8 MCG/ACT AERO [id number] Yes INHALE 2 PUFFS BY MOUTH INTO THE LUNGS IN THE MORNING AND AT BEDTIME Daphane Rosella, NP  Active Self    Discontinued 04/02/24 1210   butalbital -acetaminophen -caffeine  (FIORICET) 50-325-40 MG tablet [id number] Yes TAKE 1 TABLET BY MOUTH EVERY 6 HOURS AS NEEDED FOR HEADACHE Edman Marsa  J, DO  Active Self  gabapentin  (NEURONTIN ) 400 MG capsule [id number] Yes Take 1 capsule (400 mg total) by mouth 3 (three) times daily. Edman Marsa PARAS, DO  Active Self  hydrOXYzine  (VISTARIL ) 25 MG capsule [id number]  Take 1 capsule (25 mg total) by mouth at bedtime. Edman Marsa PARAS, DO  Active   levothyroxine   (SYNTHROID ) 100 MCG tablet [id number] Yes Take 1 tablet (100 mcg total) by mouth daily before breakfast. Edman Marsa PARAS, DO  Active   losartan  (COZAAR ) 50 MG tablet [id number] Yes Take 1 tablet (50 mg total) by mouth daily. Edman Marsa PARAS, DO  Active   montelukast  (SINGULAIR ) 10 MG tablet [id number] Yes Take 1 tablet (10 mg total) by mouth daily. Edman Marsa PARAS, DO  Active   Oxycodone  HCl 10 MG TABS [id number] Yes Take 10 mg by mouth 3 (three) times daily. [provider]  Active   OXYGEN  [id number]  Inhale 2 L/min into the lungs at bedtime. [provider]  Active   polyethylene glycol (MIRALAX  / GLYCOLAX ) 17 g packet [id number]  Take 17 g by mouth daily as needed.  Patient taking differently: Take 17 g by mouth daily as needed. TAKES EVERY MORNING W/ COFFEE   [provider]  Active Self  QUEtiapine  (SEROQUEL ) 100 MG tablet [id number] Yes Take 2 tablets (200 mg total) by mouth at bedtime. Edman Marsa PARAS, DO  Active   SODIUM FLUORIDE 5000 PPM 1.1 % PSTE [id number]  Take 1 Application by mouth daily. [provider]  Active Self              Assessment/Plan:   Comprehensive medication review performed; medication list updated in electronic medical record  - Counsel patient on increased risk of dizziness/sedation with CNS depressant medications including oxycodone , hydroxyzine  and Fioricet Patient verbalizes understanding. Reports uses oxycodone  and Fioricet each as needed Confirms will use caution to avoid risk of falls Reports oxycodone  managed for her by [facility] for plantar fasciitis Encourage patient to use hydroxyzine  only as needed and also observe for improvement in dry mouth  Encourage patient to consider using weekly pillbox and daily phone alarms to aid with medication adherence   COPD: - Reviewed importance of rinsing mouth out after each use of Breztri  - Meets financial criteria for Breztri   patient assistance program through [employer]. Will collaborate with provider, CPhT, and patient to pursue assistance.     Tobacco Abuse - Provided motivational interviewing to assess tobacco use and strategies for reduction - Provided information on 1 800 QUIT NOW support program - Will collaborate with PCP regarding request from patient to try Chantix  again Counsel patient on plan to start varenicline  0.5 mg daily for 3 days, then 0.5 mg twice daily for 4 days, then 1 mg twice daily thereafter. Counseled to take with food to reduce risk of GI adverse effects. Patient to stop therapy and contact provider if change in mood.    Follow Up Plan: Clinical Pharmacist will follow up with patient by telephone on 05/14/2024 at 11:00 AM      Sharyle Sia, PharmD, JAQUELINE, CPP Clinical Pharmacist [facility] [phone]

## 2024-04-02 NOTE — Patient Instructions (Signed)
 Goals Addressed             This Visit's Progress    Pharmacy Goals       Please watch the mail for an envelope from [facility] containing the patient assistance program application. Please complete this application and bring to office to have it faxed back to Attention: Suzen Mall at Fax # [phone] along with a copy of your Medicare Part D prescription card and a copy of your proof of income document.  If you need to call Suzen, you can reach her at [phone].  Please check your home blood pressure, keep a log of the results and bring this with you to your medical appointments  Please consider using a weekly pillbox for organizing your medications  Please consider calling the [place] Quitline again.  The [place] Quitline phone number is: [phone]  Feel free to call me with any questions or concerns. I look forward to our next call!   Sharyle Sia, PharmD, JAQUELINE, CPP Clinical Pharmacist [facility] [phone]

## 2024-04-05 ENCOUNTER — Telehealth: Payer: Self-pay

## 2024-04-05 NOTE — Telephone Encounter (Signed)
 Received Provider portion PAP application (Breztri ) [employer].

## 2024-04-05 NOTE — Telephone Encounter (Signed)
 PAP: Patient assistance application for Breztri  through [employer] ([employer]) has been mailed to pt's home address on file. Provider portion of application will be faxed to provider's office.

## 2024-04-06 ENCOUNTER — Other Ambulatory Visit

## 2024-04-13 ENCOUNTER — Encounter: Admitting: Family Medicine

## 2024-04-13 ENCOUNTER — Other Ambulatory Visit

## 2024-04-13 DIAGNOSIS — E782 Mixed hyperlipidemia: Secondary | ICD-10-CM

## 2024-04-13 DIAGNOSIS — E039 Hypothyroidism, unspecified: Secondary | ICD-10-CM

## 2024-04-13 DIAGNOSIS — I10 Essential (primary) hypertension: Secondary | ICD-10-CM

## 2024-04-13 DIAGNOSIS — R7309 Other abnormal glucose: Secondary | ICD-10-CM

## 2024-04-13 DIAGNOSIS — F411 Generalized anxiety disorder: Secondary | ICD-10-CM

## 2024-04-13 DIAGNOSIS — J432 Centrilobular emphysema: Secondary | ICD-10-CM

## 2024-04-13 DIAGNOSIS — Z0000 Encounter for general adult medical examination without abnormal findings: Secondary | ICD-10-CM

## 2024-04-16 NOTE — Telephone Encounter (Signed)
 PAP: Application for Breztri has been submitted to [employer] ([employer]), via fax

## 2024-04-17 ENCOUNTER — Other Ambulatory Visit: Payer: Self-pay | Admitting: Family Medicine

## 2024-04-17 DIAGNOSIS — J432 Centrilobular emphysema: Secondary | ICD-10-CM

## 2024-04-20 NOTE — Telephone Encounter (Signed)
 PAP: Patient assistance application for Breztri  has been approved by PAP Companies: [employer] from 04/20/2024 to 04/28/2025. Medication should be delivered to PAP Delivery: Home. For further shipping updates, please contact [employer] ([employer]) at [phone]. Patient ID is: [id number]

## 2024-04-21 NOTE — Telephone Encounter (Signed)
 Requested Prescriptions  Pending Prescriptions Disp Refills   albuterol  (VENTOLIN  HFA) 108 (90 Base) MCG/ACT inhaler [Pharmacy Med Name: ALBUTEROL  SULFATE HFA 108 (90 BASE)] 8.5 g 0    Sig: INHALE 2 PUFFS INTO THE LUNGS EVERY 4 HOURS AS NEEDED FOR WHEEZING OR SHORTNESS OF BREATH     Pulmonology:  Beta Agonists 2 Passed - 04/21/2024  9:40 AM      Passed - Last BP in normal range    BP Readings from Last 1 Encounters:  03/15/24 122/70         Passed - Last Heart Rate in normal range    Pulse Readings from Last 1 Encounters:  03/15/24 84         Passed - Valid encounter within last 12 months    Recent Outpatient Visits           1 month ago Centrilobular emphysema [facility])   [place] [facility] [place], Marsa PARAS, DO   3 months ago Unintentional weight loss   [place] [facility] Edman Marsa PARAS, DO   6 months ago Centrilobular emphysema [facility])   [place] [facility], Marsa PARAS, [place]

## 2024-05-04 ENCOUNTER — Ambulatory Visit: Admitting: Internal Medicine

## 2024-05-04 ENCOUNTER — Telehealth: Payer: Self-pay

## 2024-05-04 NOTE — Telephone Encounter (Signed)
 Copied from CRM [id number]. Topic: General - Call Back - No Documentation >> May 04, 2024  8:52 AM Amy B wrote: Reason for CRM: Patient returned call from [facility] regarding scheduling conflict.  Please call patient back [phone]

## 2024-05-04 NOTE — Telephone Encounter (Signed)
 Spoke with patient, appointment moved to dr Edman for friday

## 2024-05-04 NOTE — Telephone Encounter (Signed)
 Patient called and left a message stating that she wanted to cancel her procedure. I then called endo unit and spoke to Trish to cancel. Afterwards I called the patient and left her a voicemail letting her know that I had cancelled her procedure and if she needed to reschedule, to please call me back.

## 2024-05-05 ENCOUNTER — Ambulatory Visit: Admission: RE | Admit: 2024-05-05 | Admitting: General Surgery

## 2024-05-05 ENCOUNTER — Encounter: Admission: RE | Payer: Self-pay | Source: Home / Self Care

## 2024-05-05 SURGERY — COLONOSCOPY
Anesthesia: General

## 2024-05-07 ENCOUNTER — Encounter: Payer: Self-pay | Admitting: Family Medicine

## 2024-05-07 ENCOUNTER — Ambulatory Visit (INDEPENDENT_AMBULATORY_CARE_PROVIDER_SITE_OTHER): Admitting: Family Medicine

## 2024-05-07 VITALS — BP 130/78 | HR 80 | Ht 64.5 in | Wt 151.0 lb

## 2024-05-07 DIAGNOSIS — E039 Hypothyroidism, unspecified: Secondary | ICD-10-CM

## 2024-05-07 DIAGNOSIS — R7309 Other abnormal glucose: Secondary | ICD-10-CM | POA: Diagnosis not present

## 2024-05-07 DIAGNOSIS — J432 Centrilobular emphysema: Secondary | ICD-10-CM | POA: Diagnosis not present

## 2024-05-07 DIAGNOSIS — F3341 Major depressive disorder, recurrent, in partial remission: Secondary | ICD-10-CM

## 2024-05-07 DIAGNOSIS — E782 Mixed hyperlipidemia: Secondary | ICD-10-CM | POA: Diagnosis not present

## 2024-05-07 DIAGNOSIS — I10 Essential (primary) hypertension: Secondary | ICD-10-CM | POA: Diagnosis not present

## 2024-05-07 DIAGNOSIS — F411 Generalized anxiety disorder: Secondary | ICD-10-CM | POA: Diagnosis not present

## 2024-05-07 DIAGNOSIS — F5104 Psychophysiologic insomnia: Secondary | ICD-10-CM

## 2024-05-07 DIAGNOSIS — F17210 Nicotine dependence, cigarettes, uncomplicated: Secondary | ICD-10-CM | POA: Diagnosis not present

## 2024-05-07 DIAGNOSIS — Z1211 Encounter for screening for malignant neoplasm of colon: Secondary | ICD-10-CM | POA: Diagnosis not present

## 2024-05-07 MED ORDER — QUETIAPINE FUMARATE 100 MG PO TABS: 250.0000 mg | ORAL_TABLET | Freq: Every day | ORAL | 1 refills | Status: AC

## 2024-05-07 MED ORDER — BUPROPION HCL ER (SR) 150 MG PO TB12: 150.0000 mg | ORAL_TABLET | Freq: Two times a day (BID) | ORAL | 0 refills | Status: AC

## 2024-05-07 MED ORDER — HYDROXYZINE PAMOATE 25 MG PO CAPS: 25.0000 mg | ORAL_CAPSULE | Freq: Every day | ORAL | 1 refills | Status: AC

## 2024-05-07 MED ORDER — ALBUTEROL SULFATE HFA 108 (90 BASE) MCG/ACT IN AERS: 2.0000 | INHALATION_SPRAY | RESPIRATORY_TRACT | 0 refills | Status: AC | PRN

## 2024-05-07 MED ORDER — LEVOTHYROXINE SODIUM 100 MCG PO TABS: 100.0000 ug | ORAL_TABLET | Freq: Every day | ORAL | 1 refills | Status: AC

## 2024-05-07 NOTE — Patient Instructions (Addendum)
 Thank you for coming to the office today.  Refills sent  Labs ordered  For your smoking cessation, here is the plan: - Start Wellbutrin  150mg  tablets - take 1 tab daily for 3 days - Then, start taking 1 tab TWICE daily (about every 12 hours). Continue this for about 7 weeks. - To quit smoking - wait to start this treatment until you are mentally ready to quit. - Choose a quit date in the future. Start taking the medicine about 1-2 weeks away from your quit date. Reduce the number of cigarettes daily until you eventually QUIT completely on your quit date. Continue taking the Wellbutrin  twice daily for total 7 weeks, we may continue for longer.  Dose increase Seroquel  Quetapine 100mg  tablets, now take 2 and a half = 250 nightly  Colon Cancer Screening: Ordered the Cologuard (home kit) test for colon cancer screening. Stay tuned for further updates.  It will be shipped to you directly. If not received in 2-4 weeks, call us  or the company.   If you send it back and no results are received in 2-4 weeks, call us  or the company as well!   Colon Cancer Screening: - For all adults age 10+ routine colon cancer screening is highly recommended.     - Recent guidelines from American Cancer Society recommend starting age of 54 - Early detection of colon cancer is important, because often there are no warning signs or symptoms, also if found early usually it can be cured. Late stage is hard to treat.   - If Cologuard is NEGATIVE, then it is good for 3 years before next due - If Cologuard is POSITIVE, then it is strongly advised to get a Colonoscopy, which allows the GI doctor to locate the source of the cancer or polyp (even very early stage) and treat it by removing it. ------------------------- Follow instructions to collect sample, you may call the company for any help or questions, 24/7 telephone support at [phone].   Please schedule a Follow-up Appointment to: Return in about 6 months  (around 11/04/2024) for 6 month.  If you have any other questions or concerns, please feel free to call the office or send a message through MyChart. You may also schedule an earlier appointment if necessary.  Additionally, you may be receiving a survey about your experience at our office within a few days to 1 week by e-mail or mail. We value your feedback.  Marsa Officer, DO [facility], [place]

## 2024-05-07 NOTE — Progress Notes (Signed)
 "  Subjective:    Patient ID: Audrey Campbell, female    DOB: Oct 13, 1955, 69 y.o.   MRN: [id number]  Audrey Campbell is a 69 y.o. female presenting on 05/07/2024 for Medical Management of Chronic Issues   HPI  Discussed the use of AI scribe software for clinical note transcription with the patient, who gave verbal consent to proceed.  History of Present Illness   Audrey Campbell is a 69 year old female who presents for medication management and smoking cessation.  Insomnia - Quetiapine  used for sleep, initially at 200 mg nightly - Increased dose to 250 mg nightly over the past couple of weeks by taking an additional half tablet of 100 mg pills - Improved sleep with increased dose - Hydroxyzine  25 mg taken at bedtime  Tobacco use and smoking cessation - Currently smokes one pack of cigarettes every three days - Previously able to stretch to one pack every four days - History of successful smoking cessation for seven months with Chantix  in the past - Recent attempt to use Chantix  discontinued due to severe cold chills and feeling 'weird' - Previous trial of Wellbutrin  for smoking cessation in 2021  Unintentional weight loss - Concerned about weight loss - Currently taking levothyroxine  for thyroid  condition - Tends to lose weight when smoking and gained weight during previous seven-month smoking cessation period  Colorectal cancer screening - No prior colonoscopy due to difficulty with preparation process - Considering alternative screening methods - No family history of colon cancer  Respiratory symptoms - Albuterol  inhaler available for use       Recurrent Depression, Chronic / Anxiety / Insomnia Stable problems Continues on Quetiapine  nightly, 150mg , asking about dose inc again to prior dose. Off Clonazepam    HTN On Amlodipine  10mg  daily   Osteoarthritis multiple joints Using topical voltaren  generic diclofenac    Elevated A1c  Hypothyroidism Levothyroxine   daily      05/07/2024   11:50 PM 03/15/2024    3:10 PM 03/12/2024   10:44 AM  Depression screen PHQ 2/9  Decreased Interest 0 0 0  Down, Depressed, Hopeless 0 0 0  PHQ - 2 Score 0 0 0  Altered sleeping 1 1 0  Tired, decreased energy 1 0 0  Change in appetite 0 1 0  Feeling bad or failure about yourself  0 0 0  Trouble concentrating 0 0 0  Moving slowly or fidgety/restless 0 0 0  Suicidal thoughts 0 0 0  PHQ-9 Score 2 2 0  Difficult doing work/chores Somewhat difficult Somewhat difficult Not difficult at all       12/26/2023   10:46 AM 10/15/2023    9:38 AM 09/26/2022   10:45 AM 03/28/2022   10:55 AM  GAD 7 : Generalized Anxiety Score  Nervous, Anxious, on Edge 0 0 0 0  Control/stop worrying 0 0 0 1  Worry too much - different things 0 0 0 1  Trouble relaxing 0 0 0 1  Restless 0 0 0 0  Easily annoyed or irritable 0 0 0 1  Afraid - awful might happen 0 0 0 1  Total GAD 7 Score 0 0 0 5  Anxiety Difficulty  Not difficult at all  Not difficult at all    Social History[1]  Review of Systems Per HPI unless specifically indicated above     Objective:    BP 130/78 (BP Location: Left Arm, Patient Position: Sitting, Cuff Size: Normal)   Pulse 80   Ht  5' 4.5 (1.638 m)   Wt 151 lb (68.5 kg)   SpO2 94%   BMI 25.52 kg/m   Wt Readings from Last 3 Encounters:  05/07/24 151 lb (68.5 kg)  03/15/24 150 lb (68 kg)  03/12/24 151 lb 6.4 oz (68.7 kg)    Physical Exam Vitals and nursing note reviewed.  Constitutional:      General: She is not in acute distress.    Appearance: She is well-developed. She is not diaphoretic.     Comments: Well-appearing, comfortable, cooperative  HENT:     Head: Normocephalic and atraumatic.  Eyes:     General:        Right eye: No discharge.        Left eye: No discharge.     Conjunctiva/sclera: Conjunctivae normal.  Neck:     Thyroid : No thyromegaly.  Cardiovascular:     Rate and Rhythm: Normal rate and regular rhythm.     Heart  sounds: Normal heart sounds. No murmur heard. Pulmonary:     Effort: Pulmonary effort is normal. No respiratory distress.     Breath sounds: Wheezing present. No rales.  Musculoskeletal:        General: Normal range of motion.     Cervical back: Normal range of motion and neck supple.  Lymphadenopathy:     Cervical: No cervical adenopathy.  Skin:    General: Skin is warm and dry.     Findings: No erythema or rash.  Neurological:     Mental Status: She is alert and oriented to person, place, and time.  Psychiatric:        Behavior: Behavior normal.     Comments: Well groomed, good eye contact, normal speech and thoughts     Results for orders placed or performed in visit on 05/07/24  CBC with Differential/Platelet   Collection Time: 05/07/24 11:52 AM  Result Value Ref Range   WBC 6.2 3.8 - 10.8 Thousand/uL   RBC 4.47 3.80 - 5.10 Million/uL   Hemoglobin 12.2 11.7 - 15.5 g/dL   HCT 61.3 64.0 - 53.9 %   MCV 86.4 81.4 - 101.7 fL   MCH 27.3 27.0 - 33.0 pg   MCHC 31.6 31.6 - 35.4 g/dL   RDW 85.1 88.9 - 84.9 %   Platelets 239 140 - 400 Thousand/uL   MPV 10.7 7.5 - 12.5 fL   Neutro Abs 3,342 1,500 - 7,800 cells/uL   Absolute Lymphocytes 2,213 850 - 3,900 cells/uL   Absolute Monocytes 440 200 - 950 cells/uL   Eosinophils Absolute 143 15 - 500 cells/uL   Basophils Absolute 62 0 - 200 cells/uL   Neutrophils Relative % 53.9 %   Total Lymphocyte 35.7 %   Monocytes Relative 7.1 %   Eosinophils Relative 2.3 %   Basophils Relative 1.0 %      Assessment & Plan:   Problem List Items Addressed This Visit     Centrilobular emphysema (HCC) - Primary   Relevant Medications   albuterol  (VENTOLIN  HFA) 108 (90 Base) MCG/ACT inhaler   Other Relevant Orders   Comprehensive metabolic panel with GFR   Elevated hemoglobin A1c   Relevant Orders   Hemoglobin A1c   Essential hypertension   Relevant Orders   Lipid panel   CBC with Differential/Platelet (Completed)   Comprehensive metabolic  panel with GFR   GAD (generalized anxiety disorder)   Relevant Medications   buPROPion  (WELLBUTRIN  SR) 150 MG 12 hr tablet   hydrOXYzine  (VISTARIL ) 25 MG capsule  Hypothyroidism   Relevant Medications   levothyroxine  (SYNTHROID ) 100 MCG tablet   Other Relevant Orders   TSH   T4, free   Major depressive disorder, recurrent, in partial remission   Relevant Medications   buPROPion  (WELLBUTRIN  SR) 150 MG 12 hr tablet   hydrOXYzine  (VISTARIL ) 25 MG capsule   Mixed hyperlipidemia   Relevant Orders   TSH   Lipid panel   T4, free   Psychophysiological insomnia   Relevant Medications   QUEtiapine  (SEROQUEL ) 100 MG tablet   hydrOXYzine  (VISTARIL ) 25 MG capsule   Other Visit Diagnoses       Cigarette nicotine  dependence without complication       Relevant Medications   buPROPion  (WELLBUTRIN  SR) 150 MG 12 hr tablet     Screening for colon cancer       Relevant Orders   Cologuard        Psychophysiologic insomnia Recent dose inc from 200 to Quetiapine  250 mg effective for sleep. - Continue quetiapine  250 mg at bedtime using two and a half 100 mg tablets.  Centrilobular emphysema Mild wheezing noted. Smoking cessation expected to improve symptoms. On Breztri  - Continue albuterol  inhaler as needed. - Encouraged smoking cessation.  Nicotine  dependence, cigarettes Previous Chantix  discontinued due to adverse effects, in past she was successful in quitting. Wellbutrin  to be retried for smoking cessation. Smoking cessation may lead to weight gain. - Prescribed Wellbutrin  SR 150 with instructions to start with one tablet daily for three days, then increase to twice daily. - Advised to plan smoking cessation 10-14 days after starting Wellbutrin . - Encouraged gradual reduction of cigarette consumption.  Hyperlipidemia Check lab Not on Statin  Hypothyroidism Weight loss may be related to smoking or thyroid  function. - Ordered thyroid  function tests as part of the full blood  panel. - Continue levothyroxine  100mcg with refills provided.  Generalized anxiety disorder Major Depression recurrent partial remission - Continue hydroxyzine  25 mg at bedtime.  General Health Maintenance Discussed colon cancer screening options. Previous colonoscopy normal. Cologuard considered suitable alternative. - Ordered Cologuard test for colon cancer screening. - Discussed shingles vaccination; she declined. - Scheduled follow-up in six months.        Orders Placed This Encounter  Procedures   TSH   Lipid panel    Has the patient fasted?:   Yes   Hemoglobin A1c   CBC with Differential/Platelet   Comprehensive metabolic panel with GFR    Has the patient fasted?:   Yes   T4, free   Cologuard    Meds ordered this encounter  Medications   QUEtiapine  (SEROQUEL ) 100 MG tablet    Sig: Take 2.5 tablets (250 mg total) by mouth at bedtime.    Dispense:  225 tablet    Refill:  1    Dose increase   buPROPion  (WELLBUTRIN  SR) 150 MG 12 hr tablet    Sig: Take 1 tablet (150 mg total) by mouth 2 (two) times daily. For smoking cessation    Dispense:  180 tablet    Refill:  0   albuterol  (VENTOLIN  HFA) 108 (90 Base) MCG/ACT inhaler    Sig: Inhale 2 puffs into the lungs every 4 (four) hours as needed for wheezing or shortness of breath.    Dispense:  8.5 g    Refill:  0   hydrOXYzine  (VISTARIL ) 25 MG capsule    Sig: Take 1 capsule (25 mg total) by mouth at bedtime.    Dispense:  90 capsule    Refill:  1    Upgrade to 90 day + additional refills for future   levothyroxine  (SYNTHROID ) 100 MCG tablet    Sig: Take 1 tablet (100 mcg total) by mouth daily before breakfast.    Dispense:  90 tablet    Refill:  1    Follow up plan: Return in about 6 months (around 11/04/2024) for 6 month follow-up COPD Insomnia, Updates.   Marsa Officer, DO [facility] 05/07/2024, 11:14 AM     [1]  Social History Tobacco Use   Smoking  status: Every Day    Current packs/day: 0.75    Average packs/day: 0.8 packs/day for 52.0 years (39.0 ttl pk-yrs)    Types: Cigarettes    Start date: 04/29/1972   Smokeless tobacco: Never  Vaping Use   Vaping status: Never Used  Substance Use Topics   Alcohol use: No   Drug use: Yes    Types: Marijuana   "

## 2024-05-08 LAB — CBC WITH DIFFERENTIAL/PLATELET
Absolute Lymphocytes: 2213 {cells}/uL (ref 850–3900)
Absolute Monocytes: 440 {cells}/uL (ref 200–950)
Basophils Absolute: 62 {cells}/uL (ref 0–200)
Basophils Relative: 1 %
Eosinophils Absolute: 143 {cells}/uL (ref 15–500)
Eosinophils Relative: 2.3 %
HCT: 38.6 % (ref 35.9–46.0)
Hemoglobin: 12.2 g/dL (ref 11.7–15.5)
MCH: 27.3 pg (ref 27.0–33.0)
MCHC: 31.6 g/dL (ref 31.6–35.4)
MCV: 86.4 fL (ref 81.4–101.7)
MPV: 10.7 fL (ref 7.5–12.5)
Monocytes Relative: 7.1 %
Neutro Abs: 3342 {cells}/uL (ref 1500–7800)
Neutrophils Relative %: 53.9 %
Platelets: 239 Thousand/uL (ref 140–400)
RBC: 4.47 Million/uL (ref 3.80–5.10)
RDW: 14.8 % (ref 11.0–15.0)
Total Lymphocyte: 35.7 %
WBC: 6.2 Thousand/uL (ref 3.8–10.8)

## 2024-05-08 LAB — COMPREHENSIVE METABOLIC PANEL WITH GFR
AG Ratio: 1.5 (calc) (ref 1.0–2.5)
ALT: 9 U/L (ref 6–29)
AST: 14 U/L (ref 10–35)
Albumin: 4.3 g/dL (ref 3.6–5.1)
Alkaline phosphatase (APISO): 93 U/L (ref 37–153)
BUN: 10 mg/dL (ref 7–25)
CO2: 32 mmol/L (ref 20–32)
Calcium: 9.1 mg/dL (ref 8.6–10.4)
Chloride: 95 mmol/L — ABNORMAL LOW (ref 98–110)
Creat: 0.63 mg/dL (ref 0.50–1.05)
Globulin: 2.8 g/dL (ref 1.9–3.7)
Glucose, Bld: 86 mg/dL (ref 65–99)
Potassium: 4.5 mmol/L (ref 3.5–5.3)
Sodium: 134 mmol/L — ABNORMAL LOW (ref 135–146)
Total Bilirubin: 0.3 mg/dL (ref 0.2–1.2)
Total Protein: 7.1 g/dL (ref 6.1–8.1)
eGFR: 96 mL/min/1.73m2

## 2024-05-08 LAB — LIPID PANEL
Cholesterol: 188 mg/dL
HDL: 60 mg/dL
LDL Cholesterol (Calc): 107 mg/dL — ABNORMAL HIGH
Non-HDL Cholesterol (Calc): 128 mg/dL
Total CHOL/HDL Ratio: 3.1 (calc)
Triglycerides: 109 mg/dL

## 2024-05-08 LAB — HEMOGLOBIN A1C
Hgb A1c MFr Bld: 5.8 % — ABNORMAL HIGH
Mean Plasma Glucose: 120 mg/dL
eAG (mmol/L): 6.6 mmol/L

## 2024-05-08 LAB — TSH: TSH: 1.14 m[IU]/L (ref 0.40–4.50)

## 2024-05-08 LAB — T4, FREE: Free T4: 1.3 ng/dL (ref 0.8–1.8)

## 2024-05-13 ENCOUNTER — Other Ambulatory Visit: Payer: Self-pay | Admitting: Family Medicine

## 2024-05-13 DIAGNOSIS — G2581 Restless legs syndrome: Secondary | ICD-10-CM

## 2024-05-14 ENCOUNTER — Other Ambulatory Visit: Admitting: Pharmacist

## 2024-05-14 DIAGNOSIS — J432 Centrilobular emphysema: Secondary | ICD-10-CM

## 2024-05-14 DIAGNOSIS — F17210 Nicotine dependence, cigarettes, uncomplicated: Secondary | ICD-10-CM

## 2024-05-14 NOTE — Telephone Encounter (Signed)
 Requested Prescriptions  Refused Prescriptions Disp Refills   rOPINIRole  (REQUIP ) 0.5 MG tablet [Pharmacy Med Name: ROPINIROLE  HCL 0.5 MG TAB] 30 tablet 0    Sig: TAKE 1 TO 2 TABLETS BY MOUTH AT BEDTIME AS NEEDED FOR RESTLESS LEG SYNDROME     Neurology:  Parkinsonian Agents Passed - 05/14/2024 10:43 AM      Passed - Last BP in normal range    BP Readings from Last 1 Encounters:  05/07/24 130/78         Passed - Last Heart Rate in normal range    Pulse Readings from Last 1 Encounters:  05/07/24 80         Passed - Valid encounter within last 12 months    Recent Outpatient Visits           1 week ago Centrilobular emphysema [facility])   [place] [facility] [place], Marsa PARAS, [place]   2 months ago Centrilobular emphysema [facility])   [place] [facility] Ed[place] Marsa PARAS, [place]   4 months ago Unintentional weight loss   [place] [facility] [place] Marsa PARAS, [place]   7 months ago Centrilobular emphysema [facility])   [place] [facility], Marsa PARAS, [place]

## 2024-05-14 NOTE — Patient Instructions (Signed)
"   Goals Addressed             This Visit's Progress    Pharmacy Goals       If you need to reach out to patient assistance programs regarding refills or to find out the status of your application, you can do so by calling:  [employer] at [phone]  Please check your home blood pressure, keep a log of the results and bring this with you to your medical appointments  Please consider using a weekly pillbox for organizing your medications  Please consider calling the [place] [employer]uit[employer] again.  The [place] [employer]uit[employer] phone number is: [phone]  Feel free to call me with any questions or concerns. I look forward to our next call!   Sharyle Sia, PharmD, JAQUELINE, CPP Clinical Pharmacist [facility] [phone]         "

## 2024-05-14 NOTE — Progress Notes (Signed)
 "  05/14/2024 Name: Audrey Campbell MRN: [id number] DOB: May 04, 1955  Chief Complaint  Patient presents with   Medication Management   Medication Assistance    Audrey Campbell is a 69 y.o. year old female who presented for a telephone visit.   They were referred to the pharmacist by their PCP for assistance in managing medication access.      Subjective:   Care Team: Primary Care Provider: Edman Marsa PARAS, DO ; Next Scheduled Visit: 10/20/2024   Medication Access/Adherence  Current Pharmacy:  [facility] - [place], [place] - [address] [place] [address] Phone: [phone] Fax: [phone]  [facility] - [place], [place] - [address] [facility] [address] [employer] [address] [place] [address] Phone: [phone] Fax: [phone]  [facility] [address] [place] [address] Phone: [phone] Fax: [phone]   Patient reports affordability concerns with their medications: No  Patient reports access/transportation concerns to their pharmacy: No  Patient reports adherence concerns with their medications:  No     COPD:   Current medications: Breztri  inhaler - 2 puffs into lungs twice daily Albuterol  nebulizer solution/albuterol  inhaler as needed   Confirms using Breztri  inhaler - 2 puffs each morning and 2 puffs every evening as directed and rinsing out her mouth after each use   Current medication access support: Enrolled in Breztri  patient assistance program through [employer] through 04/28/2025   Tobacco Abuse: Reports currently smoking ~5-6 cigarettes/day Previous therapies tried: gum, lozenge - did not like the taste, nicotine  patches, Chantix  - did not tolerate (felt weird) Motivation: I want to breathe more than I want to smoke; bad taste of cigarettes Triggers: wanting to hold cigarette; being around others who are  smoking  Today reports tried Chantix  again last month, but did not tolerate well; felt weird while taking  Instead now taking bupropion  SR 150 mg twice daily as prescribed by PCP on 1/9. Reports tolerating well.     Objective:   Lab Results  Component Value Date   CREATININE 0.63 05/07/2024   BUN 10 05/07/2024   NA 134 (L) 05/07/2024   K 4.5 05/07/2024   CL 95 (L) 05/07/2024   CO2 32 05/07/2024     Medications Reviewed Today     Reviewed by Alana Sharyle LABOR, RPH-CPP (Pharmacist) on 05/14/24 at 1507  Med List Status: <None>   Medication Order Taking? Sig Documenting Provider Last Dose Status Informant  albuterol  (PROVENTIL ) (2.5 MG/3ML) 0.083% nebulizer solution [id number] Yes USE 1 VIAL BY NEBULIZATION EVERY 4 HOURSAS NEEDED FOR WHEEZING OR SHORTNESS OF BREATH Karamalegos, Marsa PARAS, DO  Active Self  albuterol  (VENTOLIN  HFA) 108 (90 Base) MCG/ACT inhaler [id number] Yes Inhale 2 puffs into the lungs every 4 (four) hours as needed for wheezing or shortness of breath. Edman Marsa PARAS, DO  Active   BREZTRI  AEROSPHERE 160-9-4.8 MCG/ACT TERESE [id number] Yes INHALE 2 PUFFS BY MOUTH INTO THE LUNGS IN THE MORNING AND AT BEDTIME Daphane Rosella, NP  Active Self  buPROPion  (WELLBUTRIN  SR) 150 MG 12 hr tablet [id number] Yes Take 1 tablet (150 mg total) by mouth 2 (two) times daily. For smoking cessation Edman Marsa PARAS, DO  Active   butalbital -acetaminophen -caffeine  (FIORICET) 50-325-40 MG tablet [id number] Yes TAKE 1 TABLET BY MOUTH EVERY 6 HOURS AS NEEDED FOR HEADACHE Edman Marsa PARAS, DO  Active Self  gabapentin  (NEURONTIN ) 400 MG capsule [id number] Yes Take 1 capsule (400 mg  total) by mouth 3 (three) times daily. Edman Marsa PARAS, DO  Active Self  hydrOXYzine  (VISTARIL ) 25 MG capsule [id number]  Take 1 capsule (25 mg total) by mouth at bedtime. Edman Marsa PARAS, DO  Active   levothyroxine  (SYNTHROID ) 100 MCG tablet [id number]  Take 1 tablet (100 mcg  total) by mouth daily before breakfast. Edman Marsa PARAS, DO  Active   losartan  (COZAAR ) 50 MG tablet [id number] Yes Take 1 tablet (50 mg total) by mouth daily. Edman Marsa PARAS, DO  Active   montelukast  (SINGULAIR ) 10 MG tablet [id number] Yes Take 1 tablet (10 mg total) by mouth daily. Edman Marsa PARAS, DO  Active   Oxycodone  HCl 10 MG TABS [id number] Yes Take 10 mg by mouth 3 (three) times daily. [provider]  Active   OXYGEN  [id number] Yes Inhale 2 L/min into the lungs at bedtime. [provider]  Active   polyethylene glycol (MIRALAX  / GLYCOLAX ) 17 g packet [id number] Yes Take 17 g by mouth daily as needed.  Patient taking differently: Take 17 g by mouth daily as needed. TAKES EVERY MORNING W/ COFFEE   [provider]  Active Self  QUEtiapine  (SEROQUEL ) 100 MG tablet [id number]  Take 2.5 tablets (250 mg total) by mouth at bedtime. Edman Marsa PARAS, DO  Active   SODIUM FLUORIDE 5000 PPM 1.1 % PSTE [id number] Yes Take 1 Application by mouth daily. [provider]  Active Self              Assessment/Plan:     COPD: - Reviewed importance of rinsing mouth out after each use of Breztri  - Patient to contact [employer] as needed for refills of Breztri  inhaler     Tobacco Abuse - Provided motivational interviewing to assess tobacco use and strategies for reduction - Patient to follow up to schedule due Lung Cancer Screening   Follow Up Plan: Clinical Pharmacist will follow up with patient by telephone on 02/11/2025 at 11:00 AM       Sharyle Sia, PharmD, JAQUELINE, CPP Clinical Pharmacist [facility] [phone]   "

## 2024-05-21 ENCOUNTER — Ambulatory Visit: Payer: Self-pay | Admitting: Family Medicine

## 2024-06-30 ENCOUNTER — Ambulatory Visit

## 2024-10-20 ENCOUNTER — Ambulatory Visit: Admitting: Family Medicine

## 2024-12-31 ENCOUNTER — Ambulatory Visit

## 2025-02-11 ENCOUNTER — Other Ambulatory Visit

## 2025-03-16 ENCOUNTER — Ambulatory Visit
